# Patient Record
Sex: Female | Born: 1935
Health system: Southern US, Community
[De-identification: ages and names within clinical notes are randomized; demographics above are authoritative.]

## PROBLEM LIST (undated history)

## (undated) DIAGNOSIS — G629 Polyneuropathy, unspecified: Secondary | ICD-10-CM

## (undated) DIAGNOSIS — N649 Disorder of breast, unspecified: Secondary | ICD-10-CM

## (undated) DIAGNOSIS — K219 Gastro-esophageal reflux disease without esophagitis: Secondary | ICD-10-CM

## (undated) DIAGNOSIS — E785 Hyperlipidemia, unspecified: Secondary | ICD-10-CM

## (undated) DIAGNOSIS — M1990 Unspecified osteoarthritis, unspecified site: Secondary | ICD-10-CM

## (undated) DIAGNOSIS — C9110 Chronic lymphocytic leukemia of B-cell type not having achieved remission: Secondary | ICD-10-CM

## (undated) DIAGNOSIS — E7800 Pure hypercholesterolemia, unspecified: Secondary | ICD-10-CM

## (undated) DIAGNOSIS — D509 Iron deficiency anemia, unspecified: Secondary | ICD-10-CM

## (undated) DIAGNOSIS — E559 Vitamin D deficiency, unspecified: Secondary | ICD-10-CM

## (undated) DIAGNOSIS — R9439 Abnormal result of other cardiovascular function study: Secondary | ICD-10-CM

## (undated) DIAGNOSIS — N281 Cyst of kidney, acquired: Secondary | ICD-10-CM

## (undated) DIAGNOSIS — K5790 Diverticulosis of intestine, part unspecified, without perforation or abscess without bleeding: Secondary | ICD-10-CM

## (undated) DIAGNOSIS — N289 Disorder of kidney and ureter, unspecified: Secondary | ICD-10-CM

## (undated) DIAGNOSIS — M6780 Other specified disorders of synovium and tendon, unspecified site: Secondary | ICD-10-CM

## (undated) DIAGNOSIS — R3129 Other microscopic hematuria: Secondary | ICD-10-CM

## (undated) DIAGNOSIS — H9190 Unspecified hearing loss, unspecified ear: Secondary | ICD-10-CM

## (undated) DIAGNOSIS — M8580 Other specified disorders of bone density and structure, unspecified site: Secondary | ICD-10-CM

## (undated) DIAGNOSIS — K635 Polyp of colon: Secondary | ICD-10-CM

## (undated) HISTORY — DX: Iron deficiency anemia, unspecified: D50.9

## (undated) HISTORY — DX: Pure hypercholesterolemia, unspecified: E78.00

## (undated) HISTORY — DX: Abnormal result of other cardiovascular function study: R94.39

## (undated) HISTORY — DX: Cyst of kidney, acquired: N28.1

## (undated) HISTORY — DX: Other specified disorders of synovium and tendon, unspecified site: M67.80

## (undated) HISTORY — DX: Other specified disorders of bone density and structure, unspecified site: M85.80

## (undated) HISTORY — DX: Hyperlipidemia, unspecified: E78.5

## (undated) HISTORY — DX: Other microscopic hematuria: R31.29

## (undated) HISTORY — DX: Polyp of colon: K63.5

## (undated) HISTORY — DX: Polyneuropathy, unspecified: G62.9

## (undated) HISTORY — DX: Disorder of kidney and ureter, unspecified: N28.9

## (undated) HISTORY — DX: Vitamin D deficiency, unspecified: E55.9

## (undated) HISTORY — DX: Diverticulosis of intestine, part unspecified, without perforation or abscess without bleeding: K57.90

## (undated) HISTORY — DX: Unspecified osteoarthritis, unspecified site: M19.90

## (undated) HISTORY — DX: Gastro-esophageal reflux disease without esophagitis: K21.9

## (undated) HISTORY — DX: Unspecified hearing loss, unspecified ear: H91.90

## (undated) HISTORY — DX: Disorder of breast, unspecified: N64.9

## (undated) HISTORY — DX: Chronic lymphocytic leukemia of B-cell type not having achieved remission: C91.10

---

## 1999-03-31 ENCOUNTER — Ambulatory Visit (HOSPITAL_COMMUNITY): Admission: RE | Admit: 1999-03-31 | Discharge: 1999-03-31 | Payer: Self-pay | Admitting: Gastroenterology

## 2009-01-26 DIAGNOSIS — R9439 Abnormal result of other cardiovascular function study: Secondary | ICD-10-CM

## 2009-01-26 HISTORY — DX: Abnormal result of other cardiovascular function study: R94.39

## 2009-10-12 DIAGNOSIS — N281 Cyst of kidney, acquired: Secondary | ICD-10-CM

## 2009-10-12 DIAGNOSIS — C9110 Chronic lymphocytic leukemia of B-cell type not having achieved remission: Secondary | ICD-10-CM

## 2009-10-12 HISTORY — DX: Cyst of kidney, acquired: N28.1

## 2009-10-12 HISTORY — DX: Chronic lymphocytic leukemia of B-cell type not having achieved remission: C91.10

## 2010-04-11 ENCOUNTER — Inpatient Hospital Stay (HOSPITAL_COMMUNITY): Admission: RE | Admit: 2010-04-11 | Discharge: 2010-04-14 | Payer: Self-pay | Admitting: Orthopedic Surgery

## 2010-05-14 DIAGNOSIS — I5032 Chronic diastolic (congestive) heart failure: Secondary | ICD-10-CM | POA: Diagnosis present

## 2010-05-14 DIAGNOSIS — D509 Iron deficiency anemia, unspecified: Secondary | ICD-10-CM

## 2010-05-14 HISTORY — DX: Iron deficiency anemia, unspecified: D50.9

## 2010-10-27 LAB — CBC
MCHC: 34.4 g/dL (ref 30.0–36.0)
MCV: 94.1 fL (ref 78.0–100.0)
Platelets: 239 10*3/uL (ref 150–400)
RDW: 13.9 % (ref 11.5–15.5)
WBC: 13.9 10*3/uL — ABNORMAL HIGH (ref 4.0–10.5)

## 2010-10-28 LAB — COMPREHENSIVE METABOLIC PANEL
BUN: 14 mg/dL (ref 6–23)
CO2: 27 mEq/L (ref 19–32)
Chloride: 105 mEq/L (ref 96–112)
Creatinine, Ser: 1.19 mg/dL (ref 0.4–1.2)
GFR calc non Af Amer: 43 mL/min — ABNORMAL LOW (ref >60)
Glucose, Bld: 89 mg/dL (ref 70–99)
Total Bilirubin: 0.8 mg/dL (ref 0.3–1.2)

## 2010-10-28 LAB — BASIC METABOLIC PANEL
CO2: 28 mEq/L (ref 19–32)
Calcium: 8.8 mg/dL (ref 8.4–10.5)
Calcium: 8.9 mg/dL (ref 8.4–10.5)
Chloride: 107 mEq/L (ref 96–112)
GFR calc Af Amer: >60 mL/min (ref >60)
GFR calc Af Amer: >60 mL/min (ref >60)
GFR calc non Af Amer: 56 mL/min — ABNORMAL LOW (ref >60)
Glucose, Bld: 125 mg/dL — ABNORMAL HIGH (ref 70–99)
Potassium: 3.9 mEq/L (ref 3.5–5.1)
Sodium: 139 mEq/L (ref 135–145)
Sodium: 140 mEq/L (ref 135–145)

## 2010-10-28 LAB — CBC
HCT: 27.2 % — ABNORMAL LOW (ref 36.0–46.0)
Hemoglobin: 10.1 g/dL — ABNORMAL LOW (ref 12.0–15.0)
Hemoglobin: 9.2 g/dL — ABNORMAL LOW (ref 12.0–15.0)
MCH: 32.2 pg (ref 26.0–34.0)
MCHC: 34 g/dL (ref 30.0–36.0)
MCV: 94.3 fL (ref 78.0–100.0)
RBC: 2.88 MIL/uL — ABNORMAL LOW (ref 3.87–5.11)
RBC: 3.13 MIL/uL — ABNORMAL LOW (ref 3.87–5.11)
WBC: 12.7 10*3/uL — ABNORMAL HIGH (ref 4.0–10.5)

## 2010-10-28 LAB — URINALYSIS, ROUTINE W REFLEX MICROSCOPIC
Bilirubin Urine: NEGATIVE
Glucose, UA: NEGATIVE mg/dL
Hgb urine dipstick: NEGATIVE
Ketones, ur: NEGATIVE mg/dL
Protein, ur: NEGATIVE mg/dL

## 2010-10-28 LAB — PROTIME-INR
INR: 1 (ref 0.00–1.49)
INR: 1.53 — ABNORMAL HIGH (ref 0.00–1.49)
Prothrombin Time: 13.8 seconds (ref 11.6–15.2)
Prothrombin Time: 18.6 seconds — ABNORMAL HIGH (ref 11.6–15.2)

## 2010-10-28 LAB — ABO/RH: ABO/RH(D): B POS

## 2010-10-28 LAB — APTT: aPTT: 27 seconds (ref 24–37)

## 2011-04-27 ENCOUNTER — Other Ambulatory Visit: Payer: Self-pay | Admitting: Geriatric Medicine

## 2011-04-27 DIAGNOSIS — Z1231 Encounter for screening mammogram for malignant neoplasm of breast: Secondary | ICD-10-CM

## 2011-05-30 ENCOUNTER — Other Ambulatory Visit (HOSPITAL_COMMUNITY)
Admission: RE | Admit: 2011-05-30 | Discharge: 2011-05-30 | Disposition: A | Discharge status: Home / Self Care | Payer: Medicare Other | Source: Ambulatory Visit | Attending: Obstetrics and Gynecology | Admitting: Obstetrics and Gynecology

## 2011-05-30 DIAGNOSIS — Z124 Encounter for screening for malignant neoplasm of cervix: Secondary | ICD-10-CM | POA: Insufficient documentation

## 2011-06-01 ENCOUNTER — Ambulatory Visit
Admission: RE | Admit: 2011-06-01 | Discharge: 2011-06-01 | Disposition: A | Discharge status: Home / Self Care | Payer: Medicare Other | Source: Ambulatory Visit | Attending: Geriatric Medicine | Admitting: Geriatric Medicine

## 2011-06-01 DIAGNOSIS — Z1231 Encounter for screening mammogram for malignant neoplasm of breast: Secondary | ICD-10-CM

## 2011-06-06 ENCOUNTER — Other Ambulatory Visit: Payer: Self-pay | Admitting: Geriatric Medicine

## 2011-06-06 DIAGNOSIS — R928 Other abnormal and inconclusive findings on diagnostic imaging of breast: Secondary | ICD-10-CM

## 2011-06-30 ENCOUNTER — Ambulatory Visit
Admission: RE | Admit: 2011-06-30 | Discharge: 2011-06-30 | Disposition: A | Discharge status: Home / Self Care | Payer: Medicare Other | Source: Ambulatory Visit | Attending: Geriatric Medicine | Admitting: Geriatric Medicine

## 2011-06-30 DIAGNOSIS — R928 Other abnormal and inconclusive findings on diagnostic imaging of breast: Secondary | ICD-10-CM

## 2011-08-20 DIAGNOSIS — B9789 Other viral agents as the cause of diseases classified elsewhere: Secondary | ICD-10-CM | POA: Diagnosis not present

## 2011-09-01 DIAGNOSIS — M1710 Unilateral primary osteoarthritis, unspecified knee: Secondary | ICD-10-CM | POA: Diagnosis not present

## 2011-10-13 DIAGNOSIS — E559 Vitamin D deficiency, unspecified: Secondary | ICD-10-CM

## 2011-10-13 HISTORY — DX: Vitamin D deficiency, unspecified: E55.9

## 2011-10-24 DIAGNOSIS — C9110 Chronic lymphocytic leukemia of B-cell type not having achieved remission: Secondary | ICD-10-CM | POA: Diagnosis not present

## 2011-10-24 DIAGNOSIS — R109 Unspecified abdominal pain: Secondary | ICD-10-CM | POA: Diagnosis not present

## 2011-10-24 DIAGNOSIS — K219 Gastro-esophageal reflux disease without esophagitis: Secondary | ICD-10-CM | POA: Diagnosis not present

## 2011-10-24 DIAGNOSIS — K6289 Other specified diseases of anus and rectum: Secondary | ICD-10-CM | POA: Diagnosis not present

## 2011-10-24 DIAGNOSIS — Z79899 Other long term (current) drug therapy: Secondary | ICD-10-CM | POA: Diagnosis not present

## 2011-10-31 DIAGNOSIS — R5381 Other malaise: Secondary | ICD-10-CM | POA: Diagnosis not present

## 2011-10-31 DIAGNOSIS — IMO0001 Reserved for inherently not codable concepts without codable children: Secondary | ICD-10-CM | POA: Diagnosis not present

## 2011-11-08 DIAGNOSIS — M1710 Unilateral primary osteoarthritis, unspecified knee: Secondary | ICD-10-CM | POA: Diagnosis not present

## 2011-11-27 DIAGNOSIS — Z23 Encounter for immunization: Secondary | ICD-10-CM | POA: Diagnosis not present

## 2011-11-27 DIAGNOSIS — R05 Cough: Secondary | ICD-10-CM | POA: Diagnosis not present

## 2011-11-27 DIAGNOSIS — R079 Chest pain, unspecified: Secondary | ICD-10-CM | POA: Diagnosis not present

## 2011-11-27 DIAGNOSIS — K117 Disturbances of salivary secretion: Secondary | ICD-10-CM | POA: Diagnosis not present

## 2011-11-27 DIAGNOSIS — E7800 Pure hypercholesterolemia, unspecified: Secondary | ICD-10-CM | POA: Diagnosis not present

## 2011-11-27 DIAGNOSIS — R5383 Other fatigue: Secondary | ICD-10-CM | POA: Diagnosis not present

## 2011-11-27 DIAGNOSIS — R002 Palpitations: Secondary | ICD-10-CM | POA: Diagnosis not present

## 2011-12-13 DIAGNOSIS — I4949 Other premature depolarization: Secondary | ICD-10-CM | POA: Diagnosis not present

## 2011-12-13 DIAGNOSIS — E7800 Pure hypercholesterolemia, unspecified: Secondary | ICD-10-CM | POA: Diagnosis not present

## 2011-12-13 DIAGNOSIS — K219 Gastro-esophageal reflux disease without esophagitis: Secondary | ICD-10-CM | POA: Diagnosis not present

## 2011-12-13 DIAGNOSIS — R079 Chest pain, unspecified: Secondary | ICD-10-CM | POA: Diagnosis not present

## 2011-12-19 DIAGNOSIS — I4949 Other premature depolarization: Secondary | ICD-10-CM | POA: Diagnosis not present

## 2011-12-19 DIAGNOSIS — R079 Chest pain, unspecified: Secondary | ICD-10-CM | POA: Diagnosis not present

## 2011-12-21 ENCOUNTER — Other Ambulatory Visit: Payer: Self-pay | Admitting: Interventional Cardiology

## 2011-12-21 ENCOUNTER — Encounter (HOSPITAL_COMMUNITY): Payer: Self-pay | Admitting: Pharmacy Technician

## 2011-12-21 DIAGNOSIS — R079 Chest pain, unspecified: Secondary | ICD-10-CM | POA: Diagnosis not present

## 2011-12-21 DIAGNOSIS — I4949 Other premature depolarization: Secondary | ICD-10-CM | POA: Diagnosis not present

## 2011-12-21 DIAGNOSIS — R9430 Abnormal result of cardiovascular function study, unspecified: Secondary | ICD-10-CM | POA: Diagnosis not present

## 2011-12-25 ENCOUNTER — Encounter (HOSPITAL_COMMUNITY)
Admission: RE | Disposition: A | Discharge status: Home / Self Care | Payer: Self-pay | Source: Ambulatory Visit | Attending: Interventional Cardiology

## 2011-12-25 ENCOUNTER — Ambulatory Visit (HOSPITAL_COMMUNITY)
Admission: RE | Admit: 2011-12-25 | Discharge: 2011-12-25 | Disposition: A | Discharge status: Home / Self Care | Payer: Medicare Other | Source: Ambulatory Visit | Attending: Interventional Cardiology | Admitting: Interventional Cardiology

## 2011-12-25 DIAGNOSIS — R0609 Other forms of dyspnea: Secondary | ICD-10-CM | POA: Insufficient documentation

## 2011-12-25 DIAGNOSIS — K219 Gastro-esophageal reflux disease without esophagitis: Secondary | ICD-10-CM | POA: Diagnosis not present

## 2011-12-25 DIAGNOSIS — R0989 Other specified symptoms and signs involving the circulatory and respiratory systems: Secondary | ICD-10-CM | POA: Insufficient documentation

## 2011-12-25 DIAGNOSIS — E7800 Pure hypercholesterolemia, unspecified: Secondary | ICD-10-CM | POA: Insufficient documentation

## 2011-12-25 DIAGNOSIS — R0789 Other chest pain: Secondary | ICD-10-CM | POA: Diagnosis not present

## 2011-12-25 DIAGNOSIS — I4949 Other premature depolarization: Secondary | ICD-10-CM | POA: Insufficient documentation

## 2011-12-25 DIAGNOSIS — I200 Unstable angina: Secondary | ICD-10-CM | POA: Diagnosis not present

## 2011-12-25 DIAGNOSIS — I209 Angina pectoris, unspecified: Secondary | ICD-10-CM | POA: Diagnosis present

## 2011-12-25 DIAGNOSIS — I2510 Atherosclerotic heart disease of native coronary artery without angina pectoris: Secondary | ICD-10-CM | POA: Insufficient documentation

## 2011-12-25 DIAGNOSIS — R9430 Abnormal result of cardiovascular function study, unspecified: Secondary | ICD-10-CM | POA: Diagnosis not present

## 2011-12-25 HISTORY — PX: LEFT HEART CATHETERIZATION WITH CORONARY ANGIOGRAM: SHX5451

## 2011-12-25 SURGERY — LEFT HEART CATHETERIZATION WITH CORONARY ANGIOGRAM
Anesthesia: LOCAL

## 2011-12-25 MED ORDER — ONDANSETRON HCL 4 MG/2ML IJ SOLN: 4.0000 mg | Freq: Four times a day (QID) | INTRAMUSCULAR | Status: DC | PRN

## 2011-12-25 MED ORDER — SODIUM CHLORIDE 0.9 % IV SOLN
INTRAVENOUS | Status: DC
  Administered 2011-12-25 (×2): 1000 mL via INTRAVENOUS

## 2011-12-25 MED ORDER — HEPARIN (PORCINE) IN NACL 2-0.9 UNIT/ML-% IJ SOLN
INTRAMUSCULAR | Status: AC
  Filled 2011-12-25: qty 2000

## 2011-12-25 MED ORDER — FENTANYL CITRATE 0.05 MG/ML IJ SOLN
INTRAMUSCULAR | Status: AC
  Filled 2011-12-25: qty 2

## 2011-12-25 MED ORDER — SODIUM CHLORIDE 0.9 % IV SOLN: INTRAVENOUS | Status: AC

## 2011-12-25 MED ORDER — NITROGLYCERIN 0.4 MG SL SUBL
0.4000 mg | SUBLINGUAL_TABLET | SUBLINGUAL | Status: DC | PRN
  Filled 2011-12-25: qty 25

## 2011-12-25 MED ORDER — SODIUM CHLORIDE 0.9 % IJ SOLN: 3.0000 mL | INTRAMUSCULAR | Status: DC | PRN

## 2011-12-25 MED ORDER — ASPIRIN 81 MG PO CHEW
324.0000 mg | CHEWABLE_TABLET | ORAL | Status: AC
  Administered 2011-12-25: 324 mg via ORAL
  Filled 2011-12-25: qty 4

## 2011-12-25 MED ORDER — MIDAZOLAM HCL 2 MG/2ML IJ SOLN
INTRAMUSCULAR | Status: AC
  Filled 2011-12-25: qty 2

## 2011-12-25 MED ORDER — PANTOPRAZOLE SODIUM 40 MG PO TBEC: 40.0000 mg | DELAYED_RELEASE_TABLET | Freq: Every day | ORAL | Status: DC

## 2011-12-25 MED ORDER — ATORVASTATIN CALCIUM 10 MG PO TABS: 10.0000 mg | ORAL_TABLET | Freq: Every day | ORAL | Status: DC

## 2011-12-25 MED ORDER — ISOSORBIDE MONONITRATE ER 60 MG PO TB24: 60.0000 mg | ORAL_TABLET | Freq: Every day | ORAL | Status: DC

## 2011-12-25 MED ORDER — LIDOCAINE HCL (PF) 1 % IJ SOLN
INTRAMUSCULAR | Status: AC
  Filled 2011-12-25: qty 30

## 2011-12-25 MED ORDER — ACETAMINOPHEN 325 MG PO TABS: 650.0000 mg | ORAL_TABLET | ORAL | Status: DC | PRN

## 2011-12-25 MED ORDER — DIAZEPAM 5 MG PO TABS
5.0000 mg | ORAL_TABLET | ORAL | Status: AC
  Administered 2011-12-25: 5 mg via ORAL
  Filled 2011-12-25: qty 1

## 2011-12-25 MED ORDER — SODIUM CHLORIDE 0.9 % IJ SOLN: 3.0000 mL | Freq: Two times a day (BID) | INTRAMUSCULAR | Status: DC

## 2011-12-25 MED ORDER — VITAMIN C 500 MG PO TABS
500.0000 mg | ORAL_TABLET | Freq: Every day | ORAL | Status: DC
  Filled 2011-12-25: qty 1

## 2011-12-25 MED ORDER — NITROGLYCERIN 0.2 MG/ML ON CALL CATH LAB
INTRAVENOUS | Status: AC
  Filled 2011-12-25: qty 1

## 2011-12-25 MED ORDER — ASPIRIN EC 325 MG PO TBEC: 325.0000 mg | DELAYED_RELEASE_TABLET | Freq: Every day | ORAL | Status: DC

## 2011-12-25 MED ORDER — ERGOCALCIFEROL 8000 UNIT/ML PO SOLN
2000.0000 [IU] | Freq: Every day | ORAL | Status: DC
  Filled 2011-12-25: qty 0.25

## 2011-12-25 MED ORDER — SODIUM CHLORIDE 0.9 % IV SOLN: 250.0000 mL | INTRAVENOUS | Status: DC | PRN

## 2011-12-25 MED ORDER — HEPARIN SODIUM (PORCINE) 1000 UNIT/ML IJ SOLN
INTRAMUSCULAR | Status: AC
  Filled 2011-12-25: qty 1

## 2011-12-25 NOTE — H&P (Addendum)
He is referred to the admitting history and physical that a scan done from office notes. The patient had a catheterization performed greater than 20 years ago that did not reveal evidence of significant coronary disease at that time. Over the past several months she is had pressure in the chest and dyspnea upon awakening most mornings. Activity aggravates the discomfort. The nuclear study performed demonstrated apical ischemia. Isosorbide mononitrate was started after the nuclear study and has significantly improved her symptoms. She is also use supplemental nitroglycerin with relief. The catheterization is being done to identify the presence of coronary disease and help to guide therapy. The procedure including its risks of stroke, death, heart attack, bleeding, limb ischemia, allergy, kidney impairment, emergency bypass surgery, were discussed in detail with the patient and her husband. She accepts procedure and is willing to proceed to

## 2011-12-25 NOTE — CV Procedure (Signed)
     Diagnostic Cardiac Catheterization Report  Rebecca King  78 y.o.  female [birth year]  Procedure Date:12/25/2011 Referring Physician: Merlene Laughter, MD  Primary Cardiologist:: H.W. Flo Shanks, III, MD   PROCEDURE:  Left heart catheterization with selective coronary angiography, left ventriculogram.  INDICATIONS: There are recurring episodes of chest tightness predominantly in the morning. Isosorbide mononitrate and nitroglycerin partially relieves the discomfort. A recent nuclear study demonstrated antero-apical ischemia .  The risks, benefits, and details of the procedure were explained to the patient.  The patient verbalized understanding and wanted to proceed.  Informed written consent was obtained.  PROCEDURE TECHNIQUE:  After Xylocaine anesthesia a 5 sheath was placed in the right radial artery with a single anterior needle wall stick.   Coronary angiography was done using a 5 [place] A2 multipurpose, JR 4, JL 3.5, and 5 French 3.0 EBU catheters.  Left ventriculography was done using a 5 [place] A2 MP catheter.    CONTRAST:  Total of 140 cc.  COMPLICATIONS:  None.    HEMODYNAMICS:  Aortic pressure was 105/54 mmHg; LV pressure was 107/6 mmHg; LVEDP 11 mm mercury.  There was no gradient between the left ventricle and aorta.    ANGIOGRAPHIC DATA:   The left main coronary artery is widely patent.  The left anterior descending artery is widely patent with luminal irregularities up to 40% in the mid vessel beyond the origin of the first diagonal. The first diagonal is large and contains ostial 60-70% narrowing. The diagonal is large and trifurcates on the anterolateral wall.  The left circumflex artery is moderate in size given origin to 2 marginals. Proximal 50% eccentric narrowing is noted..  The right coronary artery is dominant. Gives origin to twin PDA system. No obstruction is noted in the right coronary.Marland Kitchen  LEFT VENTRICULOGRAM:  Left ventricular angiogram was done in the 30  RAO projection and revealed normal left ventricular wall motion and systolic function with an estimated ejection fraction of 60 %.    IMPRESSIONS:  1. 50-70% ostial diagonal. The diagonal is large in distribution and diameter.  2. Widely patent left main, LAD, circumflex, and RCA.  3. Normal LV function   RECOMMENDATION:  1. Continue anti-ischemic therapy with isosorbide.  2. Aspirin  3. Statin therapy  4. PCI could be performed on the ostium of the diagonal with cutting balloon if symptoms warrant. At this time however it appears clear-cut that medical therapy should be used. Also, I have some question about whether or not all of her symptoms or any of the symptoms could be related to the diagonal lesion. There is no evidence of anterolateral ischemia. The ischemia on the nuclear study was in the anteroapical segment. Should intervention becomes necessary however recommend consideration of the femoral approach due to the tortuosity and the patient's subclavian innominate artery distribution.Marland Kitchen

## 2011-12-25 NOTE — Discharge Instructions (Signed)
Radial Site Care Refer to this sheet in the next few weeks. These instructions provide you with information on caring for yourself after your procedure. Your caregiver may also give you more specific instructions. Your treatment has been planned according to current medical practices, but problems sometimes occur. Call your caregiver if you have any problems or questions after your procedure. HOME CARE INSTRUCTIONS  You may shower the day after the procedure.Remove the bandage (dressing) and gently wash the site with plain soap and water.Gently pat the site dry.   Do not apply powder or lotion to the site.   Do not submerge the affected site in water for 3 to 5 days.   Inspect the site at least twice daily.   Do not flex or bend the affected arm for 24 hours.   No lifting over 5 pounds (2.3 kg) for 5 days after your procedure.   Do not drive home if you are discharged the same day of the procedure. Have someone else drive you.   You may drive 24 hours after the procedure unless otherwise instructed by your caregiver.   Do not operate machinery or power tools for 24 hours.   A responsible adult should be with you for the first 24 hours after you arrive home.  What to expect:  Any bruising will usually fade within 1 to 2 weeks.   Blood that collects in the tissue (hematoma) may be painful to the touch. It should usually decrease in size and tenderness within 1 to 2 weeks.  SEEK IMMEDIATE MEDICAL CARE IF:  You have unusual pain at the radial site.   You have redness, warmth, swelling, or pain at the radial site.   You have drainage (other than a small amount of blood on the dressing).   You have chills.   You have a fever or persistent symptoms for more than 72 hours.   You have a fever and your symptoms suddenly get worse.   Your arm becomes pale, cool, tingly, or numb.   You have heavy bleeding from the site. Hold pressure on the site.  Document Released: 09/02/2010  Document Revised: 07/20/2011 Document Reviewed: 09/02/2010 Exit[employer] Patient Information 2012 [employer]. 

## 2011-12-25 NOTE — Progress Notes (Signed)
3 cc air returned into TR Band d/t oozing and small hematoma formation.

## 2011-12-26 MED FILL — Nicardipine HCl IV Soln 2.5 MG/ML: INTRAVENOUS | Qty: 1 | Status: AC

## 2011-12-28 DIAGNOSIS — R1310 Dysphagia, unspecified: Secondary | ICD-10-CM | POA: Diagnosis not present

## 2011-12-28 DIAGNOSIS — I2510 Atherosclerotic heart disease of native coronary artery without angina pectoris: Secondary | ICD-10-CM | POA: Diagnosis not present

## 2011-12-28 DIAGNOSIS — Z79899 Other long term (current) drug therapy: Secondary | ICD-10-CM | POA: Diagnosis not present

## 2011-12-28 DIAGNOSIS — E7800 Pure hypercholesterolemia, unspecified: Secondary | ICD-10-CM | POA: Diagnosis not present

## 2011-12-28 DIAGNOSIS — R5381 Other malaise: Secondary | ICD-10-CM | POA: Diagnosis not present

## 2011-12-28 DIAGNOSIS — K219 Gastro-esophageal reflux disease without esophagitis: Secondary | ICD-10-CM | POA: Diagnosis not present

## 2012-01-09 ENCOUNTER — Other Ambulatory Visit: Payer: Self-pay | Admitting: Geriatric Medicine

## 2012-01-09 DIAGNOSIS — N63 Unspecified lump in unspecified breast: Secondary | ICD-10-CM

## 2012-01-11 DIAGNOSIS — H2510 Age-related nuclear cataract, unspecified eye: Secondary | ICD-10-CM | POA: Diagnosis not present

## 2012-01-17 ENCOUNTER — Other Ambulatory Visit: Payer: Self-pay | Admitting: Geriatric Medicine

## 2012-01-17 ENCOUNTER — Ambulatory Visit
Admission: RE | Admit: 2012-01-17 | Discharge: 2012-01-17 | Disposition: A | Discharge status: Home / Self Care | Payer: Medicare Other | Source: Ambulatory Visit | Attending: Geriatric Medicine | Admitting: Geriatric Medicine

## 2012-01-17 DIAGNOSIS — N63 Unspecified lump in unspecified breast: Secondary | ICD-10-CM

## 2012-01-17 DIAGNOSIS — R928 Other abnormal and inconclusive findings on diagnostic imaging of breast: Secondary | ICD-10-CM | POA: Diagnosis not present

## 2012-01-17 DIAGNOSIS — N6489 Other specified disorders of breast: Secondary | ICD-10-CM | POA: Diagnosis not present

## 2012-01-19 DIAGNOSIS — M1710 Unilateral primary osteoarthritis, unspecified knee: Secondary | ICD-10-CM | POA: Diagnosis not present

## 2012-02-02 DIAGNOSIS — R5381 Other malaise: Secondary | ICD-10-CM | POA: Diagnosis not present

## 2012-02-02 DIAGNOSIS — E7800 Pure hypercholesterolemia, unspecified: Secondary | ICD-10-CM | POA: Diagnosis not present

## 2012-02-02 DIAGNOSIS — I2510 Atherosclerotic heart disease of native coronary artery without angina pectoris: Secondary | ICD-10-CM | POA: Diagnosis not present

## 2012-02-02 DIAGNOSIS — R1310 Dysphagia, unspecified: Secondary | ICD-10-CM | POA: Diagnosis not present

## 2012-02-02 DIAGNOSIS — K219 Gastro-esophageal reflux disease without esophagitis: Secondary | ICD-10-CM | POA: Diagnosis not present

## 2012-02-02 DIAGNOSIS — Z79899 Other long term (current) drug therapy: Secondary | ICD-10-CM | POA: Diagnosis not present

## 2012-02-02 DIAGNOSIS — R5383 Other fatigue: Secondary | ICD-10-CM | POA: Diagnosis not present

## 2012-02-13 DIAGNOSIS — R12 Heartburn: Secondary | ICD-10-CM | POA: Diagnosis not present

## 2012-02-13 DIAGNOSIS — K449 Diaphragmatic hernia without obstruction or gangrene: Secondary | ICD-10-CM | POA: Diagnosis not present

## 2012-02-13 DIAGNOSIS — R1310 Dysphagia, unspecified: Secondary | ICD-10-CM | POA: Diagnosis not present

## 2012-04-30 DIAGNOSIS — E7800 Pure hypercholesterolemia, unspecified: Secondary | ICD-10-CM | POA: Diagnosis not present

## 2012-04-30 DIAGNOSIS — Z1331 Encounter for screening for depression: Secondary | ICD-10-CM | POA: Diagnosis not present

## 2012-04-30 DIAGNOSIS — D649 Anemia, unspecified: Secondary | ICD-10-CM | POA: Diagnosis not present

## 2012-04-30 DIAGNOSIS — Z0000 Encounter for general adult medical examination without abnormal findings: Secondary | ICD-10-CM | POA: Diagnosis not present

## 2012-04-30 DIAGNOSIS — C9110 Chronic lymphocytic leukemia of B-cell type not having achieved remission: Secondary | ICD-10-CM | POA: Diagnosis not present

## 2012-04-30 DIAGNOSIS — Z79899 Other long term (current) drug therapy: Secondary | ICD-10-CM | POA: Diagnosis not present

## 2012-05-31 DIAGNOSIS — Z23 Encounter for immunization: Secondary | ICD-10-CM | POA: Diagnosis not present

## 2012-06-10 DIAGNOSIS — L821 Other seborrheic keratosis: Secondary | ICD-10-CM | POA: Diagnosis not present

## 2012-07-02 DIAGNOSIS — M1710 Unilateral primary osteoarthritis, unspecified knee: Secondary | ICD-10-CM | POA: Diagnosis not present

## 2012-08-20 DIAGNOSIS — E7800 Pure hypercholesterolemia, unspecified: Secondary | ICD-10-CM | POA: Diagnosis not present

## 2012-08-20 DIAGNOSIS — R109 Unspecified abdominal pain: Secondary | ICD-10-CM | POA: Diagnosis not present

## 2012-10-15 DIAGNOSIS — IMO0002 Reserved for concepts with insufficient information to code with codable children: Secondary | ICD-10-CM | POA: Diagnosis not present

## 2012-10-15 DIAGNOSIS — M5137 Other intervertebral disc degeneration, lumbosacral region: Secondary | ICD-10-CM | POA: Diagnosis not present

## 2012-10-15 DIAGNOSIS — M1710 Unilateral primary osteoarthritis, unspecified knee: Secondary | ICD-10-CM | POA: Diagnosis not present

## 2012-10-24 DIAGNOSIS — R109 Unspecified abdominal pain: Secondary | ICD-10-CM | POA: Diagnosis not present

## 2012-10-24 DIAGNOSIS — M549 Dorsalgia, unspecified: Secondary | ICD-10-CM | POA: Diagnosis not present

## 2012-11-01 ENCOUNTER — Emergency Department (HOSPITAL_COMMUNITY): Payer: Medicare Other

## 2012-11-01 ENCOUNTER — Emergency Department (HOSPITAL_COMMUNITY)
Admission: EM | Admit: 2012-11-01 | Discharge: 2012-11-01 | Disposition: A | Discharge status: Home / Self Care | Payer: Medicare Other | Attending: Emergency Medicine | Admitting: Emergency Medicine

## 2012-11-01 ENCOUNTER — Encounter (HOSPITAL_COMMUNITY): Payer: Self-pay | Admitting: Emergency Medicine

## 2012-11-01 DIAGNOSIS — D7289 Other specified disorders of white blood cells: Secondary | ICD-10-CM | POA: Diagnosis not present

## 2012-11-01 DIAGNOSIS — M1710 Unilateral primary osteoarthritis, unspecified knee: Secondary | ICD-10-CM | POA: Diagnosis not present

## 2012-11-01 DIAGNOSIS — R109 Unspecified abdominal pain: Secondary | ICD-10-CM | POA: Insufficient documentation

## 2012-11-01 DIAGNOSIS — K5900 Constipation, unspecified: Secondary | ICD-10-CM | POA: Insufficient documentation

## 2012-11-01 DIAGNOSIS — D72829 Elevated white blood cell count, unspecified: Secondary | ICD-10-CM | POA: Diagnosis not present

## 2012-11-01 DIAGNOSIS — Z79899 Other long term (current) drug therapy: Secondary | ICD-10-CM | POA: Insufficient documentation

## 2012-11-01 DIAGNOSIS — E871 Hypo-osmolality and hyponatremia: Secondary | ICD-10-CM | POA: Insufficient documentation

## 2012-11-01 DIAGNOSIS — N281 Cyst of kidney, acquired: Secondary | ICD-10-CM | POA: Diagnosis not present

## 2012-11-01 LAB — CBC WITH DIFFERENTIAL/PLATELET
Basophils Relative: 0 % (ref 0–1)
Eosinophils Relative: 1 % (ref 0–5)
Hemoglobin: 13.3 g/dL (ref 12.0–15.0)
Lymphocytes Relative: 55 % — ABNORMAL HIGH (ref 12–46)
Neutrophils Relative %: 37 % — ABNORMAL LOW (ref 43–77)
RBC: 4.2 MIL/uL (ref 3.87–5.11)
WBC: 17.6 10*3/uL — ABNORMAL HIGH (ref 4.0–10.5)

## 2012-11-01 LAB — COMPREHENSIVE METABOLIC PANEL
ALT: 96 U/L — ABNORMAL HIGH (ref 0–35)
AST: 48 U/L — ABNORMAL HIGH (ref 0–37)
Alkaline Phosphatase: 124 U/L — ABNORMAL HIGH (ref 39–117)
CO2: 25 mEq/L (ref 19–32)
GFR calc Af Amer: 52 mL/min — ABNORMAL LOW (ref >90)
GFR calc non Af Amer: 45 mL/min — ABNORMAL LOW (ref >90)
Glucose, Bld: 118 mg/dL — ABNORMAL HIGH (ref 70–99)
Potassium: 4.1 mEq/L (ref 3.5–5.1)
Sodium: 129 mEq/L — ABNORMAL LOW (ref 135–145)
Total Protein: 6.7 g/dL (ref 6.0–8.3)

## 2012-11-01 LAB — URINE MICROSCOPIC-ADD ON

## 2012-11-01 LAB — URINALYSIS, ROUTINE W REFLEX MICROSCOPIC
Bilirubin Urine: NEGATIVE
Ketones, ur: NEGATIVE mg/dL
Nitrite: NEGATIVE
pH: 5 (ref 5.0–8.0)

## 2012-11-01 LAB — PATHOLOGIST SMEAR REVIEW

## 2012-11-01 MED ORDER — AMOXICILLIN-POT CLAVULANATE 875-125 MG PO TABS
1.0000 | ORAL_TABLET | Freq: Once | ORAL | Status: AC
  Administered 2012-11-01: 1 via ORAL
  Filled 2012-11-01: qty 1

## 2012-11-01 MED ORDER — IOHEXOL 300 MG/ML  SOLN
25.0000 mL | INTRAMUSCULAR | Status: AC
  Administered 2012-11-01: 25 mL via ORAL

## 2012-11-01 MED ORDER — IOHEXOL 300 MG/ML  SOLN
100.0000 mL | Freq: Once | INTRAMUSCULAR | Status: AC | PRN
  Administered 2012-11-01: 100 mL via INTRAVENOUS

## 2012-11-01 MED ORDER — SODIUM CHLORIDE 0.9 % IV BOLUS (SEPSIS)
500.0000 mL | Freq: Once | INTRAVENOUS | Status: AC
  Administered 2012-11-01: 500 mL via INTRAVENOUS

## 2012-11-01 MED ORDER — AMOXICILLIN-POT CLAVULANATE 875-125 MG PO TABS: 1.0000 | ORAL_TABLET | Freq: Two times a day (BID) | ORAL | Status: DC

## 2012-11-01 NOTE — ED Notes (Signed)
Radiology notified that patient has finished drinking contrast

## 2012-11-01 NOTE — ED Notes (Signed)
Patient resting on stretcher at this time. No acute distress noted. resp are even and unlabored. Call light within reach. Plan of care discussed with patient (ct scan). Will continue to monitor.

## 2012-11-01 NOTE — ED Notes (Signed)
Patient is resting at this time. No acute distress noted. Patient complaining of abd pain at this time, 7/10. Discussed plan of care waiting for ct results. Call light at bedside. Will continue to monitor.

## 2012-11-01 NOTE — ED Notes (Signed)
PT. REPORTS CONSTIPATION FOR SEVERAL DAYS WITH ABDOMINAL BLOATING/LATERAL ABDOMINAL PAIN , TRIED ENEMA X2 THIS EVENING WITH NO RELIEF.

## 2012-11-01 NOTE — ED Notes (Signed)
NAD noted at time of d/c home 

## 2012-11-01 NOTE — ED Notes (Signed)
Patient sitting on stretcher at this time. Patient is complaining of abd pain, bloating and constipation that began around 1830 last night. resp are even and unlabored. No acute distress noted. Call light within reach. Will continue to monitor.

## 2012-11-01 NOTE — ED Provider Notes (Addendum)
History     CSN: [id number]  Arrival date & time 11/01/12  0865   First MD Initiated Contact with Patient 11/01/12 0359      Chief Complaint  Patient presents with  . Constipation  . Abdominal Pain    (Consider location/radiation/quality/duration/timing/severity/associated sxs/prior treatment) Patient is a 79 y.o. female presenting with constipation and abdominal pain. The history is provided by the patient.  Constipation  The current episode started 3 to 5 days ago. The onset was gradual. The problem occurs rarely. The problem has been gradually worsening. The pain is mild. The stool is described as soft. Prior successful therapies include laxatives. There was no prior unsuccessful therapy. Associated symptoms include abdominal pain.  Abdominal Pain Associated symptoms: constipation     History reviewed. No pertinent past medical history.  History reviewed. No pertinent past surgical history.  No family history on file.  History  Substance Use Topics  . Smoking status: Never Smoker   . Smokeless tobacco: Not on file  . Alcohol Use: No    OB History   Grav Para Term Preterm Abortions TAB SAB Ect Mult Living                  Review of Systems  Gastrointestinal: Positive for abdominal pain and constipation.  All other systems reviewed and are negative.    Allergies  Crestor; Ciprofloxacin; and Sulfa antibiotics  Home Medications   Current Outpatient Rx  Name  Route  Sig  Dispense  Refill  . Ascorbic Acid (VITAMIN C PO)   Oral   Take 1 tablet by mouth daily.         Marland Kitchen BIOTIN PO   Oral   Take 1 tablet by mouth daily.         . Coenzyme Q10 (CO Q 10 PO)   Oral   Take 1 tablet by mouth daily.         . Ergocalciferol (CALCIFEROL PO)   Oral   Take 1 tablet by mouth daily.         . Glucosamine-Chondroitin (GLUCOSAMINE CHONDR COMPLEX PO)   Oral   Take 1 tablet by mouth daily.         Marland Kitchen KRILL OIL PO   Oral   Take 1 capsule by mouth  daily.         . Multiple Vitamin (MULITIVITAMIN WITH MINERALS) TABS   Oral   Take 1 tablet by mouth daily.         Marland Kitchen omeprazole (PRILOSEC) 20 MG capsule   Oral   Take 20 mg by mouth daily.         . rosuvastatin (CRESTOR) 5 MG tablet   Oral   Take 5 mg by mouth daily.         . traMADol (ULTRAM) 50 MG tablet   Oral   Take 100 mg by mouth every 6 (six) hours as needed for pain.           BP 123/71  Pulse 65  Temp(Src) 97.9 F (36.6 C) (Oral)  Resp 18  SpO2 98%  Physical Exam  Constitutional: She is oriented to person, place, and time. She appears well-developed and well-nourished.  HENT:  Head: Normocephalic and atraumatic.  Eyes: Conjunctivae and EOM are normal. Pupils are equal, round, and reactive to light.  Neck: Normal range of motion.  Cardiovascular: Normal rate, regular rhythm and normal heart sounds.   Pulmonary/Chest: Effort normal and breath sounds normal.  Abdominal: Soft.  Bowel sounds are normal. She exhibits distension.  Musculoskeletal: Normal range of motion.  No sxs of epidural compression or venous insufficiency  Neurological: She is alert and oriented to person, place, and time.  Skin: Skin is warm and dry.  Psychiatric: She has a normal mood and affect. Her behavior is normal.    ED Course  Procedures (including critical care time)  Labs Reviewed  CBC WITH DIFFERENTIAL - Abnormal; Notable for the following:    WBC 17.6 (*)    Neutrophils Relative 37 (*)    Lymphocytes Relative 55 (*)    Lymphs Abs 9.7 (*)    Monocytes Absolute 1.2 (*)    All other components within normal limits  COMPREHENSIVE METABOLIC PANEL - Abnormal; Notable for the following:    Sodium 129 (*)    Chloride 92 (*)    Glucose, Bld 118 (*)    AST 48 (*)    ALT 96 (*)    Alkaline Phosphatase 124 (*)    GFR calc non Af Amer 45 (*)    GFR calc Af Amer 52 (*)    All other components within normal limits  URINALYSIS, ROUTINE W REFLEX MICROSCOPIC   No results  found.   No diagnosis found.    MDM  ~18k white count, constipation, abd distension.  Will ct,  reassess  Ct neg.  Improved.  Will ivf, abx, dc to fu,  Ret new/worsneing sxs      Emil Klassen Lytle Michaels, MD 11/01/12 0455  Shashwat Cleary Lytle Michaels, MD 11/01/12 [phone]

## 2012-11-01 NOTE — ED Notes (Signed)
Patient ambulating to restroom without difficulty.

## 2012-11-07 DIAGNOSIS — M5137 Other intervertebral disc degeneration, lumbosacral region: Secondary | ICD-10-CM | POA: Diagnosis not present

## 2012-11-22 DIAGNOSIS — M5126 Other intervertebral disc displacement, lumbar region: Secondary | ICD-10-CM | POA: Diagnosis not present

## 2012-12-09 DIAGNOSIS — M5126 Other intervertebral disc displacement, lumbar region: Secondary | ICD-10-CM | POA: Diagnosis not present

## 2012-12-12 ENCOUNTER — Other Ambulatory Visit: Payer: Self-pay | Admitting: Orthopedic Surgery

## 2012-12-12 DIAGNOSIS — M5137 Other intervertebral disc degeneration, lumbosacral region: Secondary | ICD-10-CM

## 2012-12-12 DIAGNOSIS — M4800 Spinal stenosis, site unspecified: Secondary | ICD-10-CM

## 2012-12-12 DIAGNOSIS — M5126 Other intervertebral disc displacement, lumbar region: Secondary | ICD-10-CM

## 2012-12-19 ENCOUNTER — Other Ambulatory Visit: Payer: TRICARE For Life (TFL)

## 2012-12-19 ENCOUNTER — Inpatient Hospital Stay
Admission: RE | Admit: 2012-12-19 | Discharge: 2012-12-19 | Disposition: A | Discharge status: Home / Self Care | Payer: TRICARE For Life (TFL) | Source: Ambulatory Visit | Attending: Orthopedic Surgery | Admitting: Orthopedic Surgery

## 2012-12-26 ENCOUNTER — Ambulatory Visit
Admission: RE | Admit: 2012-12-26 | Discharge: 2012-12-26 | Disposition: A | Discharge status: Home / Self Care | Payer: Medicare Other | Source: Ambulatory Visit | Attending: Orthopedic Surgery | Admitting: Orthopedic Surgery

## 2012-12-26 ENCOUNTER — Ambulatory Visit
Admission: RE | Admit: 2012-12-26 | Discharge: 2012-12-26 | Disposition: A | Discharge status: Home / Self Care | Payer: TRICARE For Life (TFL) | Source: Ambulatory Visit | Attending: Orthopedic Surgery | Admitting: Orthopedic Surgery

## 2012-12-26 ENCOUNTER — Other Ambulatory Visit: Payer: Self-pay | Admitting: Orthopedic Surgery

## 2012-12-26 ENCOUNTER — Ambulatory Visit
Admission: RE | Admit: 2012-12-26 | Discharge: 2012-12-26 | Disposition: A | Discharge status: Home / Self Care | Payer: Self-pay | Source: Ambulatory Visit | Attending: Orthopedic Surgery | Admitting: Orthopedic Surgery

## 2012-12-26 VITALS — BP 105/53 | HR 68

## 2012-12-26 DIAGNOSIS — M5126 Other intervertebral disc displacement, lumbar region: Secondary | ICD-10-CM

## 2012-12-26 DIAGNOSIS — M5137 Other intervertebral disc degeneration, lumbosacral region: Secondary | ICD-10-CM

## 2012-12-26 DIAGNOSIS — M549 Dorsalgia, unspecified: Secondary | ICD-10-CM

## 2012-12-26 DIAGNOSIS — M4800 Spinal stenosis, site unspecified: Secondary | ICD-10-CM

## 2012-12-26 DIAGNOSIS — M47817 Spondylosis without myelopathy or radiculopathy, lumbosacral region: Secondary | ICD-10-CM | POA: Diagnosis not present

## 2012-12-26 MED ORDER — IOHEXOL 180 MG/ML  SOLN
18.0000 mL | Freq: Once | INTRAMUSCULAR | Status: AC | PRN
  Administered 2012-12-26: 18 mL via INTRATHECAL

## 2012-12-26 MED ORDER — MEPERIDINE HCL 100 MG/ML IJ SOLN
75.0000 mg | Freq: Once | INTRAMUSCULAR | Status: AC
  Administered 2012-12-26: 75 mg via INTRAMUSCULAR

## 2012-12-26 MED ORDER — ONDANSETRON HCL 4 MG/2ML IJ SOLN
4.0000 mg | Freq: Once | INTRAMUSCULAR | Status: AC
  Administered 2012-12-26: 4 mg via INTRAMUSCULAR

## 2012-12-26 MED ORDER — DIAZEPAM 5 MG PO TABS
5.0000 mg | ORAL_TABLET | Freq: Once | ORAL | Status: AC
  Administered 2012-12-26: 5 mg via ORAL

## 2012-12-26 NOTE — Progress Notes (Signed)
Pt states she has been off tramadol for a week. Discharge instructions explained.

## 2012-12-26 NOTE — Discharge Instructions (Signed)
Myelogram Discharge Instructions  1. Go home and rest quietly for the next 24 hours.  It is important to lie flat for the next 24 hours.  Get up only to go to the restroom.  You may lie in the bed or on a couch on your back, your stomach, your left side or your right side.  You may have one pillow under your head.  You may have pillows between your knees while you are on your side or under your knees while you are on your back.  2. DO NOT drive today.  Recline the seat as far back as it will go, while still wearing your seat belt, on the way home.  3. You may get up to go to the bathroom as needed.  You may sit up for 10 minutes to eat.  You may resume your normal diet and medications unless otherwise indicated.  Drink lots of extra fluids today and tomorrow.  4. The incidence of headache, nausea, or vomiting is about 5% (one in 20 patients).  If you develop a headache, lie flat and drink plenty of fluids until the headache goes away.  Caffeinated beverages may be helpful.  If you develop severe nausea and vomiting or a headache that does not go away with flat bed rest, call [phone].  5. You may resume normal activities after your 24 hours of bed rest is over; however, do not exert yourself strongly or do any heavy lifting tomorrow. If when you get up you have a headache when standing, go back to bed and force fluids for another 24 hours.  6. Call your physician for a follow-up appointment.  The results of your myelogram will be sent directly to your physician by the following day.  7. If you have any questions or if complications develop after you arrive home, please call [phone].  Discharge instructions have been explained to the patient.  The patient, or the person responsible for the patient, fully understands these instructions.      MAY RESUME TRAMADOL ON Dec 27, 2012, AFTER 1:00 PM.

## 2012-12-30 DIAGNOSIS — M5126 Other intervertebral disc displacement, lumbar region: Secondary | ICD-10-CM | POA: Diagnosis not present

## 2013-01-03 DIAGNOSIS — M5137 Other intervertebral disc degeneration, lumbosacral region: Secondary | ICD-10-CM | POA: Diagnosis not present

## 2013-01-03 DIAGNOSIS — M47817 Spondylosis without myelopathy or radiculopathy, lumbosacral region: Secondary | ICD-10-CM | POA: Diagnosis not present

## 2013-01-14 DIAGNOSIS — Z961 Presence of intraocular lens: Secondary | ICD-10-CM | POA: Diagnosis not present

## 2013-01-14 DIAGNOSIS — H2510 Age-related nuclear cataract, unspecified eye: Secondary | ICD-10-CM | POA: Diagnosis not present

## 2013-01-24 DIAGNOSIS — M5137 Other intervertebral disc degeneration, lumbosacral region: Secondary | ICD-10-CM | POA: Diagnosis not present

## 2013-01-24 DIAGNOSIS — M1710 Unilateral primary osteoarthritis, unspecified knee: Secondary | ICD-10-CM | POA: Diagnosis not present

## 2013-01-27 DIAGNOSIS — H1045 Other chronic allergic conjunctivitis: Secondary | ICD-10-CM | POA: Diagnosis not present

## 2013-01-31 DIAGNOSIS — Z96659 Presence of unspecified artificial knee joint: Secondary | ICD-10-CM | POA: Diagnosis not present

## 2013-05-01 DIAGNOSIS — E7800 Pure hypercholesterolemia, unspecified: Secondary | ICD-10-CM | POA: Diagnosis not present

## 2013-05-01 DIAGNOSIS — Z1331 Encounter for screening for depression: Secondary | ICD-10-CM | POA: Diagnosis not present

## 2013-05-01 DIAGNOSIS — C9110 Chronic lymphocytic leukemia of B-cell type not having achieved remission: Secondary | ICD-10-CM | POA: Diagnosis not present

## 2013-05-01 DIAGNOSIS — Z79899 Other long term (current) drug therapy: Secondary | ICD-10-CM | POA: Diagnosis not present

## 2013-05-01 DIAGNOSIS — Z0000 Encounter for general adult medical examination without abnormal findings: Secondary | ICD-10-CM | POA: Diagnosis not present

## 2013-05-20 DIAGNOSIS — Z01419 Encounter for gynecological examination (general) (routine) without abnormal findings: Secondary | ICD-10-CM | POA: Diagnosis not present

## 2013-05-23 ENCOUNTER — Other Ambulatory Visit: Payer: Self-pay

## 2013-05-23 DIAGNOSIS — Z1231 Encounter for screening mammogram for malignant neoplasm of breast: Secondary | ICD-10-CM

## 2013-05-26 DIAGNOSIS — M1710 Unilateral primary osteoarthritis, unspecified knee: Secondary | ICD-10-CM | POA: Diagnosis not present

## 2013-05-26 DIAGNOSIS — IMO0002 Reserved for concepts with insufficient information to code with codable children: Secondary | ICD-10-CM | POA: Diagnosis not present

## 2013-05-26 DIAGNOSIS — Z96659 Presence of unspecified artificial knee joint: Secondary | ICD-10-CM | POA: Diagnosis not present

## 2013-06-18 ENCOUNTER — Ambulatory Visit
Admission: RE | Admit: 2013-06-18 | Discharge: 2013-06-18 | Disposition: A | Discharge status: Home / Self Care | Payer: Medicare Other | Source: Ambulatory Visit

## 2013-06-18 DIAGNOSIS — Z1231 Encounter for screening mammogram for malignant neoplasm of breast: Secondary | ICD-10-CM

## 2013-07-23 DIAGNOSIS — M545 Low back pain: Secondary | ICD-10-CM | POA: Diagnosis not present

## 2013-08-28 DIAGNOSIS — IMO0002 Reserved for concepts with insufficient information to code with codable children: Secondary | ICD-10-CM | POA: Diagnosis not present

## 2013-08-28 DIAGNOSIS — M1710 Unilateral primary osteoarthritis, unspecified knee: Secondary | ICD-10-CM | POA: Diagnosis not present

## 2013-08-29 ENCOUNTER — Telehealth: Payer: Self-pay

## 2013-08-31 ENCOUNTER — Other Ambulatory Visit: Payer: Self-pay | Admitting: Interventional Cardiology

## 2013-09-02 DIAGNOSIS — K219 Gastro-esophageal reflux disease without esophagitis: Secondary | ICD-10-CM | POA: Diagnosis not present

## 2013-09-02 NOTE — Telephone Encounter (Signed)
REFILL 

## 2013-09-05 DIAGNOSIS — J111 Influenza due to unidentified influenza virus with other respiratory manifestations: Secondary | ICD-10-CM | POA: Diagnosis not present

## 2013-09-10 ENCOUNTER — Other Ambulatory Visit: Payer: Self-pay | Admitting: Gastroenterology

## 2013-09-10 DIAGNOSIS — R1310 Dysphagia, unspecified: Secondary | ICD-10-CM | POA: Diagnosis not present

## 2013-09-11 ENCOUNTER — Ambulatory Visit
Admission: RE | Admit: 2013-09-11 | Discharge: 2013-09-11 | Disposition: A | Discharge status: Home / Self Care | Payer: Medicare Other | Source: Ambulatory Visit | Attending: Gastroenterology | Admitting: Gastroenterology

## 2013-09-11 DIAGNOSIS — R1310 Dysphagia, unspecified: Secondary | ICD-10-CM

## 2013-09-11 DIAGNOSIS — K449 Diaphragmatic hernia without obstruction or gangrene: Secondary | ICD-10-CM | POA: Diagnosis not present

## 2013-09-11 DIAGNOSIS — K219 Gastro-esophageal reflux disease without esophagitis: Secondary | ICD-10-CM | POA: Diagnosis not present

## 2013-10-28 DIAGNOSIS — C9110 Chronic lymphocytic leukemia of B-cell type not having achieved remission: Secondary | ICD-10-CM | POA: Diagnosis not present

## 2013-10-28 DIAGNOSIS — M25569 Pain in unspecified knee: Secondary | ICD-10-CM | POA: Diagnosis not present

## 2013-10-28 DIAGNOSIS — Z23 Encounter for immunization: Secondary | ICD-10-CM | POA: Diagnosis not present

## 2013-11-13 DIAGNOSIS — IMO0002 Reserved for concepts with insufficient information to code with codable children: Secondary | ICD-10-CM | POA: Diagnosis not present

## 2013-11-13 DIAGNOSIS — M1710 Unilateral primary osteoarthritis, unspecified knee: Secondary | ICD-10-CM | POA: Diagnosis not present

## 2013-12-22 DIAGNOSIS — M65849 Other synovitis and tenosynovitis, unspecified hand: Secondary | ICD-10-CM | POA: Diagnosis not present

## 2013-12-22 DIAGNOSIS — R05 Cough: Secondary | ICD-10-CM | POA: Diagnosis not present

## 2013-12-22 DIAGNOSIS — R059 Cough, unspecified: Secondary | ICD-10-CM | POA: Diagnosis not present

## 2013-12-22 DIAGNOSIS — M65839 Other synovitis and tenosynovitis, unspecified forearm: Secondary | ICD-10-CM | POA: Diagnosis not present

## 2014-03-05 DIAGNOSIS — M25569 Pain in unspecified knee: Secondary | ICD-10-CM | POA: Diagnosis not present

## 2014-04-12 ENCOUNTER — Encounter: Payer: Self-pay | Admitting: *Deleted

## 2014-04-17 DIAGNOSIS — L0291 Cutaneous abscess, unspecified: Secondary | ICD-10-CM | POA: Diagnosis not present

## 2014-04-17 DIAGNOSIS — L0390 Cellulitis, unspecified: Secondary | ICD-10-CM | POA: Diagnosis not present

## 2014-04-17 DIAGNOSIS — L723 Sebaceous cyst: Secondary | ICD-10-CM | POA: Diagnosis not present

## 2014-04-17 DIAGNOSIS — L821 Other seborrheic keratosis: Secondary | ICD-10-CM | POA: Diagnosis not present

## 2014-05-04 DIAGNOSIS — Z79899 Other long term (current) drug therapy: Secondary | ICD-10-CM | POA: Diagnosis not present

## 2014-05-04 DIAGNOSIS — Z0000 Encounter for general adult medical examination without abnormal findings: Secondary | ICD-10-CM | POA: Diagnosis not present

## 2014-05-04 DIAGNOSIS — Z1331 Encounter for screening for depression: Secondary | ICD-10-CM | POA: Diagnosis not present

## 2014-05-04 DIAGNOSIS — Z23 Encounter for immunization: Secondary | ICD-10-CM | POA: Diagnosis not present

## 2014-05-04 DIAGNOSIS — R5381 Other malaise: Secondary | ICD-10-CM | POA: Diagnosis not present

## 2014-05-04 DIAGNOSIS — C9110 Chronic lymphocytic leukemia of B-cell type not having achieved remission: Secondary | ICD-10-CM | POA: Diagnosis not present

## 2014-05-04 DIAGNOSIS — E7800 Pure hypercholesterolemia, unspecified: Secondary | ICD-10-CM | POA: Diagnosis not present

## 2014-05-04 DIAGNOSIS — N183 Chronic kidney disease, stage 3 unspecified: Secondary | ICD-10-CM | POA: Diagnosis not present

## 2014-05-04 DIAGNOSIS — R5383 Other fatigue: Secondary | ICD-10-CM | POA: Diagnosis not present

## 2014-05-04 DIAGNOSIS — K219 Gastro-esophageal reflux disease without esophagitis: Secondary | ICD-10-CM | POA: Diagnosis not present

## 2014-05-06 DIAGNOSIS — C9110 Chronic lymphocytic leukemia of B-cell type not having achieved remission: Secondary | ICD-10-CM | POA: Diagnosis not present

## 2014-05-06 DIAGNOSIS — E7800 Pure hypercholesterolemia, unspecified: Secondary | ICD-10-CM | POA: Diagnosis not present

## 2014-05-06 DIAGNOSIS — R5381 Other malaise: Secondary | ICD-10-CM | POA: Diagnosis not present

## 2014-05-06 DIAGNOSIS — Z79899 Other long term (current) drug therapy: Secondary | ICD-10-CM | POA: Diagnosis not present

## 2014-05-06 DIAGNOSIS — R5383 Other fatigue: Secondary | ICD-10-CM | POA: Diagnosis not present

## 2014-05-15 ENCOUNTER — Other Ambulatory Visit: Payer: Self-pay

## 2014-05-15 DIAGNOSIS — Z1239 Encounter for other screening for malignant neoplasm of breast: Secondary | ICD-10-CM

## 2014-05-15 DIAGNOSIS — Z1231 Encounter for screening mammogram for malignant neoplasm of breast: Secondary | ICD-10-CM

## 2014-07-16 DIAGNOSIS — H2511 Age-related nuclear cataract, right eye: Secondary | ICD-10-CM | POA: Diagnosis not present

## 2014-07-16 DIAGNOSIS — Z961 Presence of intraocular lens: Secondary | ICD-10-CM | POA: Diagnosis not present

## 2014-07-21 DIAGNOSIS — M1712 Unilateral primary osteoarthritis, left knee: Secondary | ICD-10-CM | POA: Diagnosis not present

## 2014-07-23 ENCOUNTER — Encounter (HOSPITAL_COMMUNITY): Payer: Self-pay | Admitting: Interventional Cardiology

## 2014-11-03 DIAGNOSIS — R5383 Other fatigue: Secondary | ICD-10-CM | POA: Diagnosis not present

## 2014-11-03 DIAGNOSIS — C9110 Chronic lymphocytic leukemia of B-cell type not having achieved remission: Secondary | ICD-10-CM | POA: Diagnosis not present

## 2014-11-27 DIAGNOSIS — R5383 Other fatigue: Secondary | ICD-10-CM | POA: Diagnosis not present

## 2014-12-22 DIAGNOSIS — M1712 Unilateral primary osteoarthritis, left knee: Secondary | ICD-10-CM | POA: Diagnosis not present

## 2015-02-18 DIAGNOSIS — H01001 Unspecified blepharitis right upper eyelid: Secondary | ICD-10-CM | POA: Diagnosis not present

## 2015-05-06 DIAGNOSIS — M1712 Unilateral primary osteoarthritis, left knee: Secondary | ICD-10-CM | POA: Diagnosis not present

## 2015-05-07 DIAGNOSIS — Z23 Encounter for immunization: Secondary | ICD-10-CM | POA: Diagnosis not present

## 2015-05-07 DIAGNOSIS — Z79899 Other long term (current) drug therapy: Secondary | ICD-10-CM | POA: Diagnosis not present

## 2015-05-07 DIAGNOSIS — E780 Pure hypercholesterolemia: Secondary | ICD-10-CM | POA: Diagnosis not present

## 2015-05-07 DIAGNOSIS — M8589 Other specified disorders of bone density and structure, multiple sites: Secondary | ICD-10-CM | POA: Diagnosis not present

## 2015-05-07 DIAGNOSIS — G629 Polyneuropathy, unspecified: Secondary | ICD-10-CM | POA: Diagnosis not present

## 2015-05-07 DIAGNOSIS — C9110 Chronic lymphocytic leukemia of B-cell type not having achieved remission: Secondary | ICD-10-CM | POA: Diagnosis not present

## 2015-05-07 DIAGNOSIS — Z1389 Encounter for screening for other disorder: Secondary | ICD-10-CM | POA: Diagnosis not present

## 2015-05-07 DIAGNOSIS — Z0000 Encounter for general adult medical examination without abnormal findings: Secondary | ICD-10-CM | POA: Diagnosis not present

## 2015-05-07 DIAGNOSIS — N183 Chronic kidney disease, stage 3 (moderate): Secondary | ICD-10-CM | POA: Diagnosis not present

## 2015-05-07 DIAGNOSIS — R5383 Other fatigue: Secondary | ICD-10-CM | POA: Diagnosis not present

## 2015-05-11 DIAGNOSIS — E780 Pure hypercholesterolemia: Secondary | ICD-10-CM | POA: Diagnosis not present

## 2015-05-11 DIAGNOSIS — Z79899 Other long term (current) drug therapy: Secondary | ICD-10-CM | POA: Diagnosis not present

## 2015-05-11 DIAGNOSIS — R5383 Other fatigue: Secondary | ICD-10-CM | POA: Diagnosis not present

## 2015-05-11 DIAGNOSIS — C9110 Chronic lymphocytic leukemia of B-cell type not having achieved remission: Secondary | ICD-10-CM | POA: Diagnosis not present

## 2015-05-26 DIAGNOSIS — M8589 Other specified disorders of bone density and structure, multiple sites: Secondary | ICD-10-CM | POA: Diagnosis not present

## 2015-05-26 DIAGNOSIS — M859 Disorder of bone density and structure, unspecified: Secondary | ICD-10-CM | POA: Diagnosis not present

## 2015-05-27 DIAGNOSIS — H903 Sensorineural hearing loss, bilateral: Secondary | ICD-10-CM | POA: Diagnosis not present

## 2015-05-28 DIAGNOSIS — Z01419 Encounter for gynecological examination (general) (routine) without abnormal findings: Secondary | ICD-10-CM | POA: Diagnosis not present

## 2015-06-02 DIAGNOSIS — M5137 Other intervertebral disc degeneration, lumbosacral region: Secondary | ICD-10-CM | POA: Diagnosis not present

## 2015-06-09 DIAGNOSIS — Z23 Encounter for immunization: Secondary | ICD-10-CM | POA: Diagnosis not present

## 2015-06-16 ENCOUNTER — Ambulatory Visit: Payer: Medicare Other

## 2015-06-17 DIAGNOSIS — H6123 Impacted cerumen, bilateral: Secondary | ICD-10-CM | POA: Diagnosis not present

## 2015-06-17 DIAGNOSIS — H9042 Sensorineural hearing loss, unilateral, left ear, with unrestricted hearing on the contralateral side: Secondary | ICD-10-CM | POA: Diagnosis not present

## 2015-06-17 DIAGNOSIS — H903 Sensorineural hearing loss, bilateral: Secondary | ICD-10-CM | POA: Diagnosis not present

## 2015-06-21 DIAGNOSIS — M8580 Other specified disorders of bone density and structure, unspecified site: Secondary | ICD-10-CM | POA: Diagnosis not present

## 2015-07-09 DIAGNOSIS — R509 Fever, unspecified: Secondary | ICD-10-CM | POA: Diagnosis not present

## 2015-07-16 DIAGNOSIS — H903 Sensorineural hearing loss, bilateral: Secondary | ICD-10-CM | POA: Diagnosis not present

## 2015-07-16 DIAGNOSIS — H9313 Tinnitus, bilateral: Secondary | ICD-10-CM | POA: Diagnosis not present

## 2015-08-19 DIAGNOSIS — R509 Fever, unspecified: Secondary | ICD-10-CM | POA: Diagnosis not present

## 2015-08-19 DIAGNOSIS — R51 Headache: Secondary | ICD-10-CM | POA: Diagnosis not present

## 2015-08-19 DIAGNOSIS — R109 Unspecified abdominal pain: Secondary | ICD-10-CM | POA: Diagnosis not present

## 2015-08-19 DIAGNOSIS — R300 Dysuria: Secondary | ICD-10-CM | POA: Diagnosis not present

## 2015-08-24 DIAGNOSIS — H903 Sensorineural hearing loss, bilateral: Secondary | ICD-10-CM | POA: Diagnosis not present

## 2015-08-30 DIAGNOSIS — M1712 Unilateral primary osteoarthritis, left knee: Secondary | ICD-10-CM | POA: Diagnosis not present

## 2015-11-08 DIAGNOSIS — C9110 Chronic lymphocytic leukemia of B-cell type not having achieved remission: Secondary | ICD-10-CM | POA: Diagnosis not present

## 2015-11-08 DIAGNOSIS — N183 Chronic kidney disease, stage 3 (moderate): Secondary | ICD-10-CM | POA: Diagnosis not present

## 2015-11-08 DIAGNOSIS — Z79899 Other long term (current) drug therapy: Secondary | ICD-10-CM | POA: Diagnosis not present

## 2015-11-30 DIAGNOSIS — M5137 Other intervertebral disc degeneration, lumbosacral region: Secondary | ICD-10-CM | POA: Diagnosis not present

## 2015-12-10 DIAGNOSIS — M1712 Unilateral primary osteoarthritis, left knee: Secondary | ICD-10-CM | POA: Diagnosis not present

## 2015-12-15 ENCOUNTER — Other Ambulatory Visit: Payer: Self-pay | Admitting: Geriatric Medicine

## 2015-12-15 DIAGNOSIS — N183 Chronic kidney disease, stage 3 (moderate): Secondary | ICD-10-CM | POA: Diagnosis not present

## 2015-12-15 DIAGNOSIS — R1032 Left lower quadrant pain: Secondary | ICD-10-CM

## 2015-12-15 DIAGNOSIS — C9110 Chronic lymphocytic leukemia of B-cell type not having achieved remission: Secondary | ICD-10-CM | POA: Diagnosis not present

## 2015-12-15 DIAGNOSIS — R61 Generalized hyperhidrosis: Secondary | ICD-10-CM | POA: Diagnosis not present

## 2015-12-16 ENCOUNTER — Ambulatory Visit
Admission: RE | Admit: 2015-12-16 | Discharge: 2015-12-16 | Disposition: A | Discharge status: Home / Self Care | Payer: Medicare Other | Source: Ambulatory Visit | Attending: Geriatric Medicine | Admitting: Geriatric Medicine

## 2015-12-16 DIAGNOSIS — R1032 Left lower quadrant pain: Secondary | ICD-10-CM

## 2015-12-16 DIAGNOSIS — K5730 Diverticulosis of large intestine without perforation or abscess without bleeding: Secondary | ICD-10-CM | POA: Diagnosis not present

## 2015-12-16 MED ORDER — IOPAMIDOL (ISOVUE-300) INJECTION 61%
100.0000 mL | Freq: Once | INTRAVENOUS | Status: AC | PRN
  Administered 2015-12-16: 100 mL via INTRAVENOUS

## 2015-12-29 DIAGNOSIS — H5201 Hypermetropia, right eye: Secondary | ICD-10-CM | POA: Diagnosis not present

## 2015-12-29 DIAGNOSIS — Z961 Presence of intraocular lens: Secondary | ICD-10-CM | POA: Diagnosis not present

## 2015-12-29 DIAGNOSIS — H2511 Age-related nuclear cataract, right eye: Secondary | ICD-10-CM | POA: Diagnosis not present

## 2016-05-08 DIAGNOSIS — K219 Gastro-esophageal reflux disease without esophagitis: Secondary | ICD-10-CM | POA: Diagnosis not present

## 2016-05-08 DIAGNOSIS — Z1389 Encounter for screening for other disorder: Secondary | ICD-10-CM | POA: Diagnosis not present

## 2016-05-08 DIAGNOSIS — C9110 Chronic lymphocytic leukemia of B-cell type not having achieved remission: Secondary | ICD-10-CM | POA: Diagnosis not present

## 2016-05-08 DIAGNOSIS — E7800 Pure hypercholesterolemia, unspecified: Secondary | ICD-10-CM | POA: Diagnosis not present

## 2016-05-08 DIAGNOSIS — N183 Chronic kidney disease, stage 3 (moderate): Secondary | ICD-10-CM | POA: Diagnosis not present

## 2016-05-08 DIAGNOSIS — Z0000 Encounter for general adult medical examination without abnormal findings: Secondary | ICD-10-CM | POA: Diagnosis not present

## 2016-05-08 DIAGNOSIS — Z79899 Other long term (current) drug therapy: Secondary | ICD-10-CM | POA: Diagnosis not present

## 2016-05-24 DIAGNOSIS — M1712 Unilateral primary osteoarthritis, left knee: Secondary | ICD-10-CM | POA: Diagnosis not present

## 2016-05-30 DIAGNOSIS — M5137 Other intervertebral disc degeneration, lumbosacral region: Secondary | ICD-10-CM | POA: Diagnosis not present

## 2016-06-13 DIAGNOSIS — M545 Low back pain: Secondary | ICD-10-CM | POA: Diagnosis not present

## 2016-06-13 DIAGNOSIS — M62562 Muscle wasting and atrophy, not elsewhere classified, left lower leg: Secondary | ICD-10-CM | POA: Diagnosis not present

## 2016-06-13 DIAGNOSIS — M5137 Other intervertebral disc degeneration, lumbosacral region: Secondary | ICD-10-CM | POA: Diagnosis not present

## 2016-06-13 DIAGNOSIS — M179 Osteoarthritis of knee, unspecified: Secondary | ICD-10-CM | POA: Diagnosis not present

## 2016-06-16 DIAGNOSIS — M5137 Other intervertebral disc degeneration, lumbosacral region: Secondary | ICD-10-CM | POA: Diagnosis not present

## 2016-06-16 DIAGNOSIS — M62562 Muscle wasting and atrophy, not elsewhere classified, left lower leg: Secondary | ICD-10-CM | POA: Diagnosis not present

## 2016-06-16 DIAGNOSIS — M545 Low back pain: Secondary | ICD-10-CM | POA: Diagnosis not present

## 2016-06-16 DIAGNOSIS — M179 Osteoarthritis of knee, unspecified: Secondary | ICD-10-CM | POA: Diagnosis not present

## 2016-06-16 DIAGNOSIS — Z23 Encounter for immunization: Secondary | ICD-10-CM | POA: Diagnosis not present

## 2016-06-29 DIAGNOSIS — M545 Low back pain: Secondary | ICD-10-CM | POA: Diagnosis not present

## 2016-06-29 DIAGNOSIS — M179 Osteoarthritis of knee, unspecified: Secondary | ICD-10-CM | POA: Diagnosis not present

## 2016-06-29 DIAGNOSIS — M62562 Muscle wasting and atrophy, not elsewhere classified, left lower leg: Secondary | ICD-10-CM | POA: Diagnosis not present

## 2016-06-29 DIAGNOSIS — M5137 Other intervertebral disc degeneration, lumbosacral region: Secondary | ICD-10-CM | POA: Diagnosis not present

## 2016-07-12 DIAGNOSIS — M62562 Muscle wasting and atrophy, not elsewhere classified, left lower leg: Secondary | ICD-10-CM | POA: Diagnosis not present

## 2016-07-12 DIAGNOSIS — M545 Low back pain: Secondary | ICD-10-CM | POA: Diagnosis not present

## 2016-07-12 DIAGNOSIS — M179 Osteoarthritis of knee, unspecified: Secondary | ICD-10-CM | POA: Diagnosis not present

## 2016-07-12 DIAGNOSIS — M5137 Other intervertebral disc degeneration, lumbosacral region: Secondary | ICD-10-CM | POA: Diagnosis not present

## 2016-07-14 DIAGNOSIS — M545 Low back pain: Secondary | ICD-10-CM | POA: Diagnosis not present

## 2016-07-14 DIAGNOSIS — M179 Osteoarthritis of knee, unspecified: Secondary | ICD-10-CM | POA: Diagnosis not present

## 2016-07-14 DIAGNOSIS — M62562 Muscle wasting and atrophy, not elsewhere classified, left lower leg: Secondary | ICD-10-CM | POA: Diagnosis not present

## 2016-07-14 DIAGNOSIS — M5137 Other intervertebral disc degeneration, lumbosacral region: Secondary | ICD-10-CM | POA: Diagnosis not present

## 2016-07-26 ENCOUNTER — Other Ambulatory Visit: Payer: Self-pay | Admitting: Internal Medicine

## 2016-07-26 ENCOUNTER — Ambulatory Visit
Admission: RE | Admit: 2016-07-26 | Discharge: 2016-07-26 | Disposition: A | Discharge status: Home / Self Care | Payer: Medicare Other | Source: Ambulatory Visit | Attending: Internal Medicine | Admitting: Internal Medicine

## 2016-07-26 DIAGNOSIS — J209 Acute bronchitis, unspecified: Secondary | ICD-10-CM | POA: Diagnosis not present

## 2016-07-26 DIAGNOSIS — R634 Abnormal weight loss: Secondary | ICD-10-CM | POA: Diagnosis not present

## 2016-07-26 DIAGNOSIS — R0602 Shortness of breath: Secondary | ICD-10-CM | POA: Diagnosis not present

## 2016-07-26 DIAGNOSIS — J40 Bronchitis, not specified as acute or chronic: Secondary | ICD-10-CM

## 2016-07-26 DIAGNOSIS — R1030 Lower abdominal pain, unspecified: Secondary | ICD-10-CM | POA: Diagnosis not present

## 2016-07-26 DIAGNOSIS — C9110 Chronic lymphocytic leukemia of B-cell type not having achieved remission: Secondary | ICD-10-CM | POA: Diagnosis not present

## 2016-07-26 DIAGNOSIS — R05 Cough: Secondary | ICD-10-CM | POA: Diagnosis not present

## 2016-07-27 ENCOUNTER — Other Ambulatory Visit: Payer: Self-pay | Admitting: Geriatric Medicine

## 2016-07-27 ENCOUNTER — Other Ambulatory Visit: Payer: Self-pay | Admitting: Internal Medicine

## 2016-07-27 DIAGNOSIS — R1030 Lower abdominal pain, unspecified: Secondary | ICD-10-CM

## 2016-07-27 DIAGNOSIS — R634 Abnormal weight loss: Secondary | ICD-10-CM

## 2016-08-01 DIAGNOSIS — J209 Acute bronchitis, unspecified: Secondary | ICD-10-CM | POA: Diagnosis not present

## 2016-08-02 ENCOUNTER — Ambulatory Visit
Admission: RE | Admit: 2016-08-02 | Discharge: 2016-08-02 | Disposition: A | Discharge status: Home / Self Care | Payer: Medicare Other | Source: Ambulatory Visit | Attending: Internal Medicine | Admitting: Internal Medicine

## 2016-08-02 DIAGNOSIS — R1030 Lower abdominal pain, unspecified: Secondary | ICD-10-CM

## 2016-08-02 DIAGNOSIS — R634 Abnormal weight loss: Secondary | ICD-10-CM

## 2016-08-02 DIAGNOSIS — R109 Unspecified abdominal pain: Secondary | ICD-10-CM | POA: Diagnosis not present

## 2016-08-17 ENCOUNTER — Telehealth: Payer: Self-pay | Admitting: Hematology

## 2016-08-17 ENCOUNTER — Ambulatory Visit (HOSPITAL_BASED_OUTPATIENT_CLINIC_OR_DEPARTMENT_OTHER): Payer: Medicare Other | Admitting: Hematology

## 2016-08-17 ENCOUNTER — Encounter: Payer: Self-pay | Admitting: Hematology

## 2016-08-17 VITALS — BP 124/72 | HR 85 | Temp 98.4°F | Resp 18 | Ht 65.0 in | Wt 160.9 lb

## 2016-08-17 DIAGNOSIS — C9110 Chronic lymphocytic leukemia of B-cell type not having achieved remission: Secondary | ICD-10-CM

## 2016-08-17 NOTE — Telephone Encounter (Signed)
Lab scheduled for tomorrow 08/18/16.  Appointments scheduled per 08/17/16 los. Patient was given a copy of the appointment schedule and AVS report per 08/17/16 los.

## 2016-08-18 ENCOUNTER — Ambulatory Visit (HOSPITAL_COMMUNITY)
Admission: RE | Admit: 2016-08-18 | Discharge: 2016-08-18 | Disposition: A | Discharge status: Home / Self Care | Payer: Medicare Other | Source: Ambulatory Visit | Attending: Hematology | Admitting: Hematology

## 2016-08-18 ENCOUNTER — Other Ambulatory Visit (HOSPITAL_BASED_OUTPATIENT_CLINIC_OR_DEPARTMENT_OTHER): Payer: Medicare Other

## 2016-08-18 ENCOUNTER — Other Ambulatory Visit (HOSPITAL_COMMUNITY)
Admission: RE | Admit: 2016-08-18 | Discharge: 2016-08-18 | Disposition: A | Discharge status: Home / Self Care | Payer: Medicare Other | Source: Ambulatory Visit | Attending: Hematology | Admitting: Hematology

## 2016-08-18 ENCOUNTER — Other Ambulatory Visit: Payer: Self-pay | Admitting: *Deleted

## 2016-08-18 DIAGNOSIS — D7289 Other specified disorders of white blood cells: Secondary | ICD-10-CM | POA: Diagnosis not present

## 2016-08-18 DIAGNOSIS — C9110 Chronic lymphocytic leukemia of B-cell type not having achieved remission: Secondary | ICD-10-CM

## 2016-08-18 DIAGNOSIS — R05 Cough: Secondary | ICD-10-CM | POA: Diagnosis not present

## 2016-08-18 LAB — COMPREHENSIVE METABOLIC PANEL
ALBUMIN: 3.8 g/dL (ref 3.5–5.0)
ALT: 10 U/L (ref 0–55)
AST: 16 U/L (ref 5–34)
Alkaline Phosphatase: 78 U/L (ref 40–150)
Anion Gap: 8 mEq/L (ref 3–11)
BUN: 20.2 mg/dL (ref 7.0–26.0)
CHLORIDE: 105 meq/L (ref 98–109)
CO2: 27 meq/L (ref 22–29)
Calcium: 10.1 mg/dL (ref 8.4–10.4)
Creatinine: 1 mg/dL (ref 0.6–1.1)
EGFR: 50 mL/min/{1.73_m2} — ABNORMAL LOW (ref >90)
GLUCOSE: 94 mg/dL (ref 70–140)
POTASSIUM: 4.1 meq/L (ref 3.5–5.1)
SODIUM: 140 meq/L (ref 136–145)
Total Bilirubin: 0.44 mg/dL (ref 0.20–1.20)
Total Protein: 6.8 g/dL (ref 6.4–8.3)

## 2016-08-18 LAB — CBC & DIFF AND RETIC
BASO%: 0.2 % (ref 0.0–2.0)
Basophils Absolute: 0 10*3/uL (ref 0.0–0.1)
EOS ABS: 0.2 10*3/uL (ref 0.0–0.5)
EOS%: 1.2 % (ref 0.0–7.0)
HCT: 36.7 % (ref 34.8–46.6)
HEMOGLOBIN: 12.3 g/dL (ref 11.6–15.9)
Immature Retic Fract: 4.7 % (ref 1.60–10.00)
LYMPH%: 56.6 % — ABNORMAL HIGH (ref 14.0–49.7)
MCH: 30.9 pg (ref 25.1–34.0)
MCHC: 33.5 g/dL (ref 31.5–36.0)
MCV: 92.2 fL (ref 79.5–101.0)
MONO#: 1.3 10*3/uL — ABNORMAL HIGH (ref 0.1–0.9)
MONO%: 8.5 % (ref 0.0–14.0)
NEUT%: 33.5 % — ABNORMAL LOW (ref 38.4–76.8)
NEUTROS ABS: 5.2 10*3/uL (ref 1.5–6.5)
Platelets: 294 10*3/uL (ref 145–400)
RBC: 3.98 10*6/uL (ref 3.70–5.45)
RDW: 15 % — AB (ref 11.2–14.5)
RETIC %: 1.52 % (ref 0.70–2.10)
Retic Ct Abs: 60.5 10*3/uL (ref 33.70–90.70)
WBC: 15.5 10*3/uL — AB (ref 3.9–10.3)
lymph#: 8.8 10*3/uL — ABNORMAL HIGH (ref 0.9–3.3)

## 2016-08-18 LAB — LACTATE DEHYDROGENASE: LDH: 156 U/L (ref 125–245)

## 2016-08-18 LAB — TECHNOLOGIST REVIEW

## 2016-08-19 LAB — PROCALCITONIN: Procalcitonin: <0.02 ng/mL (ref 0.00–0.08)

## 2016-08-22 LAB — MULTIPLE MYELOMA PANEL, SERUM
ALBUMIN SERPL ELPH-MCNC: 3.7 g/dL (ref 2.9–4.4)
ALBUMIN/GLOB SERPL: 1.5 (ref 0.7–1.7)
ALPHA 1: 0.2 g/dL (ref 0.0–0.4)
ALPHA2 GLOB SERPL ELPH-MCNC: 0.8 g/dL (ref 0.4–1.0)
B-GLOBULIN SERPL ELPH-MCNC: 0.8 g/dL (ref 0.7–1.3)
GAMMA GLOB SERPL ELPH-MCNC: 0.7 g/dL (ref 0.4–1.8)
GLOBULIN, TOTAL: 2.5 g/dL (ref 2.2–3.9)
IGA/IMMUNOGLOBULIN A, SERUM: 83 mg/dL (ref 64–422)
IGG (IMMUNOGLOBIN G), SERUM: 827 mg/dL (ref 700–1600)
IgM, Qn, Serum: 48 mg/dL (ref 26–217)
Total Protein: 6.2 g/dL (ref 6.0–8.5)

## 2016-08-23 LAB — FLOW CYTOMETRY

## 2016-09-04 LAB — FISH,CLL PROGNOSTIC PANEL

## 2016-10-06 DIAGNOSIS — M1712 Unilateral primary osteoarthritis, left knee: Secondary | ICD-10-CM | POA: Diagnosis not present

## 2016-10-08 NOTE — Progress Notes (Signed)
Marland Kitchen    HEMATOLOGY/ONCOLOGY CONSULTATION NOTE  Date of Service: 10/08/2016  Patient Care Team: [name], MD as PCP - General (Internal Medicine)  CHIEF COMPLAINTS/PURPOSE OF CONSULTATION:  Lymphocytosis/CLL  HISTORY OF PRESENTING ILLNESS:   Rebecca King is a wonderful 83 y.o. female who has been referred to [place] by Dr .Mathews Argyle, MD  for evaluation and management of lymphocytosis.  Patient has a history of osteopenia, dyslipidemia, GERD and As per PCP notes a known diagnosis of CLL diagnosed in March 2011. Patient is not very aware of this diagnosis when asked today.  She notes that she recently has had a cough and a cold from early November 2017 which she has had difficulties getting over. Notes that she had some fevers and chills at the time and was treated with azithromycin for 5 days. She also notes that she go to steroid shot.  She notes no objective fevers at this time. She is currently afebrile with a temperature of 98.27F and saturating 97% on room air. She has a history of GERD and notes some upper abdominal discomfort. She had an ultrasound of the abdomen done by her primary care physician on 08/02/2016 which showed no acute intra-abdominal pathology. No splenomegaly..   She reports some increased fatigue and weight loss from 172 down to 160 pounds. She also notes some decreased appetite. She notes that her daughter had ALL and wonders what her blood counts mean.  She notes no overt palpable new lymph nodes. Currently no acute shortness of breath or chest pain.  Labs done in our clinic today showed no anemia with a normal hemoglobin of 12.3, mild leukocytosis of 15.5k with 8.8k lymphocytes. Normal platelets.  She had a chest x-ray which showed no acute infiltrates but overinflation suggestive of COPD. procalcitonin was in normal limits and did not suggest an ongoing bacterial process. Labs also did not show any hypogammaglobinemia.  MEDICAL HISTORY:  Past  Medical History:  Diagnosis Date  . Colon polyp   . DJD (degenerative joint disease)   . GERD (gastroesophageal reflux disease)   . Hyperlipidemia   . Neuropathy ([place])   . Osteopenia   . Renal cyst   DJD knees Diverticulosis Hearing loss Poor vision CLL March 2011 Microscopic hematuria.  SURGICAL HISTORY: Past Surgical History:  Procedure Laterality Date  . LEFT HEART CATHETERIZATION WITH CORONARY ANGIOGRAM N/A 12/25/2011   Procedure: LEFT HEART CATHETERIZATION WITH CORONARY ANGIOGRAM;  Surgeon: Sinclair Grooms, MD;  [facility] C[facility] LAB;  Service: Cardiovascular;  Laterality: N/A;    SOCIAL HISTORY: Social History   Social History  . Marital status: Married    Spouse name: N/A  . Number of children: N/A  . Years of education: N/A   Occupational History  . Not on file.   Social History Main Topics  . Smoking status: Former Research scientist (life sciences)  . Smokeless tobacco: Never Used  . Alcohol use No  . Drug use: No  . Sexual activity: Not on file   Other Topics Concern  . Not on file   Social History Narrative  . No narrative on file    FAMILY HISTORY: Family History  Problem Relation Age of Onset  . Family history unknown: Yes    ALLERGIES:  is allergic to ciprofloxacin and sulfa antibiotics.  MEDICATIONS:  Current Outpatient Prescriptions  Medication Sig Dispense Refill  . amoxicillin-clavulanate (AUGMENTIN) 875-125 MG per tablet Take 1 tablet by mouth every 12 (twelve) hours. 14 tablet 0  . Ascorbic Acid (VITAMIN  C PO) Take 1 tablet by mouth daily.    Marland Kitchen aspirin 81 MG tablet Take 81 mg by mouth daily.    Marland Kitchen BIOTIN PO Take 1 tablet by mouth daily.    . Coenzyme Q10 (CO Q 10 PO) Take 1 tablet by mouth daily.    . Ergocalciferol (CALCIFEROL PO) Take 1 tablet by mouth daily.    . Glucosamine-Chondroitin (GLUCOSAMINE CHONDR COMPLEX PO) Take 1 tablet by mouth daily.    Marland Kitchen KRILL OIL PO Take 1 capsule by mouth daily.    . Multiple Vitamin (MULITIVITAMIN WITH MINERALS) TABS  Take 1 tablet by mouth daily.    Marland Kitchen NITROSTAT 0.4 MG SL tablet DISSOLVE ONE TABLET UNDERTONGUE AS DIRECTED AS    NEEDED FOR CHEST PAIN 25 tablet 0  . omeprazole (PRILOSEC) 20 MG capsule Take 20 mg by mouth daily.    . rosuvastatin (CRESTOR) 5 MG tablet Take 5 mg by mouth daily.    . traMADol (ULTRAM) 50 MG tablet Take 100 mg by mouth every 6 (six) hours as needed for pain.     No current facility-administered medications for this visit.     REVIEW OF SYSTEMS:    10 Point review of Systems was done is negative except as noted above.  PHYSICAL EXAMINATION: ECOG PERFORMANCE STATUS: 2 - Symptomatic, <50% confined to bed  . Vitals:   08/17/16 1523  BP: 124/72  Pulse: 85  Resp: 18  Temp: 98.4 F (36.9 C)   Filed Weights   08/17/16 1523  Weight: 160 lb 14.4 oz (73 kg)   .Body mass index is 26.78 kg/m.  GENERAL:alert, in no acute distress and comfortable SKIN: skin color, texture, turgor are normal, no rashes or significant lesions EYES: normal, conjunctiva are pink and non-injected, sclera clear OROPHARYNX:no exudate, no erythema and lips, buccal mucosa, and tongue normal  NECK: supple, no JVD, thyroid normal size, non-tender, without nodularity LYMPH:  no palpable lymphadenopathy in the axillary or inguinal areas. Small subcentimeter cervical LN's b/l posterior triangle LUNGS: decreased air entry b/l. No rales no rhonci HEART: regular rate & rhythm,  no murmurs and no lower extremity edema ABDOMEN: abdomen soft, non-tender, normoactive bowel sounds  Musculoskeletal: no cyanosis of digits and no clubbing  PSYCH: alert & oriented x 3 with fluent speech NEURO: no focal motor/sensory deficits  LABORATORY DATA:  I have reviewed the data as listed  . CBC Latest Ref Rng & Units 08/18/2016 11/01/2012 04/14/2010  WBC 3.9 - 10.3 10e3/uL 15.5(H) 17.6(H) 13.9(H)  Hemoglobin 11.6 - 15.9 g/dL 12.3 13.3 9.2(L)  Hematocrit 34.8 - 46.6 % 36.7 38.0 26.7(L)  Platelets 145 - 400 10e3/uL 294 299  239   . CBC    Component Value Date/Time   WBC 15.5 (H) 08/18/2016 1426   WBC 17.6 (H) 11/01/2012 0244   RBC 3.98 08/18/2016 1426   RBC 4.20 11/01/2012 0244   HGB 12.3 08/18/2016 1426   HCT 36.7 08/18/2016 1426   PLT 294 08/18/2016 1426   MCV 92.2 08/18/2016 1426   MCH 30.9 08/18/2016 1426   MCH 31.7 11/01/2012 0244   MCHC 33.5 08/18/2016 1426   MCHC 35.0 11/01/2012 0244   RDW 15.0 (H) 08/18/2016 1426   LYMPHSABS 8.8 (H) 08/18/2016 1426   MONOABS 1.3 (H) 08/18/2016 1426   EOSABS 0.2 08/18/2016 1426   BASOSABS 0.0 08/18/2016 1426     . CMP Latest Ref Rng & Units 08/18/2016 08/18/2016 11/01/2012  Glucose 70 - 140 mg/dl 94 - 118(H)  BUN  7.0 - 26.0 mg/dL 20.2 - 18  Creatinine 0.6 - 1.1 mg/dL 1.0 - 1.09  Sodium 136 - 145 mEq/L 140 - 129(L)  Potassium 3.5 - 5.1 mEq/L 4.1 - 4.1  Chloride 96 - 112 mEq/L - - 92(L)  CO2 22 - 29 mEq/L 27 - 25  Calcium 8.4 - 10.4 mg/dL 10.1 - 9.6  Total Protein 6.0 - 8.5 g/dL 6.2 6.8 6.7  Total Bilirubin 0.20 - 1.20 mg/dL 0.44 - 0.6  Alkaline Phos 40 - 150 U/L 78 - 124(H)  AST 5 - 34 U/L 16 - 48(H)  ALT 0 - 55 U/L 10 - 96(H)   . Lab Results  Component Value Date   LDH 156 08/18/2016             RADIOGRAPHIC STUDIES: I have personally reviewed the radiological images as listed and agreed with the findings in the report. No results found.  ASSESSMENT & PLAN:   83 yo female with   1) Rai Stage 1 chronic lymphocytic leukemia. Apparently was previously diagnosed in March 2011 but no data available from this. CLL Fish prognostic panel showed predominantly 13q deletion in 63% of the cells. This typically suggests a good prognosis small clone with trisomy 33 which is an intermediate prognosis.    normal LDH levels. No palpable splenomegaly. No significant cytopenias at this time. Minimal cervical lymphadenopathy. Patient has some weight loss and some abdominal pain which could be related to her GERD. Ultrasound did not show any overt  abdominal pathology. Could be related to her COPD exacerbation/bronchitis due to viral process that could have affected her appetite and cause weight loss as well.  Myeloma panel negative. No evidence of hypogammaglobinemia related to CLL. Plan  -No acute indication for treatment of patient's CLL at this time . -If patient continues to have weight loss or other constitutional symptoms not related to any other pathology might need to consider treatment of her CLL . -No hypogammaglobinemia at this point to suggest need for IVIG with her somewhat persistent respiratory symptoms . -Chest x-ray shows hyperinflation suggestive of emphysema but no acute infiltrates . -Would recommend she continue follow-up with primary care physician for workup of her abdominal pain possibly with a CT of the abdomen and pelvis if needed and possibly EGD . -We discussed the diagnosis of CLL prognosis and typical course and treatment indications in great details and all her questions were answered .  We will plan to see her back in 3 months with repeat labs unless any new concerns or symptoms involving the interim .  RTC with Dr Irene Limbo in 3 months with labs  All of the patients questions were answered with apparent satisfaction. The patient knows to call the clinic with any problems, questions or concerns.  I spent 45 minutes counseling the patient face to face. The total time spent in the appointment was 60 minutes and more than 50% was on counseling and direct patient cares.    Sullivan Lone MD [place] AAHIVMS [facility] Hematology/Oncology Physician [facility]  (Office):       [phone] (Work cell):  [phone] (Fax):           [phone]  10/08/2016 12:56 PM

## 2016-10-18 DIAGNOSIS — M5137 Other intervertebral disc degeneration, lumbosacral region: Secondary | ICD-10-CM | POA: Diagnosis not present

## 2016-11-07 DIAGNOSIS — G8929 Other chronic pain: Secondary | ICD-10-CM | POA: Diagnosis not present

## 2016-11-07 DIAGNOSIS — C9110 Chronic lymphocytic leukemia of B-cell type not having achieved remission: Secondary | ICD-10-CM | POA: Diagnosis not present

## 2016-11-07 DIAGNOSIS — M5442 Lumbago with sciatica, left side: Secondary | ICD-10-CM | POA: Diagnosis not present

## 2016-11-07 DIAGNOSIS — M79605 Pain in left leg: Secondary | ICD-10-CM | POA: Diagnosis not present

## 2016-11-07 DIAGNOSIS — M79604 Pain in right leg: Secondary | ICD-10-CM | POA: Diagnosis not present

## 2016-11-16 ENCOUNTER — Telehealth: Payer: Self-pay | Admitting: Hematology

## 2016-11-16 NOTE — Telephone Encounter (Signed)
Spoke with patient confirming new appointment for 4/12

## 2016-11-17 ENCOUNTER — Other Ambulatory Visit: Payer: Self-pay

## 2016-11-17 ENCOUNTER — Ambulatory Visit: Payer: Self-pay | Admitting: Hematology

## 2016-11-23 ENCOUNTER — Telehealth: Payer: Self-pay | Admitting: Hematology

## 2016-11-23 ENCOUNTER — Other Ambulatory Visit: Payer: Self-pay | Admitting: *Deleted

## 2016-11-23 ENCOUNTER — Other Ambulatory Visit (HOSPITAL_BASED_OUTPATIENT_CLINIC_OR_DEPARTMENT_OTHER): Payer: Medicare Other

## 2016-11-23 ENCOUNTER — Encounter: Payer: Self-pay | Admitting: Hematology

## 2016-11-23 ENCOUNTER — Ambulatory Visit (HOSPITAL_BASED_OUTPATIENT_CLINIC_OR_DEPARTMENT_OTHER): Payer: Medicare Other | Admitting: Hematology

## 2016-11-23 VITALS — BP 131/69 | HR 73 | Temp 98.0°F | Resp 18 | Ht 65.0 in | Wt 159.7 lb

## 2016-11-23 DIAGNOSIS — C9110 Chronic lymphocytic leukemia of B-cell type not having achieved remission: Secondary | ICD-10-CM

## 2016-11-23 LAB — COMPREHENSIVE METABOLIC PANEL
ALBUMIN: 3.6 g/dL (ref 3.5–5.0)
ALK PHOS: 58 U/L (ref 40–150)
ALT: 13 U/L (ref 0–55)
ANION GAP: 7 meq/L (ref 3–11)
AST: 19 U/L (ref 5–34)
BILIRUBIN TOTAL: 0.43 mg/dL (ref 0.20–1.20)
BUN: 16.2 mg/dL (ref 7.0–26.0)
CALCIUM: 9.9 mg/dL (ref 8.4–10.4)
CO2: 27 mEq/L (ref 22–29)
Chloride: 107 mEq/L (ref 98–109)
Creatinine: 1 mg/dL (ref 0.6–1.1)
EGFR: 48 mL/min/{1.73_m2} — ABNORMAL LOW (ref >90)
Glucose: 92 mg/dl (ref 70–140)
Potassium: 4.4 mEq/L (ref 3.5–5.1)
Sodium: 140 mEq/L (ref 136–145)
TOTAL PROTEIN: 6.4 g/dL (ref 6.4–8.3)

## 2016-11-23 LAB — CBC & DIFF AND RETIC
BASO%: 0.2 % (ref 0.0–2.0)
Basophils Absolute: 0 10*3/uL (ref 0.0–0.1)
EOS ABS: 0.1 10*3/uL (ref 0.0–0.5)
EOS%: 0.6 % (ref 0.0–7.0)
HCT: 34.8 % (ref 34.8–46.6)
HEMOGLOBIN: 11.7 g/dL (ref 11.6–15.9)
IMMATURE RETIC FRACT: 5.1 % (ref 1.60–10.00)
LYMPH%: 59.9 % — ABNORMAL HIGH (ref 14.0–49.7)
MCH: 30.9 pg (ref 25.1–34.0)
MCHC: 33.6 g/dL (ref 31.5–36.0)
MCV: 91.8 fL (ref 79.5–101.0)
MONO#: 1 10*3/uL — AB (ref 0.1–0.9)
MONO%: 8.2 % (ref 0.0–14.0)
NEUT#: 3.9 10*3/uL (ref 1.5–6.5)
NEUT%: 31.1 % — AB (ref 38.4–76.8)
PLATELETS: 274 10*3/uL (ref 145–400)
RBC: 3.79 10*6/uL (ref 3.70–5.45)
RDW: 15.5 % — ABNORMAL HIGH (ref 11.2–14.5)
Retic %: 1.21 % (ref 0.70–2.10)
Retic Ct Abs: 45.86 10*3/uL (ref 33.70–90.70)
WBC: 12.5 10*3/uL — AB (ref 3.9–10.3)
lymph#: 7.5 10*3/uL — ABNORMAL HIGH (ref 0.9–3.3)

## 2016-11-23 LAB — TECHNOLOGIST REVIEW

## 2016-11-23 NOTE — Telephone Encounter (Signed)
Gave patient AVS and calender per 4/12 los.  

## 2016-11-24 NOTE — Progress Notes (Signed)
HEMATOLOGY/ONCOLOGY CLINIC NOTE  Date of Service: 11/24/2016  Patient Care Team: [name], MD as PCP - General (Internal Medicine)  CHIEF COMPLAINTS/PURPOSE OF CONSULTATION:  f/u for CLL  HISTORY OF PRESENTING ILLNESS:   Rebecca King is a wonderful 83 y.o. female who has been referred to [place] by Dr .Mathews Argyle, MD  for evaluation and management of lymphocytosis.  Patient has a history of osteopenia, dyslipidemia, GERD and As per PCP notes a known diagnosis of CLL diagnosed in March 2011. Patient is not very aware of this diagnosis when asked today.  She notes that she recently has had a cough and a cold from early November 2017 which she has had difficulties getting over. Notes that she had some fevers and chills at the time and was treated with azithromycin for 5 days. She also notes that she go to steroid shot.  She notes no objective fevers at this time. She is currently afebrile with a temperature of 98.88F and saturating 97% on room air. She has a history of GERD and notes some upper abdominal discomfort. She had an ultrasound of the abdomen done by her primary care physician on 08/02/2016 which showed no acute intra-abdominal pathology. No splenomegaly..   She reports some increased fatigue and weight loss from 172 down to 160 pounds. She also notes some decreased appetite. She notes that her daughter had ALL and wonders what her blood counts mean.  She notes no overt palpable new lymph nodes. Currently no acute shortness of breath or chest pain.  Labs done in our clinic today showed no anemia with a normal hemoglobin of 12.3, mild leukocytosis of 15.5k with 8.8k lymphocytes. Normal platelets.  She had a chest x-ray which showed no acute infiltrates but overinflation suggestive of COPD. procalcitonin was in normal limits and did not suggest an ongoing bacterial process. Labs also did not show any hypogammaglobinemia.  INTERVAL HISTORY  Rebecca King is here  with her husband for follow-up of her CLL. She notes no acute new concerns. Her previous abdominal discomfort and reflux has resolved with proton pump inhibitors. She is eating better and gaining back some of her lost weight. No issues with infections. No issues with bleeding. No new fatigue.   MEDICAL HISTORY:  Past Medical History:  Diagnosis Date  . Colon polyp   . DJD (degenerative joint disease)   . GERD (gastroesophageal reflux disease)   . Hyperlipidemia   . Neuropathy ([place])   . Osteopenia   . Renal cyst   DJD knees Diverticulosis Hearing loss Poor vision CLL March 2011 Microscopic hematuria.  SURGICAL HISTORY: Past Surgical History:  Procedure Laterality Date  . LEFT HEART CATHETERIZATION WITH CORONARY ANGIOGRAM N/A 12/25/2011   Procedure: LEFT HEART CATHETERIZATION WITH CORONARY ANGIOGRAM;  Surgeon: Sinclair Grooms, MD;  Location: [facility] CATH LAB;  Service: Cardiovascular;  Laterality: N/A;    SOCIAL HISTORY: Social History   Social History  . Marital status: Married    Spouse name: N/A  . Number of children: N/A  . Years of education: N/A   Occupational History  . Not on file.   Social History Main Topics  . Smoking status: Former Research scientist (life sciences)  . Smokeless tobacco: Never Used  . Alcohol use No  . Drug use: No  . Sexual activity: Not on file   Other Topics Concern  . Not on file   Social History Narrative  . No narrative on file    FAMILY HISTORY: Family History  Problem Relation Age of Onset  . Family history unknown: Yes    ALLERGIES:  is allergic to ciprofloxacin and sulfa antibiotics.  MEDICATIONS:  Current Outpatient Prescriptions  Medication Sig Dispense Refill  . amoxicillin-clavulanate (AUGMENTIN) 875-125 MG per tablet Take 1 tablet by mouth every 12 (twelve) hours. 14 tablet 0  . Ascorbic Acid (VITAMIN C PO) Take 1 tablet by mouth daily.    Marland Kitchen aspirin 81 MG tablet Take 81 mg by mouth daily.    Marland Kitchen BIOTIN PO Take 1 tablet by mouth daily.    .  Coenzyme Q10 (CO Q 10 PO) Take 1 tablet by mouth daily.    . Ergocalciferol (CALCIFEROL PO) Take 1 tablet by mouth daily.    . Glucosamine-Chondroitin (GLUCOSAMINE CHONDR COMPLEX PO) Take 1 tablet by mouth daily.    Marland Kitchen KRILL OIL PO Take 1 capsule by mouth daily.    . Multiple Vitamin (MULITIVITAMIN WITH MINERALS) TABS Take 1 tablet by mouth daily.    Marland Kitchen NITROSTAT 0.4 MG SL tablet DISSOLVE ONE TABLET UNDERTONGUE AS DIRECTED AS    NEEDED FOR CHEST PAIN 25 tablet 0  . omeprazole (PRILOSEC) 20 MG capsule Take 20 mg by mouth daily.    . rosuvastatin (CRESTOR) 5 MG tablet Take 5 mg by mouth daily.    . traMADol (ULTRAM) 50 MG tablet Take 100 mg by mouth every 6 (six) hours as needed for pain.     No current facility-administered medications for this visit.     REVIEW OF SYSTEMS:    10 Point review of Systems was done is negative except as noted above.  PHYSICAL EXAMINATION: ECOG PERFORMANCE STATUS: 2 - Symptomatic, <50% confined to bed  . Vitals:   11/23/16 1255  BP: 131/69  Pulse: 73  Resp: 18  Temp: 98 F (36.7 C)   Filed Weights   11/23/16 1255  Weight: 159 lb 11.2 oz (72.4 kg)   .Body mass index is 26.58 kg/m.  GENERAL:alert, in no acute distress and comfortable SKIN: skin color, texture, turgor are normal, no rashes or significant lesions EYES: normal, conjunctiva are pink and non-injected, sclera clear OROPHARYNX:no exudate, no erythema and lips, buccal mucosa, and tongue normal  NECK: supple, no JVD, thyroid normal size, non-tender, without nodularity LYMPH:  no palpable lymphadenopathy in the axillary or inguinal areas. Small subcentimeter cervical LN's b/l posterior triangle LUNGS: decreased air entry b/l. No rales no rhonci HEART: regular rate & rhythm,  no murmurs and no lower extremity edema ABDOMEN: abdomen soft, non-tender, normoactive bowel sounds  Musculoskeletal: no cyanosis of digits and no clubbing  PSYCH: alert & oriented x 3 with fluent speech NEURO: no  focal motor/sensory deficits  LABORATORY DATA:  I have reviewed the data as listed  . CBC Latest Ref Rng & Units 11/23/2016 08/18/2016 11/01/2012  WBC 3.9 - 10.3 10e3/uL 12.5(H) 15.5(H) 17.6(H)  Hemoglobin 11.6 - 15.9 g/dL 11.7 12.3 13.3  Hematocrit 34.8 - 46.6 % 34.8 36.7 38.0  Platelets 145 - 400 10e3/uL 274 294 299   . CBC    Component Value Date/Time   WBC 12.5 (H) 11/23/2016 1240   WBC 17.6 (H) 11/01/2012 0244   RBC 3.79 11/23/2016 1240   RBC 4.20 11/01/2012 0244   HGB 11.7 11/23/2016 1240   HCT 34.8 11/23/2016 1240   PLT 274 11/23/2016 1240   MCV 91.8 11/23/2016 1240   MCH 30.9 11/23/2016 1240   MCH 31.7 11/01/2012 0244   MCHC 33.6 11/23/2016 1240   MCHC 35.0 11/01/2012  0244   RDW 15.5 (H) 11/23/2016 1240   LYMPHSABS 7.5 (H) 11/23/2016 1240   MONOABS 1.0 (H) 11/23/2016 1240   EOSABS 0.1 11/23/2016 1240   BASOSABS 0.0 11/23/2016 1240     . CMP Latest Ref Rng & Units 11/23/2016 08/18/2016 08/18/2016  Glucose 70 - 140 mg/dl 92 94 -  BUN 7.0 - 26.0 mg/dL 16.2 20.2 -  Creatinine 0.6 - 1.1 mg/dL 1.0 1.0 -  Sodium 136 - 145 mEq/L 140 140 -  Potassium 3.5 - 5.1 mEq/L 4.4 4.1 -  Chloride 96 - 112 mEq/L - - -  CO2 22 - 29 mEq/L 27 27 -  Calcium 8.4 - 10.4 mg/dL 9.9 10.1 -  Total Protein 6.4 - 8.3 g/dL 6.4 6.2 6.8  Total Bilirubin 0.20 - 1.20 mg/dL 0.43 0.44 -  Alkaline Phos 40 - 150 U/L 58 78 -  AST 5 - 34 U/L 19 16 -  ALT 0 - 55 U/L 13 10 -   . Lab Results  Component Value Date   LDH 156 08/18/2016             RADIOGRAPHIC STUDIES: I have personally reviewed the radiological images as listed and agreed with the findings in the report. No results found.  ASSESSMENT & PLAN:   83 yo female with   1) Rai Stage 1 Chronic lymphocytic leukemia. Apparently was previously diagnosed in March 2011 but no data available from this. CLL Fish prognostic panel showed predominantly 13q deletion in 63% of the cells. This typically suggests a good prognosis small clone  with trisomy 61 which is an intermediate prognosis.    normal LDH levels. No palpable splenomegaly. No significant cytopenias at this time. Minimal cervical lymphadenopathy. No significant weight loss at this time.  No evidence of hypogammaglobinemia related to CLL. Plan  -Patient's labs today show no new cytopenias. WBC and lymphocyte counts are stable and not significantly changed. -No acute indication for treatment of patient's CLL at this time . -No additional workup required at this time. -Patient's previous abdominal discomfort and heartburn have resolved with PPI therapy. -She intends to travel in spring and summer and she was encouraged to do this -She needs to follow-up with her primary care physician to ensure yearly influenza vaccination and to make sure she is up-to-date with both her Pneumovax and Prevnar vaccines .  We will plan to see her back in 4 months with repeat labs unless any new concerns or symptoms involving the interim. If her counts remain stable at 4 months will see her every 6 months or if the patient so desires we could alternate visits with Dr. Felipa Eth.  RTC with Dr Irene Limbo in 4 months with labs  All of the patients questions were answered with apparent satisfaction. The patient knows to call the clinic with any problems, questions or concerns.  I spent 20 minutes counseling the patient face to face. The total time spent in the appointment was 25 minutes and more than 50% was on counseling and direct patient cares.    Sullivan Lone MD [place] AAHIVMS [facility] Hematology/Oncology Physician [facility]  (Office):       [phone] (Work cell):  [phone] (Fax):           [phone]  11/24/2016 10:56 AM

## 2016-11-29 DIAGNOSIS — M47816 Spondylosis without myelopathy or radiculopathy, lumbar region: Secondary | ICD-10-CM | POA: Diagnosis not present

## 2016-12-18 DIAGNOSIS — J301 Allergic rhinitis due to pollen: Secondary | ICD-10-CM | POA: Diagnosis not present

## 2016-12-18 DIAGNOSIS — R05 Cough: Secondary | ICD-10-CM | POA: Diagnosis not present

## 2016-12-18 DIAGNOSIS — Z87891 Personal history of nicotine dependence: Secondary | ICD-10-CM | POA: Diagnosis not present

## 2016-12-22 DIAGNOSIS — Z87891 Personal history of nicotine dependence: Secondary | ICD-10-CM | POA: Diagnosis not present

## 2016-12-22 DIAGNOSIS — Z7289 Other problems related to lifestyle: Secondary | ICD-10-CM | POA: Diagnosis not present

## 2016-12-22 DIAGNOSIS — H903 Sensorineural hearing loss, bilateral: Secondary | ICD-10-CM | POA: Diagnosis not present

## 2016-12-22 DIAGNOSIS — J302 Other seasonal allergic rhinitis: Secondary | ICD-10-CM | POA: Diagnosis not present

## 2016-12-22 DIAGNOSIS — Z974 Presence of external hearing-aid: Secondary | ICD-10-CM | POA: Diagnosis not present

## 2016-12-22 DIAGNOSIS — H9313 Tinnitus, bilateral: Secondary | ICD-10-CM | POA: Diagnosis not present

## 2016-12-22 DIAGNOSIS — H6121 Impacted cerumen, right ear: Secondary | ICD-10-CM | POA: Diagnosis not present

## 2017-01-26 ENCOUNTER — Other Ambulatory Visit: Payer: Self-pay | Admitting: Nurse Practitioner

## 2017-01-26 ENCOUNTER — Ambulatory Visit
Admission: RE | Admit: 2017-01-26 | Discharge: 2017-01-26 | Disposition: A | Discharge status: Home / Self Care | Payer: Medicare Other | Source: Ambulatory Visit | Attending: Nurse Practitioner | Admitting: Nurse Practitioner

## 2017-01-26 DIAGNOSIS — R1031 Right lower quadrant pain: Secondary | ICD-10-CM | POA: Diagnosis not present

## 2017-01-26 MED ORDER — IOPAMIDOL (ISOVUE-300) INJECTION 61%
100.0000 mL | Freq: Once | INTRAVENOUS | Status: AC | PRN
  Administered 2017-01-26: 100 mL via INTRAVENOUS

## 2017-02-02 DIAGNOSIS — K5901 Slow transit constipation: Secondary | ICD-10-CM | POA: Diagnosis not present

## 2017-02-02 DIAGNOSIS — N183 Chronic kidney disease, stage 3 (moderate): Secondary | ICD-10-CM | POA: Diagnosis not present

## 2017-02-02 DIAGNOSIS — C9110 Chronic lymphocytic leukemia of B-cell type not having achieved remission: Secondary | ICD-10-CM | POA: Diagnosis not present

## 2017-02-02 DIAGNOSIS — B0223 Postherpetic polyneuropathy: Secondary | ICD-10-CM | POA: Diagnosis not present

## 2017-02-02 DIAGNOSIS — B028 Zoster with other complications: Secondary | ICD-10-CM | POA: Diagnosis not present

## 2017-02-19 DIAGNOSIS — K5901 Slow transit constipation: Secondary | ICD-10-CM | POA: Diagnosis not present

## 2017-02-19 DIAGNOSIS — R0609 Other forms of dyspnea: Secondary | ICD-10-CM | POA: Diagnosis not present

## 2017-02-19 DIAGNOSIS — Z79899 Other long term (current) drug therapy: Secondary | ICD-10-CM | POA: Diagnosis not present

## 2017-02-19 DIAGNOSIS — B0223 Postherpetic polyneuropathy: Secondary | ICD-10-CM | POA: Diagnosis not present

## 2017-03-09 DIAGNOSIS — M1712 Unilateral primary osteoarthritis, left knee: Secondary | ICD-10-CM | POA: Diagnosis not present

## 2017-03-13 DIAGNOSIS — M47816 Spondylosis without myelopathy or radiculopathy, lumbar region: Secondary | ICD-10-CM | POA: Diagnosis not present

## 2017-03-13 DIAGNOSIS — M5137 Other intervertebral disc degeneration, lumbosacral region: Secondary | ICD-10-CM | POA: Diagnosis not present

## 2017-03-13 DIAGNOSIS — B0229 Other postherpetic nervous system involvement: Secondary | ICD-10-CM | POA: Diagnosis not present

## 2017-03-23 DIAGNOSIS — K5901 Slow transit constipation: Secondary | ICD-10-CM | POA: Diagnosis not present

## 2017-03-23 DIAGNOSIS — B0223 Postherpetic polyneuropathy: Secondary | ICD-10-CM | POA: Diagnosis not present

## 2017-04-02 ENCOUNTER — Telehealth: Payer: Self-pay | Admitting: Hematology

## 2017-04-02 NOTE — Telephone Encounter (Signed)
Spoke to patient about appointment 9/13.

## 2017-04-25 ENCOUNTER — Other Ambulatory Visit: Payer: Self-pay

## 2017-04-25 ENCOUNTER — Ambulatory Visit: Payer: Self-pay | Admitting: Hematology

## 2017-04-26 ENCOUNTER — Encounter: Payer: Self-pay | Admitting: Hematology

## 2017-04-26 ENCOUNTER — Other Ambulatory Visit (HOSPITAL_BASED_OUTPATIENT_CLINIC_OR_DEPARTMENT_OTHER): Payer: Medicare Other

## 2017-04-26 ENCOUNTER — Ambulatory Visit (HOSPITAL_BASED_OUTPATIENT_CLINIC_OR_DEPARTMENT_OTHER): Payer: Medicare Other | Admitting: Hematology

## 2017-04-26 VITALS — BP 134/80 | HR 89 | Temp 98.6°F | Resp 18 | Ht 65.0 in | Wt 150.2 lb

## 2017-04-26 DIAGNOSIS — C9110 Chronic lymphocytic leukemia of B-cell type not having achieved remission: Secondary | ICD-10-CM

## 2017-04-26 LAB — COMPREHENSIVE METABOLIC PANEL
ALT: 8 U/L (ref 0–55)
AST: 18 U/L (ref 5–34)
Albumin: 3.9 g/dL (ref 3.5–5.0)
Alkaline Phosphatase: 78 U/L (ref 40–150)
Anion Gap: 8 mEq/L (ref 3–11)
BUN: 17.1 mg/dL (ref 7.0–26.0)
CO2: 25 mEq/L (ref 22–29)
Calcium: 10.3 mg/dL (ref 8.4–10.4)
Chloride: 105 mEq/L (ref 98–109)
Creatinine: 1 mg/dL (ref 0.6–1.1)
EGFR: 51 mL/min/{1.73_m2} — ABNORMAL LOW (ref >90)
Glucose: 87 mg/dl (ref 70–140)
Potassium: 4.5 mEq/L (ref 3.5–5.1)
Sodium: 138 mEq/L (ref 136–145)
Total Bilirubin: 0.51 mg/dL (ref 0.20–1.20)
Total Protein: 6.7 g/dL (ref 6.4–8.3)

## 2017-04-26 LAB — CBC & DIFF AND RETIC
BASO%: 0.2 % (ref 0.0–2.0)
Basophils Absolute: 0 10*3/uL (ref 0.0–0.1)
EOS%: 0.6 % (ref 0.0–7.0)
Eosinophils Absolute: 0.1 10*3/uL (ref 0.0–0.5)
HCT: 36.3 % (ref 34.8–46.6)
HGB: 12.2 g/dL (ref 11.6–15.9)
Immature Retic Fract: 1.7 % (ref 1.60–10.00)
LYMPH%: 62.9 % — ABNORMAL HIGH (ref 14.0–49.7)
MCH: 31.7 pg (ref 25.1–34.0)
MCHC: 33.6 g/dL (ref 31.5–36.0)
MCV: 94.3 fL (ref 79.5–101.0)
MONO#: 0.8 10*3/uL (ref 0.1–0.9)
MONO%: 6.6 % (ref 0.0–14.0)
NEUT#: 3.7 10*3/uL (ref 1.5–6.5)
NEUT%: 29.7 % — ABNORMAL LOW (ref 38.4–76.8)
Platelets: 296 10*3/uL (ref 145–400)
RBC: 3.85 10*6/uL (ref 3.70–5.45)
RDW: 15.4 % — ABNORMAL HIGH (ref 11.2–14.5)
Retic %: 1.23 % (ref 0.70–2.10)
Retic Ct Abs: 47.36 10*3/uL (ref 33.70–90.70)
WBC: 12.3 10*3/uL — ABNORMAL HIGH (ref 3.9–10.3)
lymph#: 7.8 10*3/uL — ABNORMAL HIGH (ref 0.9–3.3)
nRBC: 0 % (ref 0–0)

## 2017-04-26 LAB — LACTATE DEHYDROGENASE: LDH: 153 U/L (ref 125–245)

## 2017-04-26 LAB — TECHNOLOGIST REVIEW

## 2017-04-26 NOTE — Progress Notes (Signed)
HEMATOLOGY/ONCOLOGY CLINIC NOTE  Date of Service: 04/26/2017  Patient Care Team: [name], MD as PCP - General (Internal Medicine)  CHIEF COMPLAINTS/PURPOSE OF CONSULTATION:  f/u for CLL  HISTORY OF PRESENTING ILLNESS:   Rebecca King is a wonderful 83 y.o. female who has been referred to [place] by Dr .[name], MD  for evaluation and management of lymphocytosis.  Patient has a history of osteopenia, dyslipidemia, GERD and As per PCP notes a known diagnosis of CLL diagnosed in March 2011. Patient is not very aware of this diagnosis when asked today.  She notes that she recently has had a cough and a cold from early November 2017 which she has had difficulties getting over. Notes that she had some fevers and chills at the time and was treated with azithromycin for 5 days. She also notes that she go to steroid shot.  She notes no objective fevers at this time. She is currently afebrile with a temperature of 98.15F and saturating 97% on room air. She has a history of GERD and notes some upper abdominal discomfort. She had an ultrasound of the abdomen done by her primary care physician on 08/02/2016 which showed no acute intra-abdominal pathology. No splenomegaly..   She reports some increased fatigue and weight loss from 172 down to 160 pounds. She also notes some decreased appetite. She notes that her daughter had ALL and wonders what her blood counts mean.  She notes no overt palpable new lymph nodes. Currently no acute shortness of breath or chest pain.  Labs done in our clinic today showed no anemia with a normal hemoglobin of 12.3, mild leukocytosis of 15.5k with 8.8k lymphocytes. Normal platelets.  She had a chest x-ray which showed no acute infiltrates but overinflation suggestive of COPD. procalcitonin was in normal limits and did not suggest an ongoing bacterial process. Labs also did not show any hypogammaglobinemia.  INTERVAL HISTORY  Rebecca King is here for  follow-up of her CLL. She is still recovering from a bout of shingles over the summer which appeared to her right abdomen extending to the right thigh. She was placed on anti-virals for this to control this. She does still experience some pain over the area of her breakout. She was on Gabapentin for ~6 weeks which controlled her pain, and is now off of this medication. She does report that when her shingles began that she had a new onset of hot flashes and cold sweats as well. These have been intermittent. She denies fever, night sweats, weight loss, or any other associated symptoms.    MEDICAL HISTORY:  Past Medical History:  Diagnosis Date  . Colon polyp   . DJD (degenerative joint disease)   . GERD (gastroesophageal reflux disease)   . Hyperlipidemia   . Neuropathy   . Osteopenia   . Renal cyst   DJD knees Diverticulosis Hearing loss Poor vision CLL March 2011 Microscopic hematuria.  SURGICAL HISTORY: Past Surgical History:  Procedure Laterality Date  . LEFT HEART CATHETERIZATION WITH CORONARY ANGIOGRAM N/A 12/25/2011   Procedure: LEFT HEART CATHETERIZATION WITH CORONARY ANGIOGRAM;  Surgeon: Sinclair Grooms, MD;  Location: [facility] CATH LAB;  Service: Cardiovascular;  Laterality: N/A;    SOCIAL HISTORY: Social History   Social History  . Marital status: Married    Spouse name: N/A  . Number of children: N/A  . Years of education: N/A   Occupational History  . Not on file.   Social History Main Topics  .  Smoking status: Former Research scientist (life sciences)  . Smokeless tobacco: Never Used  . Alcohol use No  . Drug use: No  . Sexual activity: Not on file   Other Topics Concern  . Not on file   Social History Narrative  . No narrative on file    FAMILY HISTORY: Family History  Problem Relation Age of Onset  . Family history unknown: Yes    ALLERGIES:  is allergic to ciprofloxacin and sulfa antibiotics.  MEDICATIONS:  Current Outpatient Prescriptions  Medication Sig Dispense Refill    . amoxicillin-clavulanate (AUGMENTIN) 875-125 MG per tablet Take 1 tablet by mouth every 12 (twelve) hours. 14 tablet 0  . Ascorbic Acid (VITAMIN C PO) Take 1 tablet by mouth daily.    Marland Kitchen aspirin 81 MG tablet Take 81 mg by mouth daily.    Marland Kitchen BIOTIN PO Take 1 tablet by mouth daily.    . Coenzyme Q10 (CO Q 10 PO) Take 1 tablet by mouth daily.    . Ergocalciferol (CALCIFEROL PO) Take 1 tablet by mouth daily.    . Glucosamine-Chondroitin (GLUCOSAMINE CHONDR COMPLEX PO) Take 1 tablet by mouth daily.    Marland Kitchen KRILL OIL PO Take 1 capsule by mouth daily.    . Multiple Vitamin (MULITIVITAMIN WITH MINERALS) TABS Take 1 tablet by mouth daily.    Marland Kitchen NITROSTAT 0.4 MG SL tablet DISSOLVE ONE TABLET UNDERTONGUE AS DIRECTED AS    NEEDED FOR CHEST PAIN 25 tablet 0  . omeprazole (PRILOSEC) 20 MG capsule Take 20 mg by mouth daily.    . rosuvastatin (CRESTOR) 5 MG tablet Take 5 mg by mouth daily.    . traMADol (ULTRAM) 50 MG tablet Take 100 mg by mouth every 6 (six) hours as needed for pain.     No current facility-administered medications for this visit.     REVIEW OF SYSTEMS:    10 Point review of Systems was done is negative except as noted above.  PHYSICAL EXAMINATION: ECOG PERFORMANCE STATUS: 2 - Symptomatic, <50% confined to bed  . Vitals:   04/26/17 1503  BP: 134/80  Pulse: 89  Resp: 18  Temp: 98.6 F (37 C)  SpO2: 100%   Filed Weights   04/26/17 1503  Weight: 150 lb 3.2 oz (68.1 kg)   .Body mass index is 24.99 kg/m.  GENERAL:alert, in no acute distress and comfortable SKIN: skin color, texture, turgor are normal. There area some areas of hyperpigmentation overlying the lateral right lower abdomen and upper thigh from healed shingles.  EYES: normal, conjunctiva are pink and non-injected, sclera clear OROPHARYNX:no exudate, no erythema and lips, buccal mucosa, and tongue normal  NECK: supple, no JVD, thyroid normal size, non-tender, without nodularity LYMPH:  no palpable lymphadenopathy in  the axillary or inguinal areas. Small subcentimeter cervical LN's b/l posterior triangle LUNGS: decreased air entry b/l. No rales no rhonci HEART: regular rate & rhythm,  no murmurs and no lower extremity edema ABDOMEN: abdomen soft, non-tender, normoactive bowel sounds  Musculoskeletal: no cyanosis of digits and no clubbing  PSYCH: alert & oriented x 3 with fluent speech NEURO: no focal motor/sensory deficits  LABORATORY DATA:  I have reviewed the data as listed  . CBC Latest Ref Rng & Units 04/26/2017 11/23/2016 08/18/2016  WBC 3.9 - 10.3 10e3/uL 12.3(H) 12.5(H) 15.5(H)  Hemoglobin 11.6 - 15.9 g/dL 12.2 11.7 12.3  Hematocrit 34.8 - 46.6 % 36.3 34.8 36.7  Platelets 145 - 400 10e3/uL 296 274 294   . CBC    Component Value  Date/Time   WBC 12.3 (H) 04/26/2017 1344   WBC 17.6 (H) 11/01/2012 0244   RBC 3.85 04/26/2017 1344   RBC 4.20 11/01/2012 0244   HGB 12.2 04/26/2017 1344   HCT 36.3 04/26/2017 1344   PLT 296 04/26/2017 1344   MCV 94.3 04/26/2017 1344   MCH 31.7 04/26/2017 1344   MCH 31.7 11/01/2012 0244   MCHC 33.6 04/26/2017 1344   MCHC 35.0 11/01/2012 0244   RDW 15.4 (H) 04/26/2017 1344   LYMPHSABS 7.8 (H) 04/26/2017 1344   MONOABS 0.8 04/26/2017 1344   EOSABS 0.1 04/26/2017 1344   BASOSABS 0.0 04/26/2017 1344     . CMP Latest Ref Rng & Units 04/26/2017 11/23/2016 08/18/2016  Glucose 70 - 140 mg/dl 87 92 94  BUN 7.0 - 26.0 mg/dL 17.1 16.2 20.2  Creatinine 0.6 - 1.1 mg/dL 1.0 1.0 1.0  Sodium 136 - 145 mEq/L 138 140 140  Potassium 3.5 - 5.1 mEq/L 4.5 4.4 4.1  Chloride 96 - 112 mEq/L - - -  CO2 22 - 29 mEq/L 25 27 27   Calcium 8.4 - 10.4 mg/dL 10.3 9.9 10.1  Total Protein 6.4 - 8.3 g/dL 6.7 6.4 6.2  Total Bilirubin 0.20 - 1.20 mg/dL 0.51 0.43 0.44  Alkaline Phos 40 - 150 U/L 78 58 78  AST 5 - 34 U/L 18 19 16   ALT 0 - 55 U/L 8 13 10    . Lab Results  Component Value Date   LDH 153 04/26/2017             RADIOGRAPHIC STUDIES: I have personally reviewed the  radiological images as listed and agreed with the findings in the report. No results found.  ASSESSMENT & PLAN:   83 yo female with   1) Rai Stage 1 Chronic lymphocytic leukemia. Apparently was previously diagnosed in March 2011 but no data available from this. CLL Fish prognostic panel showed predominantly 13q deletion in 63% of the cells. This typically suggests a good prognosis small clone with trisomy 34 which is an intermediate prognosis.   Normal LDH levels. No palpable splenomegaly. No significant cytopenias at this time. Minimal cervical lymphadenopathy. No significant weight loss at this time.  No evidence of hypogammaglobinemia related to CLL, IgG levels tested in Jan were normal. Plan  -Patient's labs today show no new cytopenias. WBC and lymphocyte counts are stable and not significantly changed. -No acute indication for treatment of patient's CLL at this time. -We discussed the findings of her previous CLL Fish prognostic panel. -No additional workup required at this time. -Patient's previous abdominal discomfort and heartburn have resolved with PPI therapy. -She intends to travel in spring and summer and she was encouraged to do this -She needs to follow-up with her primary care physician to ensure yearly influenza vaccination and to make sure she is up-to-date with both her Pneumovax and Prevnar vaccines. -Labs have remained stable overall, no clinical s/s of disease progression at this time warranting treatment.   #2) Shingles- -Pt notes that she had a bout of shingles which is now resolved and began over the summer.  -Rash has crusted over and is healing and pain is overall controlled. No longer on Gabapentin but I did advise her that she is at risk of developing postherpetic neuralgia and that she may need to follow up with her primary care physician for ongoing evaluation and management of this.  We will plan to see her back in 6 months with repeat labs unless any new  concerns  or symptoms involving the interim.  RTC with Dr Irene Limbo in 6 months with labs  All of the patients questions were answered with apparent satisfaction. The patient knows to call the clinic with any problems, questions or concerns.  I spent 20 minutes counseling the patient face to face. The total time spent in the appointment was 25 minutes and more than 50% was on counseling and direct patient cares.    Sullivan Lone MD [place] AAHIVMS [facility] Hematology/Oncology Physician [place]  (Office):       [phone] (Work cell):  [phone] (Fax):           [phone]  04/26/2017 3:03 PM  This document serves as a record of services personally performed by Sullivan Lone, MD. It was created on his behalf by Reola Mosher, a trained medical scribe. The creation of this record is based on the scribe's personal observations and the provider's statements to them. This document has been checked and approved by the attending provider.

## 2017-04-27 ENCOUNTER — Telehealth: Payer: Self-pay | Admitting: Hematology

## 2017-04-27 NOTE — Telephone Encounter (Signed)
Scheduled appt per 9/13 los- lab and f/u in 6 months - reminder letter sent in the mail.

## 2017-05-08 DIAGNOSIS — H524 Presbyopia: Secondary | ICD-10-CM | POA: Diagnosis not present

## 2017-05-08 DIAGNOSIS — H25011 Cortical age-related cataract, right eye: Secondary | ICD-10-CM | POA: Diagnosis not present

## 2017-05-08 DIAGNOSIS — Z961 Presence of intraocular lens: Secondary | ICD-10-CM | POA: Diagnosis not present

## 2017-05-08 DIAGNOSIS — H2511 Age-related nuclear cataract, right eye: Secondary | ICD-10-CM | POA: Diagnosis not present

## 2017-05-22 ENCOUNTER — Ambulatory Visit
Admission: RE | Admit: 2017-05-22 | Discharge: 2017-05-22 | Disposition: A | Discharge status: Home / Self Care | Payer: Medicare Other | Source: Ambulatory Visit | Attending: Internal Medicine | Admitting: Internal Medicine

## 2017-05-22 ENCOUNTER — Other Ambulatory Visit: Payer: Self-pay | Admitting: Internal Medicine

## 2017-05-22 DIAGNOSIS — R072 Precordial pain: Secondary | ICD-10-CM | POA: Diagnosis not present

## 2017-05-22 DIAGNOSIS — R079 Chest pain, unspecified: Secondary | ICD-10-CM

## 2017-05-22 DIAGNOSIS — I712 Thoracic aortic aneurysm, without rupture: Secondary | ICD-10-CM | POA: Insufficient documentation

## 2017-05-22 DIAGNOSIS — Z87891 Personal history of nicotine dependence: Secondary | ICD-10-CM | POA: Insufficient documentation

## 2017-05-22 DIAGNOSIS — Z7982 Long term (current) use of aspirin: Secondary | ICD-10-CM | POA: Insufficient documentation

## 2017-05-22 DIAGNOSIS — R0781 Pleurodynia: Secondary | ICD-10-CM | POA: Diagnosis not present

## 2017-05-22 DIAGNOSIS — Z79899 Other long term (current) drug therapy: Secondary | ICD-10-CM | POA: Diagnosis not present

## 2017-05-22 DIAGNOSIS — R791 Abnormal coagulation profile: Secondary | ICD-10-CM | POA: Diagnosis not present

## 2017-05-23 ENCOUNTER — Emergency Department (HOSPITAL_COMMUNITY): Payer: Medicare Other

## 2017-05-23 ENCOUNTER — Emergency Department (HOSPITAL_COMMUNITY)
Admission: EM | Admit: 2017-05-23 | Discharge: 2017-05-23 | Disposition: A | Discharge status: Home / Self Care | Payer: Medicare Other | Attending: Emergency Medicine | Admitting: Emergency Medicine

## 2017-05-23 ENCOUNTER — Other Ambulatory Visit: Payer: Self-pay | Admitting: Internal Medicine

## 2017-05-23 ENCOUNTER — Encounter (HOSPITAL_COMMUNITY): Payer: Self-pay | Admitting: Emergency Medicine

## 2017-05-23 DIAGNOSIS — R0789 Other chest pain: Secondary | ICD-10-CM

## 2017-05-23 DIAGNOSIS — R079 Chest pain, unspecified: Secondary | ICD-10-CM | POA: Diagnosis not present

## 2017-05-23 DIAGNOSIS — I712 Thoracic aortic aneurysm, without rupture, unspecified: Secondary | ICD-10-CM

## 2017-05-23 DIAGNOSIS — R7989 Other specified abnormal findings of blood chemistry: Secondary | ICD-10-CM

## 2017-05-23 DIAGNOSIS — R072 Precordial pain: Secondary | ICD-10-CM | POA: Diagnosis not present

## 2017-05-23 LAB — I-STAT TROPONIN, ED
TROPONIN I, POC: 0 ng/mL (ref 0.00–0.08)
Troponin i, poc: 0 ng/mL (ref 0.00–0.08)

## 2017-05-23 LAB — CBC
HEMATOCRIT: 33.7 % — AB (ref 36.0–46.0)
HEMOGLOBIN: 10.8 g/dL — AB (ref 12.0–15.0)
MCH: 30 pg (ref 26.0–34.0)
MCHC: 32 g/dL (ref 30.0–36.0)
MCV: 93.6 fL (ref 78.0–100.0)
Platelets: 286 10*3/uL (ref 150–400)
RBC: 3.6 MIL/uL — AB (ref 3.87–5.11)
RDW: 14.9 % (ref 11.5–15.5)
WBC: 12.8 10*3/uL — ABNORMAL HIGH (ref 4.0–10.5)

## 2017-05-23 LAB — BASIC METABOLIC PANEL
ANION GAP: 7 (ref 5–15)
BUN: 18 mg/dL (ref 6–20)
CHLORIDE: 103 mmol/L (ref 101–111)
CO2: 25 mmol/L (ref 22–32)
Calcium: 9.6 mg/dL (ref 8.9–10.3)
Creatinine, Ser: 1.01 mg/dL — ABNORMAL HIGH (ref 0.44–1.00)
GFR calc non Af Amer: 48 mL/min — ABNORMAL LOW (ref >60)
GFR, EST AFRICAN AMERICAN: 55 mL/min — AB (ref >60)
Glucose, Bld: 85 mg/dL (ref 65–99)
Potassium: 4.3 mmol/L (ref 3.5–5.1)
Sodium: 135 mmol/L (ref 135–145)

## 2017-05-23 MED ORDER — IOPAMIDOL (ISOVUE-370) INJECTION 76%
INTRAVENOUS | Status: AC
  Administered 2017-05-23: 100 mL
  Filled 2017-05-23: qty 100

## 2017-05-23 NOTE — ED Notes (Signed)
Patient transported to CT 

## 2017-05-23 NOTE — ED Notes (Signed)
Pt and husband aware we are waiting for repeat troponin. To result. Husband at bedside and pt denies any distress or pain. resp even and nn labored.

## 2017-05-23 NOTE — ED Provider Notes (Signed)
[facility] DEPT Provider Note   CSN: [id number] Arrival date & time: 05/22/17  2355     History   Chief Complaint Chief Complaint  Patient presents with  . Chest Pain    HPI Rebecca King is a 83 y.o. female.  Patient presents to the emergency department for evaluation of chest pain. Patient reports that she has been experiencing intermittent episodes of chest pain recently. She saw her primary care doctor today. He did basic workup and thought she needed a CT of her chest to rule out a PE. He scheduled for tomorrow, but told her to come to the ER if she had worsening symptoms. Tonight she developed a sharp pain in her left chest that worsens when she took deep breath. She came to the ER for further evaluation, pain has since resolved.      Past Medical History:  Diagnosis Date  . Colon polyp   . DJD (degenerative joint disease)   . GERD (gastroesophageal reflux disease)   . Hyperlipidemia   . Neuropathy   . Osteopenia   . Renal cyst     Patient Active Problem List   Diagnosis Date Noted  . Angina pectoris 12/25/2011    Class: Acute  . GERD (gastroesophageal reflux disease) 12/25/2011    Past Surgical History:  Procedure Laterality Date  . LEFT HEART CATHETERIZATION WITH CORONARY ANGIOGRAM N/A 12/25/2011   Procedure: LEFT HEART CATHETERIZATION WITH CORONARY ANGIOGRAM;  Surgeon: Sinclair Grooms, MD;  Location[facility] C[facility] LAB;  Service: Cardiovascular;  Laterality: N/A;    OB History    No data available       Home Medications    Prior to Admission medications   Medication Sig Start Date End Date Taking? Authorizing Provider  Ascorbic Acid (VITAMIN C PO) Take 1 tablet by mouth daily.   Yes [provider]  aspirin 81 MG tablet Take 81 mg by mouth daily.   Yes [provider]  Coenzyme Q10 (CO Q 10 PO) Take 1 tablet by mouth daily.   Yes [provider]  KRILL OIL PO Take 1 capsule by mouth daily.   Yes [provider]    Multiple Vitamin (MULITIVITAMIN WITH MINERALS) TABS Take 1 tablet by mouth daily.   Yes [provider]  NITROSTAT 0.4 MG SL tablet DISSOLVE ONE TABLET UNDERTONGUE AS DIRECTED AS    NEEDED FOR CHEST PAIN 08/31/13  Yes Belva Crome, MD  traMADol (ULTRAM) 50 MG tablet Take 100 mg by mouth every 6 (six) hours as needed for pain.   Yes [provider]    Family History Family History  Problem Relation Age of Onset  . Family history unknown: Yes    Social History Social History  Substance Use Topics  . Smoking status: Former Research scientist (life sciences)  . Smokeless tobacco: Never Used  . Alcohol use No     Allergies   Ciprofloxacin and Sulfa antibiotics   Review of Systems Review of Systems  Cardiovascular: Positive for chest pain.  All other systems reviewed and are negative.    Physical Exam Updated Vital Signs BP (!) 131/54   Pulse 63   Temp 98.1 F (36.7 C) (Oral)   Resp 11   SpO2 97%   Physical Exam  Constitutional: She is oriented to person, place, and time. She appears well-developed and well-nourished. No distress.  HENT:  Head: Normocephalic and atraumatic.  Right Ear: Hearing normal.  Left Ear: Hearing normal.  Nose: Nose normal.  Mouth/Throat:  Oropharynx is clear and moist and mucous membranes are normal.  Eyes: Pupils are equal, round, and reactive to light. Conjunctivae and EOM are normal.  Neck: Normal range of motion. Neck supple.  Cardiovascular: Regular rhythm, S1 normal and S2 normal.  Exam reveals no gallop and no friction rub.   No murmur heard. Pulmonary/Chest: Effort normal and breath sounds normal. No respiratory distress. She exhibits no tenderness.  Abdominal: Soft. Normal appearance and bowel sounds are normal. There is no hepatosplenomegaly. There is no tenderness. There is no rebound, no guarding, no tenderness at McBurney's point and negative Murphy's sign. No hernia.  Musculoskeletal: Normal range of motion.  Neurological: She is alert  and oriented to person, place, and time. She has normal strength. No cranial nerve deficit or sensory deficit. Coordination normal. GCS eye subscore is 4. GCS verbal subscore is 5. GCS motor subscore is 6.  Skin: Skin is warm, dry and intact. No rash noted. No cyanosis.  Psychiatric: She has a normal mood and affect. Her speech is normal and behavior is normal. Thought content normal.  Nursing note and vitals reviewed.    ED Treatments / Results  Labs (all labs ordered are listed, but only abnormal results are displayed) Labs Reviewed  BASIC METABOLIC PANEL - Abnormal; Notable for the following:       Result Value   Creatinine, Ser 1.01 (*)    GFR calc non Af Amer 48 (*)    GFR calc Af Amer 55 (*)    All other components within normal limits  CBC - Abnormal; Notable for the following:    WBC 12.8 (*)    RBC 3.60 (*)    Hemoglobin 10.8 (*)    HCT 33.7 (*)    All other components within normal limits  I-STAT TROPONIN, ED    EKG  EKG Interpretation  Date/Time:  Wednesday May 23 2017 00:16:31 EDT Ventricular Rate:  66 PR Interval:  214 QRS Duration: 82 QT Interval:  396 QTC Calculation: 415 R Axis:   22 Text Interpretation:  Sinus rhythm with 1st degree A-V block Otherwise normal ECG Confirmed by Orpah Greek [phone]) on 05/23/2017 2:31:18 AM       Radiology Dg Chest 2 View  Result Date: 05/23/2017 CLINICAL DATA:  LEFT side chest pain, LEFT neck pain, former smoker EXAM: CHEST  2 VIEW COMPARISON:  05/22/2017 FINDINGS: Enlargement of cardiac silhouette. Atherosclerotic calcifications aorta. Mediastinal contours and pulmonary vascularity normal. Lungs appear emphysematous but clear. No acute infiltrate, pleural effusion or pneumothorax. Diffuse osseous demineralization. IMPRESSION: Enlargement of cardiac silhouette. Emphysematous changes without infiltrate. Electronically Signed   By: Lavonia Dana M.D.   On: 05/23/2017 00:53   Ct Angio Chest Pe W Or Wo  Contrast  Result Date: 05/23/2017 CLINICAL DATA:  Acute onset of generalized chest pain. Initial encounter. EXAM: CT ANGIOGRAPHY CHEST WITH CONTRAST TECHNIQUE: Multidetector CT imaging of the chest was performed using the standard protocol during bolus administration of intravenous contrast. Multiplanar CT image reconstructions and MIPs were obtained to evaluate the vascular anatomy. CONTRAST:  100 mL of Isovue 370 IV contrast COMPARISON:  Chest radiograph performed earlier today at 12:37 a.m. FINDINGS: Cardiovascular:  There is no evidence of pulmonary embolus. There is mild aneurysmal dilatation of the ascending thoracic aorta to 4.2 cm in AP dimension. Mild calcification is noted along the aortic arch and descending thoracic aorta. There is no evidence of aortic dissection. The heart is mildly enlarged. Mild calcification is noted along the proximal great  vessels. Mediastinum/Nodes: No mediastinal lymphadenopathy is seen. No pericardial effusion is identified. A 1.4 cm hypodensity is noted at the right thyroid lobe. This is likely benign given its size. No axillary lymphadenopathy is appreciated. Lungs/Pleura: Mild bibasilar atelectasis is noted. No pleural effusion or pneumothorax seen. No masses are identified. Upper Abdomen: The visualized portions of the liver and spleen are grossly unremarkable. Scattered right renal cysts are noted. A small hiatal hernia is noted. Musculoskeletal: No acute osseous abnormalities are identified. The visualized musculature is unremarkable in appearance. Review of the MIP images confirms the above findings. IMPRESSION: 1. No evidence of pulmonary embolus. 2. Mild aneurysmal dilatation of the ascending thoracic aorta to 4.2 cm in AP dimension. No evidence of aortic dissection. Recommend annual imaging followup by CTA or MRA. This recommendation follows 2010 ACCF/AHA/AATS/ACR/ASA/SCA/SCAI/SIR/STS/SVM Guidelines for the Diagnosis and Management of Patients with Thoracic Aortic  Disease. Circulation. 2010; 121: Z858-I502 3. Mild cardiomegaly. 4. Small hiatal hernia noted. 5. Mild bibasilar atelectasis noted.  Lungs otherwise clear. 6. Scattered right renal cysts noted. Electronically Signed   By: Garald Balding M.D.   On: 05/23/2017 04:11    Procedures Procedures (including critical care time)  Medications Ordered in ED Medications  iopamidol (ISOVUE-370) 76 % injection (100 mLs  Contrast Given 05/23/17 0309)     Initial Impression / Assessment and Plan / ED Course  I have reviewed the triage vital signs and the nursing notes.  Pertinent labs & imaging results that were available during my care of the patient were reviewed by me and considered in my medical decision making (see chart for details).     Patient has been experiencing intermittent episodes of chest pain, predominantly on the left side for a couple of months. She saw her doctor for this earlier today and there was concern over possible PE because she has a pleuritic component. She was to be scheduled for CT angiography tomorrow, but was told to come to the ER if she had any worsening symptoms. Overnight she had an episode of sharp stabbing pains in her left chest that did worsen with breathing, so she came to the ER for evaluation. Her pain had resolved by the time I evaluated her. She has not had any recurrence. CT angiography performed, no acute PE. She does have incidentalmild aneurysmal dilatation of the thoracic aorta, not felt to be contributory to her symptoms. No evidence of dissection. This can be monitored as an outpatient. Troponin negative 2, will discharge, follow-up with primary doctor.  Final Clinical Impressions(s) / ED Diagnoses   Final diagnoses:  Precordial pain  Thoracic aortic aneurysm without rupture [facility])    New Prescriptions New Prescriptions   No medications on file     Orpah Greek, MD 05/23/17 [phone]

## 2017-05-23 NOTE — ED Triage Notes (Signed)
Patient with chest pain on and off today.  Patient states that she went to Dr Sherilyn Cooter office, had blood work and an Copy and was told that she needed a CT of her chest to rule out a blood clot.  Patient awoke tonight from sleep with chest pain in her left chest.  She denies any pain at this time, no shortness of breath.

## 2017-05-28 DIAGNOSIS — J869 Pyothorax without fistula: Secondary | ICD-10-CM | POA: Diagnosis not present

## 2017-05-28 DIAGNOSIS — N183 Chronic kidney disease, stage 3 (moderate): Secondary | ICD-10-CM | POA: Diagnosis not present

## 2017-05-28 DIAGNOSIS — Z23 Encounter for immunization: Secondary | ICD-10-CM | POA: Diagnosis not present

## 2017-05-28 DIAGNOSIS — Z1389 Encounter for screening for other disorder: Secondary | ICD-10-CM | POA: Diagnosis not present

## 2017-05-28 DIAGNOSIS — I712 Thoracic aortic aneurysm, without rupture: Secondary | ICD-10-CM | POA: Diagnosis not present

## 2017-05-28 DIAGNOSIS — I129 Hypertensive chronic kidney disease with stage 1 through stage 4 chronic kidney disease, or unspecified chronic kidney disease: Secondary | ICD-10-CM | POA: Diagnosis not present

## 2017-05-28 DIAGNOSIS — K219 Gastro-esophageal reflux disease without esophagitis: Secondary | ICD-10-CM | POA: Diagnosis not present

## 2017-05-28 DIAGNOSIS — L749 Eccrine sweat disorder, unspecified: Secondary | ICD-10-CM | POA: Diagnosis not present

## 2017-05-28 DIAGNOSIS — G629 Polyneuropathy, unspecified: Secondary | ICD-10-CM | POA: Diagnosis not present

## 2017-05-28 DIAGNOSIS — C9110 Chronic lymphocytic leukemia of B-cell type not having achieved remission: Secondary | ICD-10-CM | POA: Diagnosis not present

## 2017-06-25 ENCOUNTER — Encounter: Payer: Self-pay | Admitting: Interventional Cardiology

## 2017-07-08 DIAGNOSIS — I2510 Atherosclerotic heart disease of native coronary artery without angina pectoris: Secondary | ICD-10-CM | POA: Insufficient documentation

## 2017-07-08 DIAGNOSIS — I712 Thoracic aortic aneurysm, without rupture: Secondary | ICD-10-CM | POA: Insufficient documentation

## 2017-07-08 DIAGNOSIS — I7121 Aneurysm of the ascending aorta, without rupture: Secondary | ICD-10-CM | POA: Insufficient documentation

## 2017-07-08 NOTE — Progress Notes (Signed)
Cardiology Office Note    Date:  07/09/2017   ID:  Rebecca King, DOB Dec 18, 1925, MRN [id number]  PCP:  Rebecca Manes, MD  Cardiologist: Rebecca Grooms, MD   Chief Complaint  Patient presents with  . Thoracic Aortic Aneurysm    History of Present Illness:  Rebecca King is a 83 y.o. female with history of angina pectoris and prior cath demonstrating 70-80% ostial diagonal 2013.Recent chest CT with dillated aorta.  Recent chest discomfort related to an emergency room.  She states the discomfort is sharp continuous.  Worse when she lies on the right side and better if she lies on the left side.  Is associated shortness of breath.  She has never tried nitroglycerin for the discomfort.  The emergency room visit was accompanied by an EKG and delta troponin levels both of which were normal.  A CT scan was performed and revealed mild coronary calcification, no pulmonary embolism, small ascending aortic aneurysm measuring 4.2 cm.  Is caused her concern.  Today for counseling concerning management    Past Medical History:  Diagnosis Date  . Abnormal nuclear stress test 01/26/2009   POST EXERCISE PVC's NOTED SINCE 2003  . CLL (chronic lymphocytic leukemia) ([place]) 10/2009   DR. KALE  . Colon polyp    HYPERPLASTIC  . Diverticulosis   . DJD (degenerative joint disease)   . GERD (gastroesophageal reflux disease)   . Hearing loss    DR. SHOEMAKER  . Hypercholesterolemia   . Hyperlipidemia   . Iron deficiency anemia 05/2010  . Lesion of breast    LEFT, SMALL  . Microscopic hematuria    DR. PETERSON  . Neuropathy    PERIPHERAL  . Osteopenia   . Renal cyst 10/2009   RENAL CYST ON CT 11/2009  . Renal disease    STAGE 3  . Tendon cysts    EXTENSOR 2MM RIGHT FIRST FINGER  . Vitamin D deficiency 10/2011    Past Surgical History:  Procedure Laterality Date  . LEFT HEART CATHETERIZATION WITH CORONARY ANGIOGRAM N/A 12/25/2011   Procedure: LEFT HEART CATHETERIZATION WITH CORONARY  ANGIOGRAM;  Surgeon: Rebecca Grooms, MD;  Location: [facility] CATH LAB;  Service: Cardiovascular;  Laterality: N/A;    Current Medications: Outpatient Medications Prior to Visit  Medication Sig Dispense Refill  . acetaminophen (TYLENOL) 500 MG tablet Take 500 mg every 6 (six) hours as needed by mouth.    . Ascorbic Acid (VITAMIN C PO) Take 1 tablet by mouth daily.    Marland Kitchen aspirin 81 MG tablet Take 81 mg by mouth daily.    . Cholecalciferol (VITAMIN D3) 1000 units CAPS Take 1 capsule daily by mouth.    . Coenzyme Q10 (CO Q 10 PO) Take 1 tablet by mouth daily.    . Coenzyme Q10 (CO Q-10 PO) Take 1 capsule daily by mouth.    Marland Kitchen glucosamine-chondroitin 500-400 MG tablet Take 1 tablet 3 (three) times a week by mouth.    Marland Kitchen KRILL OIL PO Take 1 capsule by mouth daily.    . magnesium hydroxide (MILK OF MAGNESIA) 400 MG/5ML suspension Take 5 mLs 4 (four) times daily as needed by mouth for mild constipation.    . Multiple Vitamin (MULITIVITAMIN WITH MINERALS) TABS Take 1 tablet by mouth daily.    Marland Kitchen NITROSTAT 0.4 MG SL tablet DISSOLVE ONE TABLET UNDERTONGUE AS DIRECTED AS    NEEDED FOR CHEST PAIN 25 tablet 0  . Omega-3 Fatty Acids (FISH OIL) 500  MG CAPS Take 1 capsule 2 (two) times daily by mouth.    . polyethylene glycol (MIRALAX / GLYCOLAX) packet Take 17 g daily by mouth.    . Probiotic Product (ALIGN PO) Take 1 capsule 2 (two) times daily by mouth.    . traMADol (ULTRAM) 50 MG tablet Take 100 mg by mouth every 6 (six) hours as needed for pain.    . vitamin C (ASCORBIC ACID) 500 MG tablet Take 500 mg daily by mouth.    . rosuvastatin (CRESTOR) 5 MG tablet Take 5 mg daily by mouth.     No facility-administered medications prior to visit.      Allergies:   Amoxicillin; Ciprofloxacin; Macrodantin [nitrofurantoin]; and Sulfa antibiotics   Social History   Socioeconomic History  . Marital status: Married    Spouse name: None  . Number of children: None  . Years of education: None  . Highest education  level: None  Social Needs  . Financial resource strain: None  . Food insecurity - worry: None  . Food insecurity - inability: None  . Transportation needs - medical: None  . Transportation needs - non-medical: None  Occupational History  . Occupation: UNEMPLOYED  Tobacco Use  . Smoking status: Former Smoker    Last attempt to quit: 06/25/1980    Years since quitting: 37.0  . Smokeless tobacco: Never Used  Substance and Sexual Activity  . Alcohol use: Yes  . Drug use: No  . Sexual activity: None  Other Topics Concern  . None  Social History Narrative  . None     Family History:  The patient's Family history is unknown by patient.   ROS:   Please see the history of present illness.    Interim diagnosis of CLL.  Known coronary disease diagnosed by cath several years ago involving primarily a single diagonal.  Other medical problems includes recent shingles, lumbar disc disease, gastroesophageal reflux, bilateral knee arthritis. All other systems reviewed and are negative.   PHYSICAL EXAM:   VS:  BP 122/60   Pulse 75   Ht 5\' 4"  (1.626 m)   Wt 153 lb 1.9 oz (69.5 kg)   BMI 26.28 kg/m    GEN: Well nourished, well developed, in no acute distress  HEENT: normal  Neck: no JVD, carotid bruits, or masses Cardiac: RRR; rubs, or gallops,no edema.  There is a soft 1/6 systolic murmur at the right upper sternal border. Respiratory:  clear to auscultation bilaterally, normal work of breathing GI: soft, nontender, nondistended, + BS MS: no deformity or atrophy  Skin: warm and dry, no rash Neuro:  Alert and Oriented x 3, Strength and sensation are intact Psych: euthymic mood, full affect  Wt Readings from Last 3 Encounters:  07/09/17 153 lb 1.9 oz (69.5 kg)  04/26/17 150 lb 3.2 oz (68.1 kg)  11/23/16 159 lb 11.2 oz (72.4 kg)      Studies/Labs Reviewed:   EKG:  EKG performed on May 23, 2017 revealed normal sinus rhythm with first-degree AV block.  Recent  Labs: 04/26/2017: ALT 8 05/23/2017: BUN 18; Creatinine, Ser 1.01; Hemoglobin 10.8; Platelets 286; Potassium 4.3; Sodium 135   Lipid Panel No results found for: CHOL, TRIG, HDL, CHOLHDL, VLDL, LDLCALC, LDLDIRECT  Additional studies/ records that were reviewed today include:  CHEST CT SCAN 06/2017:  IMPRESSION: 1. No evidence of pulmonary embolus. 2. Mild aneurysmal dilatation of the ascending thoracic aorta to 4.2 cm in AP dimension. No evidence of aortic dissection. Recommend annual imaging  followup by CTA or MRA. This recommendation follows 2010 ACCF/AHA/AATS/ACR/ASA/SCA/SCAI/SIR/STS/SVM Guidelines for the Diagnosis and Management of Patients with Thoracic Aortic Disease. Circulation. 2010; 121: W803-O122 3. Mild cardiomegaly. 4. Small hiatal hernia noted. 5. Mild bibasilar atelectasis noted.  Lungs otherwise clear. 6. Scattered right renal cysts noted. 7. Coronary calcification   ASSESSMENT:    1. Thoracic aortic aneurysm without rupture ([place])   2. Angina pectoris ([place])   3. CAD in native artery   4. CLL (chronic lymphocytic leukemia) ([place])   5. Lumbar disc disease      PLAN:  In order of problems listed above:  1. She will have a 2D Doppler echocardiogram performed to assess aortic root size by echo.  CT scan with contrast to assess aortic diameter in 6 months.  Metoprolol succinate 12.5 mg to decrease expansile force.  We discussed the indications for return including 5 cm upper limit to start considering aortic root therapy but at her age it would probably need to be greater than 5.5 or pre-rupture.  We discussed the importance of blood pressure control. 2. Chest discomfort currently probably represents musculoskeletal or GI discomfort.  We will get a 2D Doppler echocardiogram to exclude pericardial disease.Reinstructed the patient on use of sublingual nitroglycerin. 3. This is a significant comorbidity for the patient.  She is of the mindset to have conservative therapy  over the years.  Doppler echocardiogram is being done to, pericardial disease, other.    Medication Adjustments/Labs and Tests Ordered: Current medicines are reviewed at length with the patient today.  Concerns regarding medicines are outlined above.  Medication changes, Labs and Tests ordered today are listed in the Patient Instructions below. Patient Instructions  Medication Instructions:  1) START Metorpolol Succinate 12.5mg  once daily.  Take this in the morning with food.  Labwork: None  Testing/Procedures: Your physician has requested that you have an echocardiogram. Echocardiography is a painless test that uses sound waves to create images of your heart. It provides your doctor with information about the size and shape of your heart and how well your heart's chambers and valves are working. This procedure takes approximately one hour. There are no restrictions for this procedure.  Your physician recommends that you have a Chest CT in 6 months prior to seeing him back.   Follow-Up: Your physician wants you to follow-up in: 6 months with Dr. Tamala Julian.  You will receive a reminder letter in the mail two months in advance. If you don't receive a letter, please call our office to schedule the follow-up appointment.   Any Other Special Instructions Will Be Listed Below (If Applicable).     If you need a refill on your cardiac medications before your next appointment, please call your pharmacy.      Signed, Rebecca Grooms, MD  07/09/2017 9:35 AM    [facility] [address], [place], [place]  [address] Phone: [phone]; Fax: [phone]

## 2017-07-09 ENCOUNTER — Ambulatory Visit (INDEPENDENT_AMBULATORY_CARE_PROVIDER_SITE_OTHER): Payer: Medicare Other | Admitting: Interventional Cardiology

## 2017-07-09 ENCOUNTER — Encounter: Payer: Self-pay | Admitting: Interventional Cardiology

## 2017-07-09 VITALS — BP 122/60 | HR 75 | Ht 64.0 in | Wt 153.1 lb

## 2017-07-09 DIAGNOSIS — I712 Thoracic aortic aneurysm, without rupture, unspecified: Secondary | ICD-10-CM

## 2017-07-09 DIAGNOSIS — M519 Unspecified thoracic, thoracolumbar and lumbosacral intervertebral disc disorder: Secondary | ICD-10-CM

## 2017-07-09 DIAGNOSIS — C9190 Lymphoid leukemia, unspecified not having achieved remission: Secondary | ICD-10-CM

## 2017-07-09 DIAGNOSIS — C9110 Chronic lymphocytic leukemia of B-cell type not having achieved remission: Secondary | ICD-10-CM

## 2017-07-09 DIAGNOSIS — I209 Angina pectoris, unspecified: Secondary | ICD-10-CM

## 2017-07-09 MED ORDER — METOPROLOL SUCCINATE ER 25 MG PO TB24: 12.5000 mg | ORAL_TABLET | Freq: Every day | ORAL | 3 refills | Status: DC

## 2017-07-09 NOTE — Addendum Note (Signed)
Addended by: Belva Crome on: 07/09/2017 10:36 AM   Modules accepted: Level of Service

## 2017-07-09 NOTE — Patient Instructions (Signed)
Medication Instructions:  1) START Metorpolol Succinate 12.5mg  once daily.  Take this in the morning with food.  Labwork: None  Testing/Procedures: Your physician has requested that you have an echocardiogram. Echocardiography is a painless test that uses sound waves to create images of your heart. It provides your doctor with information about the size and shape of your heart and how well your heart's chambers and valves are working. This procedure takes approximately one hour. There are no restrictions for this procedure.  Your physician recommends that you have a Chest CT in 6 months prior to seeing him back.   Follow-Up: Your physician wants you to follow-up in: 6 months with Dr. Tamala Julian.  You will receive a reminder letter in the mail two months in advance. If you don't receive a letter, please call our office to schedule the follow-up appointment.   Any Other Special Instructions Will Be Listed Below (If Applicable).     If you need a refill on your cardiac medications before your next appointment, please call your pharmacy.

## 2017-07-24 ENCOUNTER — Other Ambulatory Visit (HOSPITAL_COMMUNITY): Payer: Medicare Other

## 2017-07-26 ENCOUNTER — Other Ambulatory Visit (HOSPITAL_COMMUNITY): Payer: Medicare Other

## 2017-08-02 ENCOUNTER — Other Ambulatory Visit: Payer: Self-pay

## 2017-08-02 ENCOUNTER — Ambulatory Visit (HOSPITAL_COMMUNITY): Payer: Medicare Other | Attending: Interventional Cardiology

## 2017-08-02 DIAGNOSIS — I371 Nonrheumatic pulmonary valve insufficiency: Secondary | ICD-10-CM | POA: Diagnosis not present

## 2017-08-02 DIAGNOSIS — I081 Rheumatic disorders of both mitral and tricuspid valves: Secondary | ICD-10-CM | POA: Diagnosis not present

## 2017-08-02 DIAGNOSIS — I712 Thoracic aortic aneurysm, without rupture, unspecified: Secondary | ICD-10-CM

## 2017-08-06 ENCOUNTER — Telehealth: Payer: Self-pay | Admitting: *Deleted

## 2017-08-06 NOTE — Telephone Encounter (Signed)
-----   Message from Belva Crome, MD sent at 08/04/2017  9:45 AM EST ----- Let the patient know the echocardiogram shows normal heart size and function. Mils aortic enlargement as previously demonstrated. A copy will be sent to [name], MD

## 2017-08-06 NOTE — Telephone Encounter (Signed)
Pt has been notified of echo results/findings by phone with verbal understanding. Pt thanked me for my call today. Will forward results to Dr. Felipa Eth as well.

## 2017-08-24 DIAGNOSIS — H2511 Age-related nuclear cataract, right eye: Secondary | ICD-10-CM | POA: Diagnosis not present

## 2017-08-24 DIAGNOSIS — H35372 Puckering of macula, left eye: Secondary | ICD-10-CM | POA: Diagnosis not present

## 2017-08-24 DIAGNOSIS — H52203 Unspecified astigmatism, bilateral: Secondary | ICD-10-CM | POA: Diagnosis not present

## 2017-08-24 DIAGNOSIS — H25011 Cortical age-related cataract, right eye: Secondary | ICD-10-CM | POA: Diagnosis not present

## 2017-08-29 DIAGNOSIS — C9110 Chronic lymphocytic leukemia of B-cell type not having achieved remission: Secondary | ICD-10-CM | POA: Diagnosis not present

## 2017-08-29 DIAGNOSIS — B0229 Other postherpetic nervous system involvement: Secondary | ICD-10-CM | POA: Diagnosis not present

## 2017-08-29 DIAGNOSIS — K219 Gastro-esophageal reflux disease without esophagitis: Secondary | ICD-10-CM | POA: Diagnosis not present

## 2017-08-31 DIAGNOSIS — M1712 Unilateral primary osteoarthritis, left knee: Secondary | ICD-10-CM | POA: Diagnosis not present

## 2017-09-21 DIAGNOSIS — M179 Osteoarthritis of knee, unspecified: Secondary | ICD-10-CM | POA: Insufficient documentation

## 2017-09-21 DIAGNOSIS — M1710 Unilateral primary osteoarthritis, unspecified knee: Secondary | ICD-10-CM | POA: Insufficient documentation

## 2017-09-21 DIAGNOSIS — M1712 Unilateral primary osteoarthritis, left knee: Secondary | ICD-10-CM | POA: Diagnosis not present

## 2017-09-27 DIAGNOSIS — M1712 Unilateral primary osteoarthritis, left knee: Secondary | ICD-10-CM | POA: Diagnosis not present

## 2017-10-04 DIAGNOSIS — M1712 Unilateral primary osteoarthritis, left knee: Secondary | ICD-10-CM | POA: Diagnosis not present

## 2017-10-24 NOTE — Progress Notes (Signed)
HEMATOLOGY/ONCOLOGY CLINIC NOTE  Date of Service: 10/25/2017  Patient Care Team: [name], MD as PCP - General (Internal Medicine)  CHIEF COMPLAINTS/PURPOSE OF CONSULTATION:   F/u for CLL  HISTORY OF PRESENTING ILLNESS:   Rebecca King is a wonderful 84 y.o. female who has been referred to [place] by Dr .[name], MD  for evaluation and management of lymphocytosis.  Patient has a history of osteopenia, dyslipidemia, GERD and As per PCP notes a known diagnosis of CLL diagnosed in March 2011. Patient is not very aware of this diagnosis when asked today.  She notes that she recently has had a cough and a cold from early November 2017 which she has had difficulties getting over. Notes that she had some fevers and chills at the time and was treated with azithromycin for 5 days. She also notes that she go to steroid shot.  She notes no objective fevers at this time. She is currently afebrile with a temperature of 98.33F and saturating 97% on room air. She has a history of GERD and notes some upper abdominal discomfort. She had an ultrasound of the abdomen done by her primary care physician on 08/02/2016 which showed no acute intra-abdominal pathology. No splenomegaly..   She reports some increased fatigue and weight loss from 172 down to 160 pounds. She also notes some decreased appetite. She notes that her daughter had ALL and wonders what her blood counts mean.  She notes no overt palpable new lymph nodes. Currently no acute shortness of breath or chest pain.  Labs done in our clinic today showed no anemia with a normal hemoglobin of 12.3, mild leukocytosis of 15.5k with 8.8k lymphocytes. Normal platelets.  She had a chest x-ray which showed no acute infiltrates but overinflation suggestive of COPD. procalcitonin was in normal limits and did not suggest an ongoing bacterial process. Labs also did not show any hypogammaglobinemia.  INTERVAL HISTORY  [name] is here for  follow-up of her CLL. The patient's last visit with [place] was on 04/26/17. She is accompanied today by her husband. The pt reports that she is doing well overall.   She notes that her shingles has settled down and that her rashes have begun healing. She notes that she had shingles once in the 1980s as well.  She notes that she has been taking Cortisone for her knee pain for a long time, until 8 months ago.   Lab results today (10/25/17) of CBC, CMP, and Reticulocytes is as follows: all values are WNL except for WBC at 13.5k, RDW at 15, Lymphs Abs at 8.0k, Monocytes Abs at 1.1k.  LDH 10/25/17 is WNL at 143.   On review of systems, pt reports some back and knee pain, resolving shingles, and denies abdominal pains, leg swelling, and any other symptoms.   MEDICAL HISTORY:  Past Medical History:  Diagnosis Date  . Abnormal nuclear stress test 01/26/2009   POST EXERCISE PVC's NOTED SINCE 2003  . CLL (chronic lymphocytic leukemia) ([place]) 10/2009   DR. Desirae Mancusi  . Colon polyp    HYPERPLASTIC  . Diverticulosis   . DJD (degenerative joint disease)   . GERD (gastroesophageal reflux disease)   . Hearing loss    DR. SHOEMAKER  . Hypercholesterolemia   . Hyperlipidemia   . Iron deficiency anemia 05/2010  . Lesion of breast    LEFT, SMALL  . Microscopic hematuria    DR. PETERSON  . Neuropathy    PERIPHERAL  . Osteopenia   .  Renal cyst 10/2009   RENAL CYST ON CT 11/2009  . Renal disease    STAGE 3  . Tendon cysts    EXTENSOR 2MM RIGHT FIRST FINGER  . Vitamin D deficiency 10/2011  DJD knees Diverticulosis Hearing loss Poor vision CLL March 2011 Microscopic hematuria.  SURGICAL HISTORY: Past Surgical History:  Procedure Laterality Date  . LEFT HEART CATHETERIZATION WITH CORONARY ANGIOGRAM N/A 12/25/2011   Procedure: LEFT HEART CATHETERIZATION WITH CORONARY ANGIOGRAM;  Surgeon: Sinclair Grooms, MD;  Location: [facility] CATH LAB;  Service: Cardiovascular;  Laterality: N/A;    SOCIAL  HISTORY: Social History   Socioeconomic History  . Marital status: Married    Spouse name: Not on file  . Number of children: Not on file  . Years of education: Not on file  . Highest education level: Not on file  Social Needs  . Financial resource strain: Not on file  . Food insecurity - worry: Not on file  . Food insecurity - inability: Not on file  . Transportation needs - medical: Not on file  . Transportation needs - non-medical: Not on file  Occupational History  . Occupation: UNEMPLOYED  Tobacco Use  . Smoking status: Former Smoker    Last attempt to quit: 06/25/1980    Years since quitting: 37.3  . Smokeless tobacco: Never Used  Substance and Sexual Activity  . Alcohol use: Yes  . Drug use: No  . Sexual activity: Not on file  Other Topics Concern  . Not on file  Social History Narrative  . Not on file    FAMILY HISTORY: Family History  Family history unknown: Yes    ALLERGIES:  is allergic to amoxicillin; ciprofloxacin; macrodantin [nitrofurantoin]; and sulfa antibiotics.  MEDICATIONS:  Current Outpatient Medications  Medication Sig Dispense Refill  . acetaminophen (TYLENOL) 500 MG tablet Take 500 mg every 6 (six) hours as needed by mouth.    . Ascorbic Acid (VITAMIN C PO) Take 1 tablet by mouth daily.    Marland Kitchen aspirin 81 MG tablet Take 81 mg by mouth daily.    . Cholecalciferol (VITAMIN D3) 1000 units CAPS Take 1 capsule daily by mouth.    . Coenzyme Q10 (CO Q 10 PO) Take 1 tablet by mouth daily.    . Coenzyme Q10 (CO Q-10 PO) Take 1 capsule daily by mouth.    Marland Kitchen glucosamine-chondroitin 500-400 MG tablet Take 1 tablet 3 (three) times a week by mouth.    Marland Kitchen KRILL OIL PO Take 1 capsule by mouth daily.    . Multiple Vitamin (MULITIVITAMIN WITH MINERALS) TABS Take 1 tablet by mouth daily.    Marland Kitchen NITROSTAT 0.4 MG SL tablet DISSOLVE ONE TABLET UNDERTONGUE AS DIRECTED AS    NEEDED FOR CHEST PAIN 25 tablet 0  . Omega-3 Fatty Acids (FISH OIL) 500 MG CAPS Take 1 capsule 2  (two) times daily by mouth.    . polyethylene glycol (MIRALAX / GLYCOLAX) packet Take 17 g daily by mouth.    . Probiotic Product (ALIGN PO) Take 1 capsule 2 (two) times daily by mouth.    . traMADol (ULTRAM) 50 MG tablet Take 100 mg by mouth every 6 (six) hours as needed for pain.    . vitamin C (ASCORBIC ACID) 500 MG tablet Take 500 mg daily by mouth.    . magnesium hydroxide (MILK OF MAGNESIA) 400 MG/5ML suspension Take 5 mLs 4 (four) times daily as needed by mouth for mild constipation.    . metoprolol succinate (TOPROL-XL)  25 MG 24 hr tablet Take 0.5 tablets (12.5 mg total) by mouth daily. (Patient not taking: Reported on 10/25/2017) 45 tablet 3   No current facility-administered medications for this visit.     REVIEW OF SYSTEMS:    .10 Point review of Systems was done is negative except as noted above.   PHYSICAL EXAMINATION: ECOG PERFORMANCE STATUS: 2 - Symptomatic, <50% confined to bed  . Vitals:   10/25/17 1444  BP: 139/77  Pulse: 83  Resp: 18  Temp: 98.7 F (37.1 C)  SpO2: 98%   Filed Weights   10/25/17 1444  Weight: 151 lb 1.6 oz (68.5 kg)   .Body mass index is 25.94 kg/m.  Marland Kitchen GENERAL:alert, in no acute distress and comfortable SKIN: no acute rashes, no significant lesions EYES: conjunctiva are pink and non-injected, sclera anicteric OROPHARYNX: MMM, no exudates, no oropharyngeal erythema or ulceration NECK: supple, no JVD LYMPH:  no palpable lymphadenopathy in the cervical, axillary or inguinal regions LUNGS: clear to auscultation b/l with normal respiratory effort HEART: regular rate & rhythm ABDOMEN:  normoactive bowel sounds , non tender, not distended. Extremity: no pedal edema PSYCH: alert & oriented x 3 with fluent speech NEURO: no focal motor/sensory deficits  LABORATORY DATA:  I have reviewed the data as listed  . CBC Latest Ref Rng & Units 10/25/2017 05/23/2017 04/26/2017  WBC 3.9 - 10.3 K/uL 13.5(H) 12.8(H) 12.3(H)  Hemoglobin 12.0 - 15.0 g/dL  - 10.8(L) 12.2  Hematocrit 34.8 - 46.6 % 37.3 33.7(L) 36.3  Platelets 145 - 400 K/uL 307 286 296   . CBC    Component Value Date/Time   WBC 13.5 (H) 10/25/2017 1357   WBC 12.8 (H) 05/23/2017 0026   RBC 4.04 10/25/2017 1357   RBC 4.04 10/25/2017 1357   HGB 10.8 (L) 05/23/2017 0026   HGB 12.2 04/26/2017 1344   HCT 37.3 10/25/2017 1357   HCT 36.3 04/26/2017 1344   PLT 307 10/25/2017 1357   PLT 296 04/26/2017 1344   MCV 92.3 10/25/2017 1357   MCV 94.3 04/26/2017 1344   MCH 30.7 10/25/2017 1357   MCHC 33.2 10/25/2017 1357   RDW 15.0 (H) 10/25/2017 1357   RDW 15.4 (H) 04/26/2017 1344   LYMPHSABS 8.0 (H) 10/25/2017 1357   LYMPHSABS 7.8 (H) 04/26/2017 1344   MONOABS 1.1 (H) 10/25/2017 1357   MONOABS 0.8 04/26/2017 1344   EOSABS 0.2 10/25/2017 1357   EOSABS 0.1 04/26/2017 1344   BASOSABS 0.0 10/25/2017 1357   BASOSABS 0.0 04/26/2017 1344   . CMP Latest Ref Rng & Units 10/25/2017 05/23/2017 04/26/2017  Glucose 70 - 140 mg/dL 90 85 87  BUN 7 - 26 mg/dL 15 18 17.1  Creatinine 0.60 - 1.10 mg/dL 1.01 1.01(H) 1.0  Sodium 136 - 145 mmol/L 138 135 138  Potassium 3.5 - 5.1 mmol/L 4.1 4.3 4.5  Chloride 98 - 109 mmol/L 105 103 -  CO2 22 - 29 mmol/L 27 25 25   Calcium 8.4 - 10.4 mg/dL 10.0 9.6 10.3  Total Protein 6.4 - 8.3 g/dL 7.1 - 6.7  Total Bilirubin 0.2 - 1.2 mg/dL 0.5 - 0.51  Alkaline Phos 40 - 150 U/L 82 - 78  AST 5 - 34 U/L 18 - 18  ALT 0 - 55 U/L 11 - 8   . Lab Results  Component Value Date   LDH 153 04/26/2017             RADIOGRAPHIC STUDIES: I have personally reviewed the radiological images as listed and  agreed with the findings in the report. No results found.  ASSESSMENT & PLAN:   84 yo female with   1) Rai Stage 1 Chronic lymphocytic leukemia. Apparently was previously diagnosed in March 2011 but no data available from this. CLL Fish prognostic panel showed predominantly 13q deletion in 63% of the cells. This typically suggests a good prognosis small  clone with trisomy 8 which is an intermediate prognosis.   Normal LDH levels. No palpable splenomegaly. No significant cytopenias at this time. Minimal cervical lymphadenopathy. No significant weight loss at this time.  No evidence of hypogammaglobinemia related to CLL, IgG levels tested in Jan were normal.  Plan  --Discussed pt labwork today; Lymphocytes are stable at 8k.  -Discussed that the pt should be in contact with a dermatologist given the higher incidence of non-melanoma skin cancers associated with 13Q deletions. -Recommended having both pneumonia vaccines, pt will follow up with Dr. Felipa Eth about this.  -There is no clinical or lab indication to treat her CLL at this time; we will continue close monitoring and follow up. No splenomegaly.  -Discussed using a component shingles vaccine with pt and her husband.   #2) Shingles- Resolved.  RTC with Dr Irene Limbo in 6 months with labs   All of the patients questions were answered with apparent satisfaction. The patient knows to call the clinic with any problems, questions or concerns.  . The total time spent in the appointment was 15 minutes and more than 50% was on counseling and direct patient cares.      Sullivan Lone MD [place] AAHIVMS [facility]ology/Oncology Physician [place]  (Office):       [phone] (Work cell):  [phone] (Fax):           [phone]  10/25/2017 3:04 PM  This document serves as a record of services personally performed by Sullivan Lone, MD. It was created on his behalf by Baldwin [place], a trained medical scribe. The creation of this record is based on the scribe's personal observations and the provider's statements to them.   .I have reviewed the above documentation for accuracy and completeness, and I agree with the above. Brunetta Genera MD MS

## 2017-10-25 ENCOUNTER — Inpatient Hospital Stay: Payer: Medicare Other

## 2017-10-25 ENCOUNTER — Inpatient Hospital Stay: Payer: Medicare Other | Attending: Hematology | Admitting: Hematology

## 2017-10-25 ENCOUNTER — Telehealth: Payer: Self-pay

## 2017-10-25 ENCOUNTER — Encounter: Payer: Self-pay | Admitting: Hematology

## 2017-10-25 VITALS — BP 139/77 | HR 83 | Temp 98.7°F | Resp 18 | Ht 64.0 in | Wt 151.1 lb

## 2017-10-25 DIAGNOSIS — C9110 Chronic lymphocytic leukemia of B-cell type not having achieved remission: Secondary | ICD-10-CM

## 2017-10-25 LAB — COMPREHENSIVE METABOLIC PANEL
ALT: 11 U/L (ref 0–55)
AST: 18 U/L (ref 5–34)
Albumin: 3.9 g/dL (ref 3.5–5.0)
Alkaline Phosphatase: 82 U/L (ref 40–150)
Anion gap: 6 (ref 3–11)
BUN: 15 mg/dL (ref 7–26)
CHLORIDE: 105 mmol/L (ref 98–109)
CO2: 27 mmol/L (ref 22–29)
CREATININE: 1.01 mg/dL (ref 0.60–1.10)
Calcium: 10 mg/dL (ref 8.4–10.4)
GFR, EST AFRICAN AMERICAN: 55 mL/min — AB (ref >60)
GFR, EST NON AFRICAN AMERICAN: 48 mL/min — AB (ref >60)
Glucose, Bld: 90 mg/dL (ref 70–140)
POTASSIUM: 4.1 mmol/L (ref 3.5–5.1)
SODIUM: 138 mmol/L (ref 136–145)
Total Bilirubin: 0.5 mg/dL (ref 0.2–1.2)
Total Protein: 7.1 g/dL (ref 6.4–8.3)

## 2017-10-25 LAB — CBC WITH DIFFERENTIAL (CANCER CENTER ONLY)
BASOS PCT: 0 %
Basophils Absolute: 0 10*3/uL (ref 0.0–0.1)
Eosinophils Absolute: 0.2 10*3/uL (ref 0.0–0.5)
Eosinophils Relative: 1 %
HEMATOCRIT: 37.3 % (ref 34.8–46.6)
HEMOGLOBIN: 12.4 g/dL (ref 11.6–15.9)
LYMPHS PCT: 60 %
Lymphs Abs: 8 10*3/uL — ABNORMAL HIGH (ref 0.9–3.3)
MCH: 30.7 pg (ref 25.1–34.0)
MCHC: 33.2 g/dL (ref 31.5–36.0)
MCV: 92.3 fL (ref 79.5–101.0)
Monocytes Absolute: 1.1 10*3/uL — ABNORMAL HIGH (ref 0.1–0.9)
Monocytes Relative: 8 %
NEUTROS ABS: 4.2 10*3/uL (ref 1.5–6.5)
NEUTROS PCT: 31 %
Platelet Count: 307 10*3/uL (ref 145–400)
RBC: 4.04 MIL/uL (ref 3.70–5.45)
RDW: 15 % — ABNORMAL HIGH (ref 11.2–14.5)
WBC: 13.5 10*3/uL — AB (ref 3.9–10.3)

## 2017-10-25 LAB — LACTATE DEHYDROGENASE: LDH: 143 U/L (ref 125–245)

## 2017-10-25 LAB — RETICULOCYTES
RBC.: 4.04 MIL/uL (ref 3.70–5.45)
RETIC COUNT ABSOLUTE: 36.4 10*3/uL (ref 33.7–90.7)
Retic Ct Pct: 0.9 % (ref 0.7–2.1)

## 2017-10-25 NOTE — Telephone Encounter (Signed)
Printed avs and calender of upcoming appointment. Per 3/14 los 

## 2017-11-21 DIAGNOSIS — M1712 Unilateral primary osteoarthritis, left knee: Secondary | ICD-10-CM | POA: Diagnosis not present

## 2017-12-28 DIAGNOSIS — E7800 Pure hypercholesterolemia, unspecified: Secondary | ICD-10-CM | POA: Diagnosis not present

## 2017-12-28 DIAGNOSIS — Z23 Encounter for immunization: Secondary | ICD-10-CM | POA: Diagnosis not present

## 2017-12-28 DIAGNOSIS — Z79899 Other long term (current) drug therapy: Secondary | ICD-10-CM | POA: Diagnosis not present

## 2017-12-28 DIAGNOSIS — B0229 Other postherpetic nervous system involvement: Secondary | ICD-10-CM | POA: Diagnosis not present

## 2017-12-28 DIAGNOSIS — G8929 Other chronic pain: Secondary | ICD-10-CM | POA: Diagnosis not present

## 2017-12-28 DIAGNOSIS — G629 Polyneuropathy, unspecified: Secondary | ICD-10-CM | POA: Diagnosis not present

## 2017-12-28 DIAGNOSIS — Z0000 Encounter for general adult medical examination without abnormal findings: Secondary | ICD-10-CM | POA: Diagnosis not present

## 2017-12-28 DIAGNOSIS — R413 Other amnesia: Secondary | ICD-10-CM | POA: Diagnosis not present

## 2017-12-28 DIAGNOSIS — N183 Chronic kidney disease, stage 3 (moderate): Secondary | ICD-10-CM | POA: Diagnosis not present

## 2017-12-28 DIAGNOSIS — I700 Atherosclerosis of aorta: Secondary | ICD-10-CM | POA: Diagnosis not present

## 2017-12-28 DIAGNOSIS — I712 Thoracic aortic aneurysm, without rupture: Secondary | ICD-10-CM | POA: Diagnosis not present

## 2017-12-28 DIAGNOSIS — C9110 Chronic lymphocytic leukemia of B-cell type not having achieved remission: Secondary | ICD-10-CM | POA: Diagnosis not present

## 2017-12-28 DIAGNOSIS — Z1389 Encounter for screening for other disorder: Secondary | ICD-10-CM | POA: Diagnosis not present

## 2018-01-03 ENCOUNTER — Other Ambulatory Visit: Payer: Medicare Other | Admitting: *Deleted

## 2018-01-03 DIAGNOSIS — I712 Thoracic aortic aneurysm, without rupture, unspecified: Secondary | ICD-10-CM

## 2018-01-04 LAB — BASIC METABOLIC PANEL
BUN / CREAT RATIO: 19 (ref 12–28)
BUN: 18 mg/dL (ref 10–36)
CO2: 24 mmol/L (ref 20–29)
CREATININE: 0.95 mg/dL (ref 0.57–1.00)
Calcium: 10.2 mg/dL (ref 8.7–10.3)
Chloride: 98 mmol/L (ref 96–106)
GFR calc Af Amer: 61 mL/min/{1.73_m2} (ref >59)
GFR calc non Af Amer: 53 mL/min/{1.73_m2} — ABNORMAL LOW (ref >59)
Glucose: 100 mg/dL — ABNORMAL HIGH (ref 65–99)
POTASSIUM: 4.8 mmol/L (ref 3.5–5.2)
SODIUM: 136 mmol/L (ref 134–144)

## 2018-01-09 ENCOUNTER — Ambulatory Visit (INDEPENDENT_AMBULATORY_CARE_PROVIDER_SITE_OTHER)
Admission: RE | Admit: 2018-01-09 | Discharge: 2018-01-09 | Disposition: A | Discharge status: Home / Self Care | Payer: Medicare Other | Source: Ambulatory Visit | Attending: Interventional Cardiology | Admitting: Interventional Cardiology

## 2018-01-09 DIAGNOSIS — I712 Thoracic aortic aneurysm, without rupture, unspecified: Secondary | ICD-10-CM

## 2018-01-09 MED ORDER — IOPAMIDOL (ISOVUE-370) INJECTION 76%
100.0000 mL | Freq: Once | INTRAVENOUS | Status: AC | PRN
  Administered 2018-01-09: 100 mL via INTRAVENOUS

## 2018-01-10 DIAGNOSIS — M545 Low back pain: Secondary | ICD-10-CM | POA: Diagnosis not present

## 2018-01-10 DIAGNOSIS — G894 Chronic pain syndrome: Secondary | ICD-10-CM | POA: Diagnosis not present

## 2018-01-10 DIAGNOSIS — M5136 Other intervertebral disc degeneration, lumbar region: Secondary | ICD-10-CM | POA: Diagnosis not present

## 2018-01-15 DIAGNOSIS — H6121 Impacted cerumen, right ear: Secondary | ICD-10-CM | POA: Diagnosis not present

## 2018-01-15 DIAGNOSIS — H9113 Presbycusis, bilateral: Secondary | ICD-10-CM | POA: Insufficient documentation

## 2018-02-18 IMAGING — CT CT ANGIO CHEST
2 of 7 series · 18 of 46 positions shown · IV contrast (isovue)
Comparison: Chest radiograph performed earlier today at [DATE] a.m.

CLINICAL DATA: Acute onset of generalized chest pain. Initial
encounter.

EXAM:
CT ANGIOGRAPHY CHEST WITH CONTRAST
TECHNIQUE: Multidetector CT imaging of the chest was performed using the
standard protocol during bolus administration of intravenous
contrast. Multiplanar CT image reconstructions and MIPs were
obtained to evaluate the vascular anatomy.
CONTRAST:  100 mL of Isovue 370 IV contrast

[Series 7: thins · axial · 0.66mm/px · z∈[+1255,+1495]mm · 15 of 386 slices shown]
[im 22/386  lung]
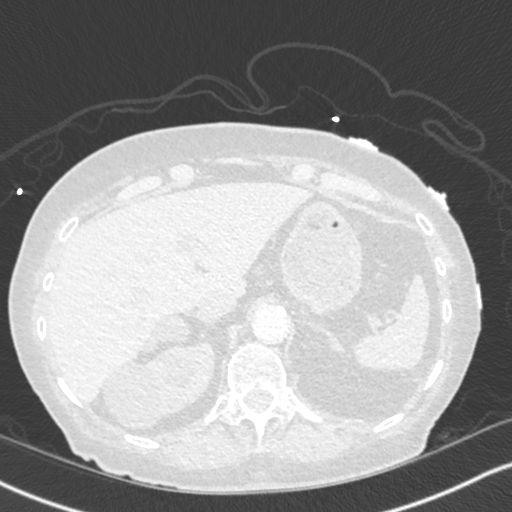
[im 43/386  soft-tissue]
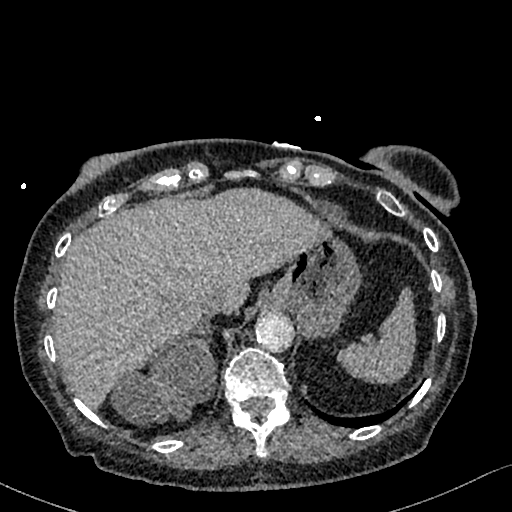
[im 65/386  lung]
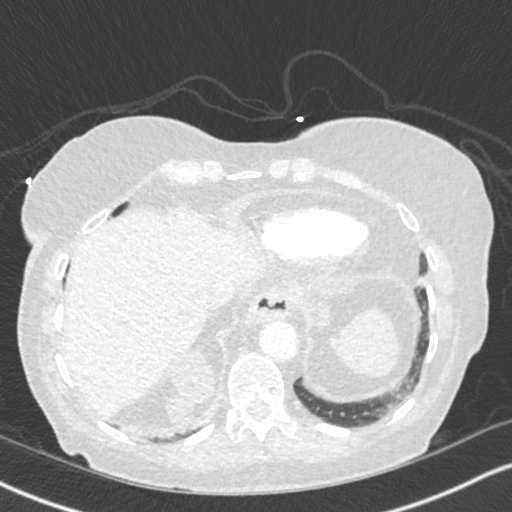
[im 86/386  soft-tissue]
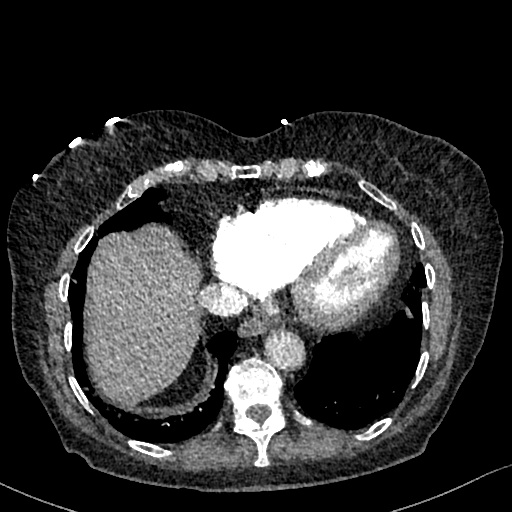
[im 129/386  lung]
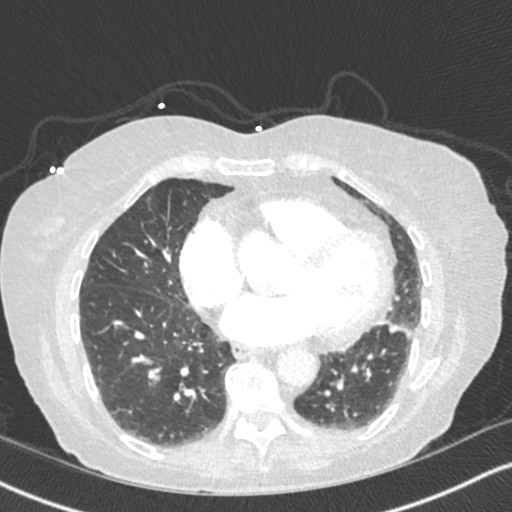
[im 150/386  soft-tissue]
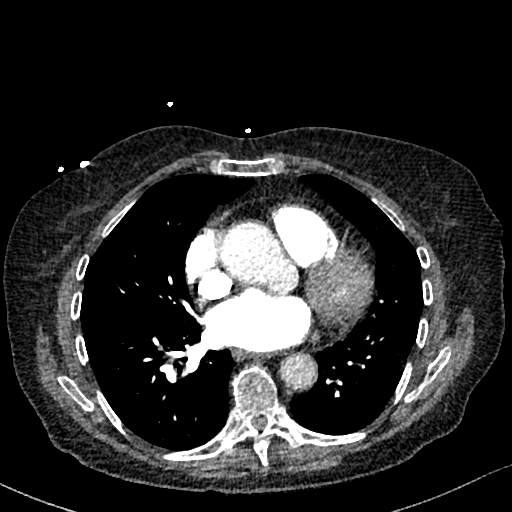
[im 172/386  lung]
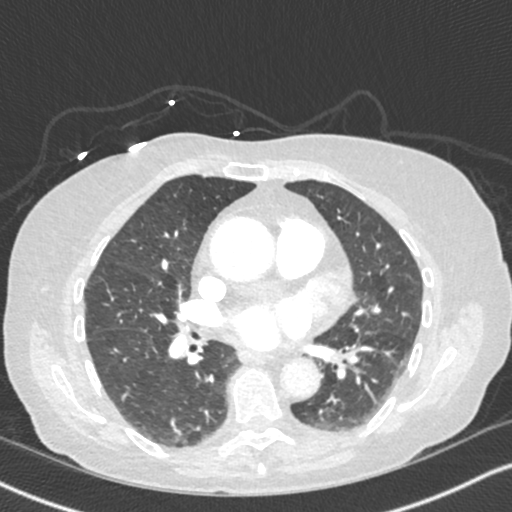
[im 193/386  soft-tissue]
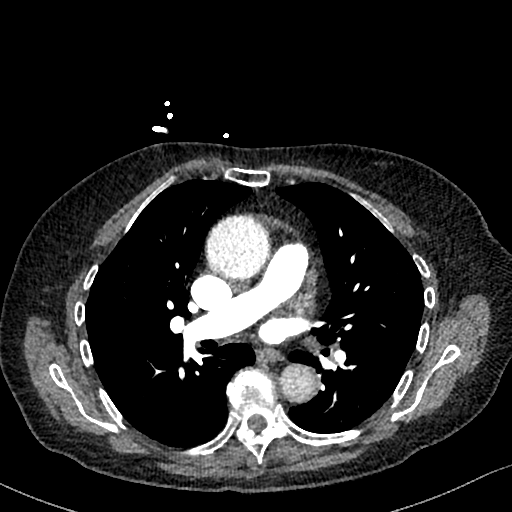
[im 214/386  lung]
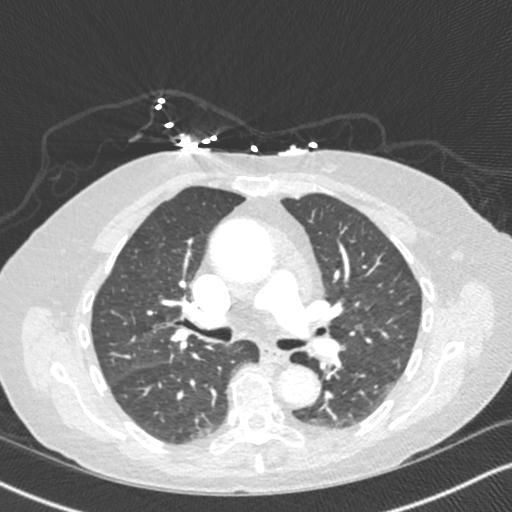
[im 236/386  soft-tissue]
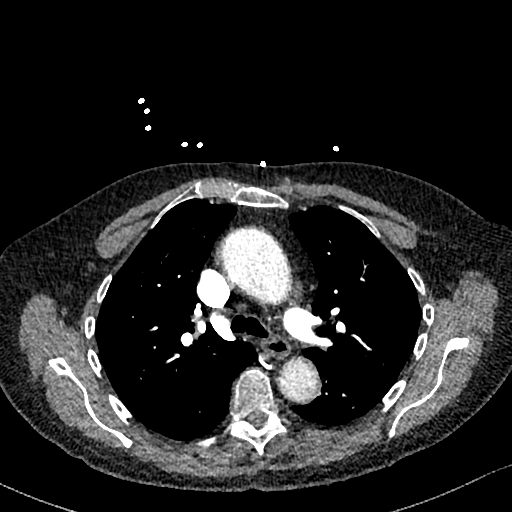
[im 257/386  lung]
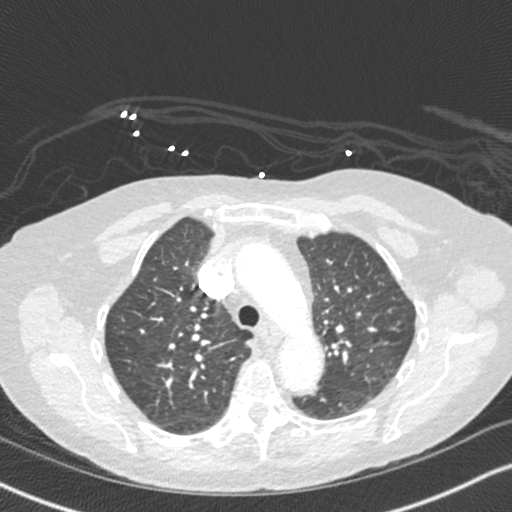
[im 300/386  soft-tissue]
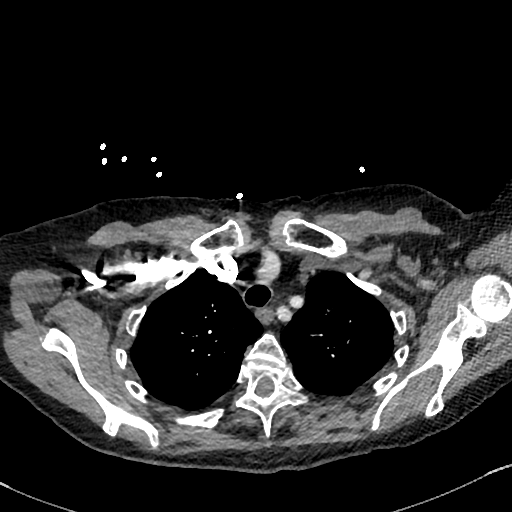
[im 321/386  lung]
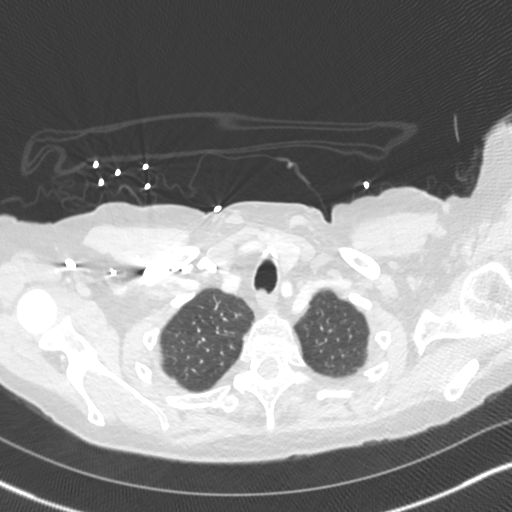
[im 343/386  soft-tissue]
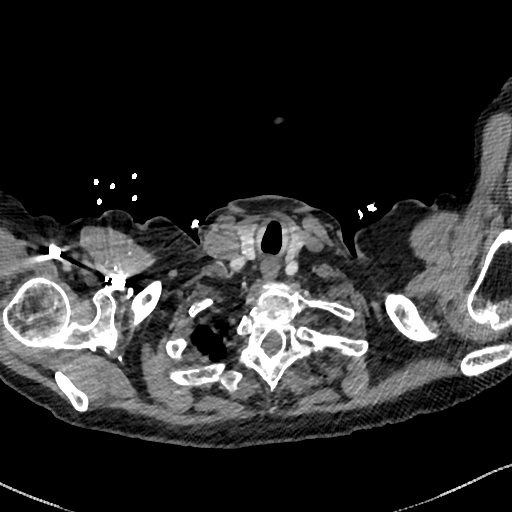
[im 364/386  lung]
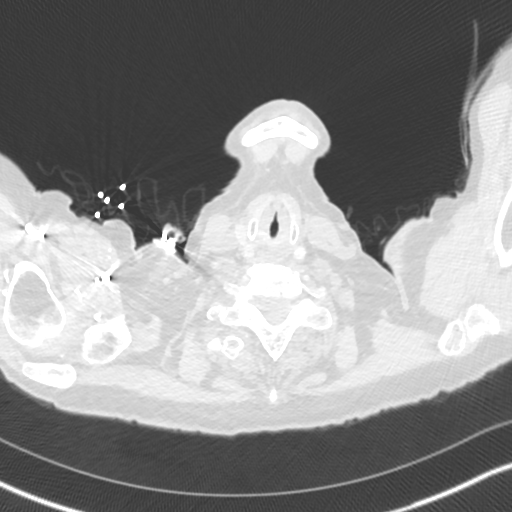

[Series 8: cor · coronal · 0.52mm/px · 3 of 118 slices shown]
[im 30/118  soft-tissue]
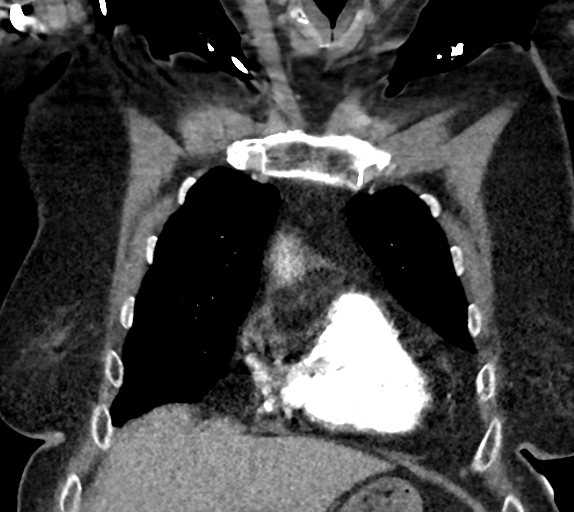
[im 59/118  soft-tissue]
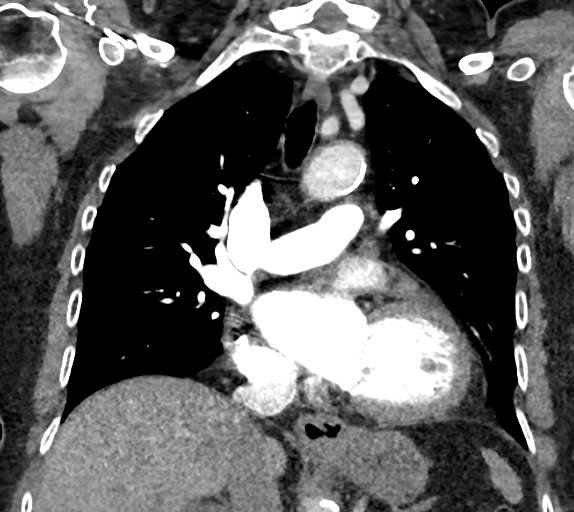
[im 88/118  soft-tissue]
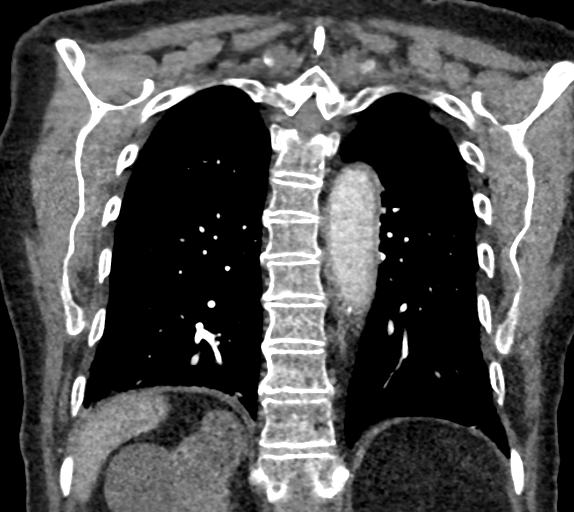

[18 of 46 positions shown; findings below may reference images not displayed]

FINDINGS: Cardiovascular:  There is no evidence of pulmonary embolus.

There is mild aneurysmal dilatation of the ascending thoracic aorta
to 4.2 cm in AP dimension. Mild calcification is noted along the
aortic arch and descending thoracic aorta. There is no evidence of
aortic dissection.

The heart is mildly enlarged. Mild calcification is noted along the
proximal great vessels.

Mediastinum/Nodes: No mediastinal lymphadenopathy is seen. No
pericardial effusion is identified. A 1.4 cm hypodensity is noted at
the right thyroid lobe. This is likely benign given its size. No
axillary lymphadenopathy is appreciated.

Lungs/Pleura: Mild bibasilar atelectasis is noted. No pleural
effusion or pneumothorax seen. No masses are identified.

Upper Abdomen: The visualized portions of the liver and spleen are
grossly unremarkable. Scattered right renal cysts are noted. A small
hiatal hernia is noted.

Musculoskeletal: No acute osseous abnormalities are identified. The
visualized musculature is unremarkable in appearance.

Review of the MIP images confirms the above findings.
IMPRESSION: 1. No evidence of pulmonary embolus.
2. Mild aneurysmal dilatation of the ascending thoracic aorta to
cm in AP dimension. No evidence of aortic dissection. Recommend
annual imaging followup by CTA or MRA. This recommendation follows
2969 ACCF/AHA/AATS/ACR/ASA/SCA/AXELSSON/TURAY/WASTON/LINDER Guidelines for the
Diagnosis and Management of Patients with Thoracic Aortic Disease.
Circulation. 2969; 121: [id number]
3. Mild cardiomegaly.
4. Small hiatal hernia noted.
5. Mild bibasilar atelectasis noted.  Lungs otherwise clear.
6. Scattered right renal cysts noted.

## 2018-02-20 DIAGNOSIS — H35372 Puckering of macula, left eye: Secondary | ICD-10-CM | POA: Diagnosis not present

## 2018-02-20 DIAGNOSIS — H25011 Cortical age-related cataract, right eye: Secondary | ICD-10-CM | POA: Diagnosis not present

## 2018-02-20 DIAGNOSIS — H2511 Age-related nuclear cataract, right eye: Secondary | ICD-10-CM | POA: Diagnosis not present

## 2018-02-20 DIAGNOSIS — H52203 Unspecified astigmatism, bilateral: Secondary | ICD-10-CM | POA: Diagnosis not present

## 2018-04-11 DIAGNOSIS — M545 Low back pain: Secondary | ICD-10-CM | POA: Diagnosis not present

## 2018-04-11 DIAGNOSIS — G894 Chronic pain syndrome: Secondary | ICD-10-CM | POA: Diagnosis not present

## 2018-04-25 ENCOUNTER — Telehealth: Payer: Self-pay | Admitting: Hematology

## 2018-04-25 ENCOUNTER — Inpatient Hospital Stay: Payer: Medicare Other | Attending: Hematology | Admitting: Hematology

## 2018-04-25 ENCOUNTER — Encounter: Payer: Self-pay | Admitting: Hematology

## 2018-04-25 ENCOUNTER — Inpatient Hospital Stay: Payer: Medicare Other

## 2018-04-25 VITALS — BP 125/77 | HR 76 | Temp 98.3°F | Resp 18 | Ht 64.0 in | Wt 152.4 lb

## 2018-04-25 DIAGNOSIS — M8580 Other specified disorders of bone density and structure, unspecified site: Secondary | ICD-10-CM

## 2018-04-25 DIAGNOSIS — Z87891 Personal history of nicotine dependence: Secondary | ICD-10-CM | POA: Diagnosis not present

## 2018-04-25 DIAGNOSIS — C9110 Chronic lymphocytic leukemia of B-cell type not having achieved remission: Secondary | ICD-10-CM | POA: Diagnosis not present

## 2018-04-25 LAB — CMP (CANCER CENTER ONLY)
ALT: 9 U/L (ref 0–44)
AST: 19 U/L (ref 15–41)
Albumin: 4.1 g/dL (ref 3.5–5.0)
Alkaline Phosphatase: 76 U/L (ref 38–126)
Anion gap: 9 (ref 5–15)
BUN: 18 mg/dL (ref 8–23)
CHLORIDE: 104 mmol/L (ref 98–111)
CO2: 26 mmol/L (ref 22–32)
CREATININE: 1.14 mg/dL — AB (ref 0.44–1.00)
Calcium: 10.3 mg/dL (ref 8.9–10.3)
GFR, Est AFR Am: 48 mL/min — ABNORMAL LOW (ref >60)
GFR, Estimated: 41 mL/min — ABNORMAL LOW (ref >60)
Glucose, Bld: 95 mg/dL (ref 70–99)
Potassium: 3.9 mmol/L (ref 3.5–5.1)
SODIUM: 139 mmol/L (ref 135–145)
Total Bilirubin: 0.4 mg/dL (ref 0.3–1.2)
Total Protein: 6.9 g/dL (ref 6.5–8.1)

## 2018-04-25 LAB — LACTATE DEHYDROGENASE: LDH: 140 U/L (ref 98–192)

## 2018-04-25 LAB — CBC WITH DIFFERENTIAL (CANCER CENTER ONLY)
BASOS PCT: 0 %
Basophils Absolute: 0 10*3/uL (ref 0.0–0.1)
EOS ABS: 0.1 10*3/uL (ref 0.0–0.5)
EOS PCT: 1 %
HCT: 33.9 % — ABNORMAL LOW (ref 34.8–46.6)
Hemoglobin: 11.3 g/dL — ABNORMAL LOW (ref 11.6–15.9)
LYMPHS ABS: 7.2 10*3/uL — AB (ref 0.9–3.3)
LYMPHS PCT: 63 %
MCH: 31.6 pg (ref 25.1–34.0)
MCHC: 33.3 g/dL (ref 31.5–36.0)
MCV: 94.7 fL (ref 79.5–101.0)
MONO ABS: 0.9 10*3/uL (ref 0.1–0.9)
Monocytes Relative: 7 %
NEUTROS ABS: 3.3 10*3/uL (ref 1.5–6.5)
Neutrophils Relative %: 29 %
PLATELETS: 316 10*3/uL (ref 145–400)
RBC: 3.58 MIL/uL — ABNORMAL LOW (ref 3.70–5.45)
RDW: 14.4 % (ref 11.2–14.5)
WBC Count: 11.5 10*3/uL — ABNORMAL HIGH (ref 3.9–10.3)

## 2018-04-25 NOTE — Telephone Encounter (Signed)
Scheduled appt per 9/12 los - gave patient AVS and calender per los.   

## 2018-04-25 NOTE — Progress Notes (Signed)
HEMATOLOGY/ONCOLOGY CLINIC NOTE  Date of Service: 04/25/2018  Patient Care Team: [name], MD as PCP - General (Internal Medicine)  CHIEF COMPLAINTS/PURPOSE OF CONSULTATION:   F/u for CLL  HISTORY OF PRESENTING ILLNESS:   Rebecca King is a wonderful 84 y.o. female who has been referred to [place] by Dr .[name], MD  for evaluation and management of lymphocytosis.  Patient has a history of osteopenia, dyslipidemia, GERD and As per PCP notes a known diagnosis of CLL diagnosed in March 2011. Patient is not very aware of this diagnosis when asked today.  She notes that she recently has had a cough and a cold from early November 2017 which she has had difficulties getting over. Notes that she had some fevers and chills at the time and was treated with azithromycin for 5 days. She also notes that she go to steroid shot.  She notes no objective fevers at this time. She is currently afebrile with a temperature of 98.33F and saturating 97% on room air. She has a history of GERD and notes some upper abdominal discomfort. She had an ultrasound of the abdomen done by her primary care physician on 08/02/2016 which showed no acute intra-abdominal pathology. No splenomegaly..   She reports some increased fatigue and weight loss from 172 down to 160 pounds. She also notes some decreased appetite. She notes that her daughter had ALL and wonders what her blood counts mean.  She notes no overt palpable new lymph nodes. Currently no acute shortness of breath or chest pain.  Labs done in our clinic today showed no anemia with a normal hemoglobin of 12.3, mild leukocytosis of 15.5k with 8.8k lymphocytes. Normal platelets.  She had a chest x-ray which showed no acute infiltrates but overinflation suggestive of COPD. procalcitonin was in normal limits and did not suggest an ongoing bacterial process. Labs also did not show any hypogammaglobinemia.  INTERVAL HISTORY  Rebecca King is here for  management and evaluation of her CLL. The patient's last visit with [place] was on 10/25/17. The pt reports that she is doing well overall.   The pt reports that she is having some mild post-herpetic neuralgias and is taking two 650mg  tylenol and one tramadol each day. She notes that she is being monitored for her cardiac aneurysm. She notes that she has come occasional sweats at night and feels cold sometimes as well.   Lab results today (04/25/18) of CBC w/diff, CMP is as follows: all values are WNL except for WBC at 11.5k, RBC at 3.58, HGB at 11.3, HCT at 33.9, Creatinine at 1.14, GFR at 41. 04/25/18 LDH is 140  On review of systems, pt reports good energy levels, post-herpetic neuralgia pain, some night sweats, and denies fevers, chills, unexpected weight loss, leg swelling, abdominal pains, and any other symptoms.   MEDICAL HISTORY:  Past Medical History:  Diagnosis Date  . Abnormal nuclear stress test 01/26/2009   POST EXERCISE PVC's NOTED SINCE 2003  . CLL (chronic lymphocytic leukemia) ([place]) 10/2009   DR. Nylen Creque  . Colon polyp    HYPERPLASTIC  . Diverticulosis   . DJD (degenerative joint disease)   . GERD (gastroesophageal reflux disease)   . Hearing loss    DR. SHOEMAKER  . Hypercholesterolemia   . Hyperlipidemia   . Iron deficiency anemia 05/2010  . Lesion of breast    LEFT, SMALL  . Microscopic hematuria    DR. PETERSON  . Neuropathy    PERIPHERAL  .  Osteopenia   . Renal cyst 10/2009   RENAL CYST ON CT 11/2009  . Renal disease    STAGE 3  . Tendon cysts    EXTENSOR 2MM RIGHT FIRST FINGER  . Vitamin D deficiency 10/2011  DJD knees Diverticulosis Hearing loss Poor vision CLL March 2011 Microscopic hematuria.  SURGICAL HISTORY: Past Surgical History:  Procedure Laterality Date  . LEFT HEART CATHETERIZATION WITH CORONARY ANGIOGRAM N/A 12/25/2011   Procedure: LEFT HEART CATHETERIZATION WITH CORONARY ANGIOGRAM;  Surgeon: Sinclair Grooms, MD;  Location: [facility] C[facility] LAB;   Service: Cardiovascular;  Laterality: N/A;    SOCIAL HISTORY: Social History   Socioeconomic History  . Marital status: Married    Spouse name: Not on file  . Number of children: Not on file  . Years of education: Not on file  . Highest education level: Not on file  Occupational History  . Occupation: UNEMPLOYED  Social Needs  . Financial resource strain: Not on file  . Food insecurity:    Worry: Not on file    Inability: Not on file  . Transportation needs:    Medical: Not on file    Non-medical: Not on file  Tobacco Use  . Smoking status: Former Smoker    Last attempt to quit: 06/25/1980    Years since quitting: 37.8  . Smokeless tobacco: Never Used  Substance and Sexual Activity  . Alcohol use: Yes  . Drug use: No  . Sexual activity: Not on file  Lifestyle  . Physical activity:    Days per week: Not on file    Minutes per session: Not on file  . Stress: Not on file  Relationships  . Social connections:    Talks on phone: Not on file    Gets together: Not on file    Attends religious service: Not on file    Active member of club or organization: Not on file    Attends meetings of clubs or organizations: Not on file    Relationship status: Not on file  . Intimate partner violence:    Fear of current or ex partner: Not on file    Emotionally abused: Not on file    Physically abused: Not on file    Forced sexual activity: Not on file  Other Topics Concern  . Not on file  Social History Narrative  . Not on file    FAMILY HISTORY: Family History  Family history unknown: Yes    ALLERGIES:  is allergic to amoxicillin; ciprofloxacin; macrodantin [nitrofurantoin]; and sulfa antibiotics.  MEDICATIONS:  Current Outpatient Medications  Medication Sig Dispense Refill  . acetaminophen (TYLENOL) 500 MG tablet Take 500 mg every 6 (six) hours as needed by mouth.    . Ascorbic Acid (VITAMIN C PO) Take 1 tablet by mouth daily.    Marland Kitchen aspirin 81 MG tablet Take 81 mg by  mouth daily.    . Cholecalciferol (VITAMIN D3) 1000 units CAPS Take 1 capsule daily by mouth.    . Coenzyme Q10 (CO Q 10 PO) Take 1 tablet by mouth daily.    . Coenzyme Q10 (CO Q-10 PO) Take 1 capsule daily by mouth.    Marland Kitchen glucosamine-chondroitin 500-400 MG tablet Take 1 tablet 3 (three) times a week by mouth.    Marland Kitchen KRILL OIL PO Take 1 capsule by mouth daily.    . Multiple Vitamin (MULITIVITAMIN WITH MINERALS) TABS Take 1 tablet by mouth daily.    Marland Kitchen NITROSTAT 0.4 MG SL tablet DISSOLVE  ONE TABLET UNDERTONGUE AS DIRECTED AS    NEEDED FOR CHEST PAIN 25 tablet 0  . polyethylene glycol (MIRALAX / GLYCOLAX) packet Take 17 g daily by mouth.    . Probiotic Product (ALIGN PO) Take 1 capsule 2 (two) times daily by mouth.    . traMADol (ULTRAM) 50 MG tablet Take 100 mg by mouth every 6 (six) hours as needed for pain.    . vitamin C (ASCORBIC ACID) 500 MG tablet Take 500 mg daily by mouth.    . magnesium hydroxide (MILK OF MAGNESIA) 400 MG/5ML suspension Take 5 mLs 4 (four) times daily as needed by mouth for mild constipation.    . metoprolol succinate (TOPROL-XL) 25 MG 24 hr tablet Take 0.5 tablets (12.5 mg total) by mouth daily. (Patient not taking: Reported on 10/25/2017) 45 tablet 3  . Omega-3 Fatty Acids (FISH OIL) 500 MG CAPS Take 1 capsule 2 (two) times daily by mouth.     No current facility-administered medications for this visit.     REVIEW OF SYSTEMS:    A 10+ POINT REVIEW OF SYSTEMS WAS OBTAINED including neurology, dermatology, psychiatry, cardiac, respiratory, lymph, extremities, GI, GU, Musculoskeletal, constitutional, breasts, reproductive, HEENT.  All pertinent positives are noted in the HPI.  All others are negative.   PHYSICAL EXAMINATION: ECOG PERFORMANCE STATUS: 2 - Symptomatic, <50% confined to bed  . Vitals:   04/25/18 1510  BP: 125/77  Pulse: 76  Resp: 18  Temp: 98.3 F (36.8 C)  SpO2: 99%   Filed Weights   04/25/18 1510  Weight: 152 lb 6.4 oz (69.1 kg)   .Body mass  index is 26.16 kg/m.  GENERAL:alert, in no acute distress and comfortable SKIN: no acute rashes, no significant lesions EYES: conjunctiva are pink and non-injected, sclera anicteric OROPHARYNX: MMM, no exudates, no oropharyngeal erythema or ulceration NECK: supple, no JVD LYMPH:  no palpable lymphadenopathy in the cervical, axillary or inguinal regions LUNGS: clear to auscultation b/l with normal respiratory effort HEART: regular rate & rhythm ABDOMEN:  normoactive bowel sounds , non tender, not distended. No palpable hepatosplenomegaly.  Extremity: no pedal edema PSYCH: alert & oriented x 3 with fluent speech NEURO: no focal motor/sensory deficits   LABORATORY DATA:  I have reviewed the data as listed  . CBC Latest Ref Rng & Units 04/25/2018 10/25/2017 05/23/2017  WBC 3.9 - 10.3 K/uL 11.5(H) 13.5(H) 12.8(H)  Hemoglobin 11.6 - 15.9 g/dL 11.3(L) 12.4 10.8(L)  Hematocrit 34.8 - 46.6 % 33.9(L) 37.3 33.7(L)  Platelets 145 - 400 K/uL 316 307 286   . CBC    Component Value Date/Time   WBC 11.5 (H) 04/25/2018 1458   WBC 12.8 (H) 05/23/2017 0026   RBC 3.58 (L) 04/25/2018 1458   HGB 11.3 (L) 04/25/2018 1458   HGB 12.2 04/26/2017 1344   HCT 33.9 (L) 04/25/2018 1458   HCT 36.3 04/26/2017 1344   PLT 316 04/25/2018 1458   PLT 296 04/26/2017 1344   MCV 94.7 04/25/2018 1458   MCV 94.3 04/26/2017 1344   MCH 31.6 04/25/2018 1458   MCHC 33.3 04/25/2018 1458   RDW 14.4 04/25/2018 1458   RDW 15.4 (H) 04/26/2017 1344   LYMPHSABS 7.2 (H) 04/25/2018 1458   LYMPHSABS 7.8 (H) 04/26/2017 1344   MONOABS 0.9 04/25/2018 1458   MONOABS 0.8 04/26/2017 1344   EOSABS 0.1 04/25/2018 1458   EOSABS 0.1 04/26/2017 1344   BASOSABS 0.0 04/25/2018 1458   BASOSABS 0.0 04/26/2017 1344   . CMP Latest Ref Rng &  Units 04/25/2018 01/03/2018 10/25/2017  Glucose 70 - 99 mg/dL 95 100(H) 90  BUN 8 - 23 mg/dL 18 18 15   Creatinine 0.44 - 1.00 mg/dL 1.14(H) 0.95 1.01  Sodium 135 - 145 mmol/L 139 136 138  Potassium  3.5 - 5.1 mmol/L 3.9 4.8 4.1  Chloride 98 - 111 mmol/L 104 98 105  CO2 22 - 32 mmol/L 26 24 27   Calcium 8.9 - 10.3 mg/dL 10.3 10.2 10.0  Total Protein 6.5 - 8.1 g/dL 6.9 - 7.1  Total Bilirubin 0.3 - 1.2 mg/dL 0.4 - 0.5  Alkaline Phos 38 - 126 U/L 76 - 82  AST 15 - 41 U/L 19 - 18  ALT 0 - 44 U/L 9 - 11   . Lab Results  Component Value Date   LDH 140 04/25/2018             RADIOGRAPHIC STUDIES: I have personally reviewed the radiological images as listed and agreed with the findings in the report. No results found.  ASSESSMENT & PLAN:   84 y.o. female with   1) Rai Stage 1 Chronic lymphocytic leukemia. Apparently was previously diagnosed in March 2011 but no data available from this. CLL Fish prognostic panel showed predominantly 13q deletion in 63% of the cells. This typically suggests a good prognosis small clone with trisomy 65 which is an intermediate prognosis.   Normal LDH levels. No palpable splenomegaly. No significant cytopenias at this time. Minimal cervical lymphadenopathy. No significant weight loss at this time.  No evidence of hypogammaglobinemia related to CLL, IgG levels tested in Jan were normal.  Plan   -Discussed that the pt should be in contact with a dermatologist given the higher incidence of non-melanoma skin cancers associated with 13Q deletions. -Recommended having both pneumonia vaccines, pt will follow up with Dr. Felipa Eth about this.  -There is no clinical or lab indication to treat her CLL at this time; we will continue close monitoring and follow up. No splenomegaly.  -Discussed using a component shingles vaccine with pt and her husband.  -Discussed pt labwork today, 04/25/18; lymphs have decreased from 8.0k to 7.2k over the last 6 months. LDH is WNL at 140.  -Will see pt back in 6 months, sooner if any new concerns   #2) Shingles- Resolved. -PCP could consider referral to Interventional pain specialist for nerve block for post-herpetic  neuralgias     RTC with Dr Irene Limbo in 6 months with labs   All of the patients questions were answered with apparent satisfaction. The patient knows to call the clinic with any problems, questions or concerns.  The total time spent in the appt was 20 minutes and more than 50% was on counseling and direct patient cares.    Sullivan Lone MD Rebecca AAHIVMS [facility] Hematology/Oncology Physician [facility]  (Office):       [phone] (Work cell):  [phone] (Fax):           [phone]  04/25/2018 3:46 PM  I, Baldwin [place], am acting as a scribe for Dr. Irene Limbo  .I have reviewed the above documentation for accuracy and completeness, and I agree with the above. Brunetta Genera MD

## 2018-04-30 DIAGNOSIS — B0229 Other postherpetic nervous system involvement: Secondary | ICD-10-CM | POA: Diagnosis not present

## 2018-04-30 DIAGNOSIS — K219 Gastro-esophageal reflux disease without esophagitis: Secondary | ICD-10-CM | POA: Diagnosis not present

## 2018-04-30 DIAGNOSIS — N183 Chronic kidney disease, stage 3 (moderate): Secondary | ICD-10-CM | POA: Diagnosis not present

## 2018-04-30 DIAGNOSIS — Z23 Encounter for immunization: Secondary | ICD-10-CM | POA: Diagnosis not present

## 2018-04-30 DIAGNOSIS — R61 Generalized hyperhidrosis: Secondary | ICD-10-CM | POA: Diagnosis not present

## 2018-04-30 DIAGNOSIS — C9110 Chronic lymphocytic leukemia of B-cell type not having achieved remission: Secondary | ICD-10-CM | POA: Diagnosis not present

## 2018-04-30 DIAGNOSIS — E7800 Pure hypercholesterolemia, unspecified: Secondary | ICD-10-CM | POA: Diagnosis not present

## 2018-05-30 ENCOUNTER — Other Ambulatory Visit: Payer: Self-pay | Admitting: Geriatric Medicine

## 2018-05-30 DIAGNOSIS — I712 Thoracic aortic aneurysm, without rupture, unspecified: Secondary | ICD-10-CM

## 2018-06-10 ENCOUNTER — Ambulatory Visit
Admission: RE | Admit: 2018-06-10 | Discharge: 2018-06-10 | Disposition: A | Discharge status: Home / Self Care | Payer: Medicare Other | Source: Ambulatory Visit | Attending: Geriatric Medicine | Admitting: Geriatric Medicine

## 2018-06-10 DIAGNOSIS — I712 Thoracic aortic aneurysm, without rupture, unspecified: Secondary | ICD-10-CM

## 2018-06-10 DIAGNOSIS — R079 Chest pain, unspecified: Secondary | ICD-10-CM | POA: Diagnosis not present

## 2018-06-10 MED ORDER — IOPAMIDOL (ISOVUE-370) INJECTION 76%
75.0000 mL | Freq: Once | INTRAVENOUS | Status: AC | PRN
  Administered 2018-06-10: 75 mL via INTRAVENOUS

## 2018-07-17 DIAGNOSIS — M1712 Unilateral primary osteoarthritis, left knee: Secondary | ICD-10-CM | POA: Diagnosis not present

## 2018-08-06 ENCOUNTER — Emergency Department (HOSPITAL_COMMUNITY): Payer: Medicare Other

## 2018-08-06 ENCOUNTER — Emergency Department (HOSPITAL_COMMUNITY)
Admission: EM | Admit: 2018-08-06 | Discharge: 2018-08-06 | Disposition: A | Discharge status: Home / Self Care | Payer: Medicare Other | Attending: Emergency Medicine | Admitting: Emergency Medicine

## 2018-08-06 DIAGNOSIS — R197 Diarrhea, unspecified: Secondary | ICD-10-CM | POA: Diagnosis not present

## 2018-08-06 DIAGNOSIS — Z79899 Other long term (current) drug therapy: Secondary | ICD-10-CM | POA: Diagnosis not present

## 2018-08-06 DIAGNOSIS — R079 Chest pain, unspecified: Secondary | ICD-10-CM | POA: Insufficient documentation

## 2018-08-06 DIAGNOSIS — R0789 Other chest pain: Secondary | ICD-10-CM | POA: Diagnosis not present

## 2018-08-06 DIAGNOSIS — Z87891 Personal history of nicotine dependence: Secondary | ICD-10-CM | POA: Diagnosis not present

## 2018-08-06 DIAGNOSIS — D72820 Lymphocytosis (symptomatic): Secondary | ICD-10-CM | POA: Diagnosis not present

## 2018-08-06 DIAGNOSIS — I712 Thoracic aortic aneurysm, without rupture, unspecified: Secondary | ICD-10-CM

## 2018-08-06 DIAGNOSIS — C9110 Chronic lymphocytic leukemia of B-cell type not having achieved remission: Secondary | ICD-10-CM | POA: Diagnosis not present

## 2018-08-06 DIAGNOSIS — R51 Headache: Secondary | ICD-10-CM | POA: Diagnosis not present

## 2018-08-06 DIAGNOSIS — I10 Essential (primary) hypertension: Secondary | ICD-10-CM | POA: Insufficient documentation

## 2018-08-06 DIAGNOSIS — R110 Nausea: Secondary | ICD-10-CM | POA: Diagnosis not present

## 2018-08-06 DIAGNOSIS — Z7982 Long term (current) use of aspirin: Secondary | ICD-10-CM | POA: Insufficient documentation

## 2018-08-06 DIAGNOSIS — R531 Weakness: Secondary | ICD-10-CM | POA: Diagnosis not present

## 2018-08-06 DIAGNOSIS — R0902 Hypoxemia: Secondary | ICD-10-CM | POA: Diagnosis not present

## 2018-08-06 LAB — I-STAT TROPONIN, ED
TROPONIN I, POC: 0 ng/mL (ref 0.00–0.08)
Troponin i, poc: 0 ng/mL (ref 0.00–0.08)

## 2018-08-06 LAB — COMPREHENSIVE METABOLIC PANEL
ALK PHOS: 69 U/L (ref 38–126)
ALT: 11 U/L (ref 0–44)
ANION GAP: 12 (ref 5–15)
AST: 19 U/L (ref 15–41)
Albumin: 3.6 g/dL (ref 3.5–5.0)
BILIRUBIN TOTAL: 0.6 mg/dL (ref 0.3–1.2)
BUN: 10 mg/dL (ref 8–23)
CHLORIDE: 105 mmol/L (ref 98–111)
CO2: 22 mmol/L (ref 22–32)
Calcium: 10 mg/dL (ref 8.9–10.3)
Creatinine, Ser: 1.03 mg/dL — ABNORMAL HIGH (ref 0.44–1.00)
GFR calc non Af Amer: 48 mL/min — ABNORMAL LOW (ref >60)
GFR, EST AFRICAN AMERICAN: 55 mL/min — AB (ref >60)
GLUCOSE: 119 mg/dL — AB (ref 70–99)
Potassium: 3.7 mmol/L (ref 3.5–5.1)
Sodium: 139 mmol/L (ref 135–145)
Total Protein: 6.2 g/dL — ABNORMAL LOW (ref 6.5–8.1)

## 2018-08-06 LAB — CBC WITH DIFFERENTIAL/PLATELET
BASOS ABS: 0 10*3/uL (ref 0.0–0.1)
Band Neutrophils: 0 %
Basophils Relative: 0 %
Blasts: 0 %
Eosinophils Absolute: 0.1 10*3/uL (ref 0.0–0.5)
Eosinophils Relative: 1 %
HCT: 35.2 % — ABNORMAL LOW (ref 36.0–46.0)
HEMOGLOBIN: 11.3 g/dL — AB (ref 12.0–15.0)
LYMPHS PCT: 60 %
Lymphs Abs: 8 10*3/uL — ABNORMAL HIGH (ref 0.7–4.0)
MCH: 30.2 pg (ref 26.0–34.0)
MCHC: 32.1 g/dL (ref 30.0–36.0)
MCV: 94.1 fL (ref 80.0–100.0)
METAMYELOCYTES PCT: 0 %
MONOS PCT: 5 %
Monocytes Absolute: 0.7 10*3/uL (ref 0.1–1.0)
Myelocytes: 0 %
NEUTROS ABS: 4.6 10*3/uL (ref 1.7–7.7)
Neutrophils Relative %: 34 %
Other: 0 %
Platelets: 303 10*3/uL (ref 150–400)
Promyelocytes Relative: 0 %
RBC: 3.74 MIL/uL — AB (ref 3.87–5.11)
RDW: 14.1 % (ref 11.5–15.5)
WBC: 13.4 10*3/uL — AB (ref 4.0–10.5)
nRBC: 0 % (ref 0.0–0.2)
nRBC: 0 /100 WBC

## 2018-08-06 LAB — URINALYSIS, ROUTINE W REFLEX MICROSCOPIC
Bilirubin Urine: NEGATIVE
Glucose, UA: NEGATIVE mg/dL
Hgb urine dipstick: NEGATIVE
Ketones, ur: NEGATIVE mg/dL
Leukocytes, UA: NEGATIVE
Nitrite: NEGATIVE
Protein, ur: NEGATIVE mg/dL
Specific Gravity, Urine: 1.005 (ref 1.005–1.030)
pH: 8 (ref 5.0–8.0)

## 2018-08-06 LAB — LIPASE, BLOOD: LIPASE: 35 U/L (ref 11–51)

## 2018-08-06 LAB — I-STAT CHEM 8, ED
BUN: 11 mg/dL (ref 8–23)
Calcium, Ion: 1.2 mmol/L (ref 1.15–1.40)
Chloride: 107 mmol/L (ref 98–111)
Creatinine, Ser: 0.8 mg/dL (ref 0.44–1.00)
Glucose, Bld: 114 mg/dL — ABNORMAL HIGH (ref 70–99)
HCT: 35 % — ABNORMAL LOW (ref 36.0–46.0)
Hemoglobin: 11.9 g/dL — ABNORMAL LOW (ref 12.0–15.0)
POTASSIUM: 3.6 mmol/L (ref 3.5–5.1)
SODIUM: 139 mmol/L (ref 135–145)
TCO2: 23 mmol/L (ref 22–32)

## 2018-08-06 MED ORDER — IOPAMIDOL (ISOVUE-370) INJECTION 76%
100.0000 mL | Freq: Once | INTRAVENOUS | Status: AC
  Administered 2018-08-06: 100 mL via INTRAVENOUS

## 2018-08-06 MED ORDER — ONDANSETRON 4 MG PO TBDP: ORAL_TABLET | ORAL | 0 refills | Status: DC

## 2018-08-06 MED ORDER — IOPAMIDOL (ISOVUE-370) INJECTION 76%: 100.0000 mL | Freq: Once | INTRAVENOUS | Status: DC | PRN

## 2018-08-06 MED ORDER — SODIUM CHLORIDE 0.9 % IV BOLUS
1000.0000 mL | Freq: Once | INTRAVENOUS | Status: AC
  Administered 2018-08-06: 1000 mL via INTRAVENOUS

## 2018-08-06 MED ORDER — ONDANSETRON HCL 4 MG/2ML IJ SOLN
4.0000 mg | Freq: Once | INTRAMUSCULAR | Status: AC
  Administered 2018-08-06: 4 mg via INTRAVENOUS
  Filled 2018-08-06: qty 2

## 2018-08-06 NOTE — ED Provider Notes (Signed)
[facility] EMERGENCY DEPARTMENT Provider Note   CSN: [id number] Arrival date & time: 08/06/18  6433     History   Chief Complaint Chief Complaint  Patient presents with  . Weakness  . Nausea    HPI Rebecca King is a 84 y.o. female history of hypertension, hyperlipidemia, review of CLL, known thoracic aneurysm with no repair here presenting with chest pain, abdominal pain, nausea, diarrhea.  Patient is from an independent living facility.  This morning, patient states that she woke up and felt lightheaded and dizzy.  She felt like she was going to pass out.  She also felt like she needed to have a bowel movement and had an episode of diarrhea.  She also felt nauseated but denies any vomiting this morning.  Patient states that she has some mild epigastric pain and chest pain as well. EMS noted that patient was hypertensive with BP 160/90.   The history is provided by the patient.    Past Medical History:  Diagnosis Date  . Abnormal nuclear stress test 01/26/2009   POST EXERCISE PVC's NOTED SINCE 2003  . CLL (chronic lymphocytic leukemia) ([place]) 10/2009   DR. KALE  . Colon polyp    HYPERPLASTIC  . Diverticulosis   . DJD (degenerative joint disease)   . GERD (gastroesophageal reflux disease)   . Hearing loss    DR. SHOEMAKER  . Hypercholesterolemia   . Hyperlipidemia   . Iron deficiency anemia 05/2010  . Lesion of breast    LEFT, SMALL  . Microscopic hematuria    DR. PETERSON  . Neuropathy    PERIPHERAL  . Osteopenia   . Renal cyst 10/2009   RENAL CYST ON CT 11/2009  . Renal disease    STAGE 3  . Tendon cysts    EXTENSOR 2MM RIGHT FIRST FINGER  . Vitamin D deficiency 10/2011    Patient Active Problem List   Diagnosis Date Noted  . CAD in native artery 07/08/2017  . Ascending aortic aneurysm ([place]) 07/08/2017  . Angina pectoris ([place]) 12/25/2011    Class: Acute  . GERD (gastroesophageal reflux disease) 12/25/2011    Past Surgical History:    Procedure Laterality Date  . LEFT HEART CATHETERIZATION WITH CORONARY ANGIOGRAM N/A 12/25/2011   Procedure: LEFT HEART CATHETERIZATION WITH CORONARY ANGIOGRAM;  Surgeon: Sinclair Grooms, MD;  Location[facility] C[facility] LAB;  Service: Cardiovascular;  Laterality: N/A;     OB History   No obstetric history on file.      Home Medications    Prior to Admission medications   Medication Sig Start Date End Date Taking? Authorizing Provider  acetaminophen (TYLENOL) 500 MG tablet Take 500 mg by mouth 2 (two) times daily.    Yes [provider]  Ascorbic Acid (VITAMIN C PO) Take 1 tablet by mouth daily.   Yes [provider]  aspirin 81 MG tablet Take 81 mg by mouth daily.   Yes [provider]  Coenzyme Q10 (CO Q 10 PO) Take 1 tablet by mouth daily.   Yes [provider]  glucosamine-chondroitin 500-400 MG tablet Take 1 tablet 3 (three) times a week by mouth.   Yes [provider]  Multiple Vitamin (MULITIVITAMIN WITH MINERALS) TABS Take 1 tablet by mouth daily.   Yes [provider]  polyethylene glycol (MIRALAX / GLYCOLAX) packet Take 17 g by mouth every other day.    Yes [provider]  Probiotic Product (ALIGN PO) Take 1 capsule by  mouth as needed.    Yes [provider]  rosuvastatin (CRESTOR) 5 MG tablet Take 5 mg by mouth at bedtime.   Yes [provider]  traMADol (ULTRAM) 50 MG tablet Take 50 mg by mouth 2 (two) times daily as needed for moderate pain.    Yes [provider]  metoprolol succinate (TOPROL-XL) 25 MG 24 hr tablet Take 0.5 tablets (12.5 mg total) by mouth daily. Patient not taking: Reported on 10/25/2017 07/09/17   Belva Crome, MD  NITROSTAT 0.4 MG SL tablet DISSOLVE ONE TABLET UNDERTONGUE AS DIRECTED AS    NEEDED FOR CHEST PAIN Patient taking differently: Place 0.4 mg under the tongue every 5 (five) minutes as needed for chest pain.  08/31/13   Belva Crome, MD    Family  History Family History  Family history unknown: Yes    Social History Social History   Tobacco Use  . Smoking status: Former Smoker    Last attempt to quit: 06/25/1980    Years since quitting: 38.1  . Smokeless tobacco: Never Used  Substance Use Topics  . Alcohol use: Yes  . Drug use: No     Allergies   Amoxicillin; Ciprofloxacin; Macrodantin [nitrofurantoin]; and Sulfa antibiotics   Review of Systems Review of Systems  Cardiovascular: Positive for chest pain.  Gastrointestinal: Positive for abdominal pain, diarrhea and nausea.  Neurological: Positive for weakness.  All other systems reviewed and are negative.    Physical Exam Updated Vital Signs BP (!) 147/80   Pulse 66   Temp 98.7 F (37.1 C) (Oral)   Resp 18   SpO2 100%   Physical Exam Vitals signs and nursing note reviewed.  Constitutional:      Appearance: Normal appearance.     Comments: Chronically ill   HENT:     Head: Normocephalic.     Nose: Nose normal.     Mouth/Throat:     Comments: MM slightly dry  Eyes:     Extraocular Movements: Extraocular movements intact.     Pupils: Pupils are equal, round, and reactive to light.  Neck:     Musculoskeletal: Normal range of motion and neck supple.  Cardiovascular:     Rate and Rhythm: Normal rate and regular rhythm.  Pulmonary:     Effort: Pulmonary effort is normal.     Breath sounds: Normal breath sounds.  Abdominal:     General: Abdomen is flat.     Palpations: Abdomen is soft.     Comments: ? Pulsatile mass, mild epigastric tenderness   Musculoskeletal: Normal range of motion.     Comments: Good pulses in all extremities   Skin:    General: Skin is warm.     Capillary Refill: Capillary refill takes less than 2 seconds.  Neurological:     General: No focal deficit present.     Mental Status: She is alert and oriented to person, place, and time.  Psychiatric:        Mood and Affect: Mood normal.        Behavior: Behavior normal.       ED Treatments / Results  Labs (all labs ordered are listed, but only abnormal results are displayed) Labs Reviewed  CBC WITH DIFFERENTIAL/PLATELET - Abnormal; Notable for the following components:      Result Value   WBC 13.4 (*)    RBC 3.74 (*)    Hemoglobin 11.3 (*)    HCT 35.2 (*)    Lymphs Abs 8.0 (*)  All other components within normal limits  COMPREHENSIVE METABOLIC PANEL - Abnormal; Notable for the following components:   Glucose, Bld 119 (*)    Creatinine, Ser 1.03 (*)    Total Protein 6.2 (*)    GFR calc non Af Amer 48 (*)    GFR calc Af Amer 55 (*)    All other components within normal limits  URINALYSIS, ROUTINE W REFLEX MICROSCOPIC - Abnormal; Notable for the following components:   Color, Urine STRAW (*)    All other components within normal limits  I-STAT CHEM 8, ED - Abnormal; Notable for the following components:   Glucose, Bld 114 (*)    Hemoglobin 11.9 (*)    HCT 35.0 (*)    All other components within normal limits  LIPASE, BLOOD  I-STAT TROPONIN, ED  I-STAT TROPONIN, ED    EKG EKG Interpretation  Date/Time:  Tuesday August 06 2018 08:32:42 EST Ventricular Rate:  58 PR Interval:    QRS Duration: 91 QT Interval:  439 QTC Calculation: 432 R Axis:   8 Text Interpretation:  Sinus rhythm Prolonged PR interval Low voltage, extremity leads No significant change since last tracing Confirmed by Wandra Arthurs [phone]) on 08/06/2018 8:41:36 AM   Radiology Ct Head Wo Contrast  Result Date: 08/06/2018 CLINICAL DATA:  Intermittent headaches EXAM: CT HEAD WITHOUT CONTRAST TECHNIQUE: Contiguous axial images were obtained from the base of the skull through the vertex without intravenous contrast. COMPARISON:  None. FINDINGS: Brain: No evidence of acute infarction, hemorrhage, hydrocephalus, extra-axial collection or mass lesion/mass effect. Age related atrophy. Vascular: Intracranial atherosclerosis Skull: Normal. Negative for fracture or focal lesion.  Sinuses/Orbits: Partial opacification of the bilateral ethmoid sinuses. Visualized paranasal sinuses and mastoid air cells are otherwise clear. Other: None. IMPRESSION: No evidence of acute intracranial abnormality. Age related atrophy. Electronically Signed   By: Julian Hy M.D.   On: 08/06/2018 10:11   Dg Chest Port 1 View  Result Date: 08/06/2018 CLINICAL DATA:  Chest pain and dizziness EXAM: PORTABLE CHEST 1 VIEW COMPARISON:  Chest radiograph May 23, 2017 and chest CT June 10, 2018 FINDINGS: No edema or consolidation. Heart is mildly enlarged with pulmonary vascularity normal. There is aortic atherosclerosis. No adenopathy. No pneumothorax. No bone lesions. IMPRESSION: No edema or consolidation. Stable cardiac prominence. There is aortic atherosclerosis. Aortic Atherosclerosis (ICD10-I70.0). Electronically Signed   By: Lowella Grip III M.D.   On: 08/06/2018 09:02   Ct Angio Chest/abd/pel For Dissection W And/or Wo Contrast  Result Date: 08/06/2018 CLINICAL DATA:  Left upper chest pain. Known aneurysmal disease of the ascending thoracic aorta. EXAM: CT ANGIOGRAPHY CHEST, ABDOMEN AND PELVIS TECHNIQUE: Multidetector CT imaging through the chest, abdomen and pelvis was performed using the standard protocol during bolus administration of intravenous contrast. Multiplanar reconstructed images and MIPs were obtained and reviewed to evaluate the vascular anatomy. CONTRAST:  100 mL Isovue 370 IV COMPARISON:  Multiple prior CTA chest exams with the latest dated 06/10/2018. CT of the abdomen and pelvis on 01/26/2017 FINDINGS: CTA CHEST FINDINGS Cardiovascular: Stable normal aortic root diameter of approximately 3.7 cm. Stable mild aneurysmal dilatation of the ascending thoracic aorta measuring approximately 4.2 cm in greatest diameter. The aortic arch measures 2.8-3.1 cm. The descending thoracic aorta measures 2.4 cm. No evidence of aortic dissection or rupture. Stable mild calcified plaque at  the level of the aortic arch. Proximal great vessels show normal patency and bovine branching anatomy. The heart size is stable and mildly enlarged. No significant calcified coronary  artery plaque. No pericardial fluid identified. Central pulmonary arteries are normal in caliber. Pulmonary arteries are well visualized and opacification adequate to exclude significant pulmonary embolism. Mediastinum/Nodes: No enlarged mediastinal, hilar, or axillary lymph nodes. Thyroid gland, trachea, and esophagus demonstrate no significant findings. Lungs/Pleura: Stable pulmonary scarring and subpleural nodularity in the inferior right middle lobe. There is no evidence of pulmonary edema, consolidation, pneumothorax or pleural fluid. Musculoskeletal: Stable osteopenia. Review of the MIP images confirms the above findings. CTA ABDOMEN AND PELVIS FINDINGS VASCULAR Aorta: The abdominal aorta shows atherosclerosis without evidence of aneurysmal disease or dissection. Celiac: Mild calcified plaque at origin without significant stenosis. SMA: Calcified and noncalcified plaque causes approximately 40% proximal stenosis. Renals: Bilateral single renal arteries demonstrate no significant stenosis. IMA: Normally patent. Inflow: Bilateral iliac arteries demonstrate mild atherosclerosis without evidence of aneurysmal disease or significant stenosis. 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 This was seen on prior CT of the abdomen. Review of the MIP images confirms the above findings. NON-VASCULAR Hepatobiliary: Stable dilatation of the common bile duct post cholecystectomy, measuring up to 16 mm. The liver appears unremarkable. Pancreas: Unremarkable. No pancreatic ductal dilatation or surrounding inflammatory changes. Spleen: Normal in size without focal abnormality. Adrenals/Urinary Tract: Renal cysts again noted bilaterally. No hydronephrosis, solid renal mass or calculi  identified. The bladder is moderately distended. Stomach/Bowel: Bowel shows no evidence of obstruction, ileus, inflammation or lesion. No free air identified. Lymphatic: No enlarged lymph nodes identified. Reproductive: Uterus and bilateral adnexa are unremarkable. Other: Small left inguinal hernia contains fat. No ascites or abscess. Musculoskeletal: Stable osteopenia and mild loss of height of the L2 vertebral body. Degenerative disc disease throughout the lumbar spine. Review of the MIP images confirms the above findings. IMPRESSION: 1. Stable mild aneurysmal disease of the ascending thoracic aorta measuring 4.2 cm. No evidence of acute aortic dissection or rupture. 2. Stable cardiac enlargement. 3. Aortic atherosclerosis and mild proximal SMA stenosis. No evidence of abdominal aortic aneurysm. 4. Stable dilatation of the common bile duct following cholecystectomy. 5. Stable mild loss of height of the L2 vertebral body, likely reflecting osteopenic compression. 6. 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 Electronically Signed   By: Aletta Edouard M.D.   On: 08/06/2018 11:04    Procedures Procedures (including critical care time)  Medications Ordered in ED Medications  iopamidol (ISOVUE-370) 76 % injection 100 mL (has no administration in time range)  sodium chloride 0.9 % bolus 1,000 mL (0 mLs Intravenous Stopped 08/06/18 0947)  ondansetron (ZOFRAN) injection 4 mg (4 mg Intravenous Given 08/06/18 0852)  iopamidol (ISOVUE-370) 76 % injection 100 mL (100 mLs Intravenous Contrast Given 08/06/18 1012)     Initial Impression / Assessment and Plan / ED Course  I have reviewed the triage vital signs and the nursing notes.  Pertinent labs & imaging results that were available during my care of the patient were reviewed by me and considered in my medical decision making (see chart for details).    AERIANNA LOSEY is a 84 y.o. female here with nausea, chest pain,  diarrhea. She has known thoracic aneurysms and she is hypertensive so consider dissection. But likely viral gastro vs pneumonia vs viral syndrome. Will get labs, CT dissection, UA. Will hydrate and reassess.   11:57 AM Labs showed WBC 13. But CXR and UA clear. CTA showed stable thoracic aneurysm with no dissection or rupture. No vomiting in the ED. I think  likely viral gastro. Will dc home with zofran prn.   Final Clinical Impressions(s) / ED Diagnoses   Final diagnoses:  None    ED Discharge Orders    None       Drenda Freeze, MD 08/06/18 1158

## 2018-08-06 NOTE — ED Triage Notes (Signed)
To ED via [facility] from independent living at [facility] - states that she felt dizzy and slightly nauseated lst night before she went to bed last night- woke up at 4am feeling worse.  No vomiting/ states has had diarrhea, but is unable to tell staff when.

## 2018-08-06 NOTE — ED Notes (Signed)
Husband bedside

## 2018-08-06 NOTE — ED Notes (Signed)
Patient verbalizes understanding of discharge instructions. Opportunity for questioning and answers were provided. Armband removed by staff, pt discharged from ED ambulatory w/ husband  

## 2018-08-06 NOTE — Discharge Instructions (Addendum)
Stay hydrated.   Take zofran as needed for nausea.   Continue your other meds   See your doctor  Your aneurysm is stable.   Return to ER if you have worse chest pain, abdominal pain, vomiting, dehydration.

## 2018-08-06 NOTE — ED Notes (Signed)
Ambulated pt to restroom with difficulties.

## 2018-08-08 LAB — PATHOLOGIST SMEAR REVIEW: Path Review: REACTIVE

## 2018-09-03 DIAGNOSIS — H6121 Impacted cerumen, right ear: Secondary | ICD-10-CM | POA: Diagnosis not present

## 2018-09-23 DIAGNOSIS — I700 Atherosclerosis of aorta: Secondary | ICD-10-CM | POA: Diagnosis not present

## 2018-09-23 DIAGNOSIS — C9110 Chronic lymphocytic leukemia of B-cell type not having achieved remission: Secondary | ICD-10-CM | POA: Diagnosis not present

## 2018-09-23 DIAGNOSIS — B0229 Other postherpetic nervous system involvement: Secondary | ICD-10-CM | POA: Diagnosis not present

## 2018-09-23 DIAGNOSIS — K219 Gastro-esophageal reflux disease without esophagitis: Secondary | ICD-10-CM | POA: Diagnosis not present

## 2018-09-23 DIAGNOSIS — E7800 Pure hypercholesterolemia, unspecified: Secondary | ICD-10-CM | POA: Diagnosis not present

## 2018-09-23 DIAGNOSIS — I712 Thoracic aortic aneurysm, without rupture: Secondary | ICD-10-CM | POA: Diagnosis not present

## 2018-09-23 DIAGNOSIS — N183 Chronic kidney disease, stage 3 (moderate): Secondary | ICD-10-CM | POA: Diagnosis not present

## 2018-10-23 NOTE — Progress Notes (Signed)
HEMATOLOGY/ONCOLOGY CLINIC NOTE  Date of Service: 10/24/2018  Patient Care Team: [name], MD as PCP - General (Internal Medicine)  CHIEF COMPLAINTS/PURPOSE OF CONSULTATION:   F/u for CLL  HISTORY OF PRESENTING ILLNESS:   Rebecca King is a wonderful 85 y.o. female who has been referred to [place] by Dr .[name], MD  for evaluation and management of lymphocytosis.  Patient has a history of osteopenia, dyslipidemia, GERD and As per PCP notes a known diagnosis of CLL diagnosed in March 2011. Patient is not very aware of this diagnosis when asked today.  She notes that she recently has had a cough and a cold from early November 2017 which she has had difficulties getting over. Notes that she had some fevers and chills at the time and was treated with azithromycin for 5 days. She also notes that she go to steroid shot.  She notes no objective fevers at this time. She is currently afebrile with a temperature of 98.50F and saturating 97% on room air. She has a history of GERD and notes some upper abdominal discomfort. She had an ultrasound of the abdomen done by her primary care physician on 08/02/2016 which showed no acute intra-abdominal pathology. No splenomegaly..   She reports some increased fatigue and weight loss from 172 down to 160 pounds. She also notes some decreased appetite. She notes that her daughter had ALL and wonders what her blood counts mean.  She notes no overt palpable new lymph nodes. Currently no acute shortness of breath or chest pain.  Labs done in our clinic today showed no anemia with a normal hemoglobin of 12.3, mild leukocytosis of 15.5k with 8.8k lymphocytes. Normal platelets.  She had a chest x-ray which showed no acute infiltrates but overinflation suggestive of COPD. procalcitonin was in normal limits and did not suggest an ongoing bacterial process. Labs also did not show any hypogammaglobinemia.  INTERVAL HISTORY  Rebecca King is here for  management and evaluation of her CLL. The patient's last visit with [place] was on 04/25/18. The pt reports that she is doing well overall.   The pt reports that she feels like she is having hot flashes which happen mostly at night time, but sometimes in the day as well. The pt notes that she sweats during these moments. She is also having neuropathy in her feet and endorses joint pains. The pt is taking a multivitamin. The pt notes that she feels she is not as fast at her daily activities as she used to be, but is overall quite active and enjoying a good quality of life.   The pt had shingles in the interim as well, and continues to have occasional neuralgias. She is taking 50mg  Tramadol once a day and Tylenol as needed.   Lab results today (10/24/18) of CBC w/diff and CMP is as follows: all values are WNL except for WBC at 14.3k, RBC at 3.68, HGB at 11.3, HCT at 34.0, Lymphs abs at 9.1k, GFR at 51. 10/24/18 LDH is at 186  On review of systems, pt reports some hot flashes, neuralgias, good energy levels, and denies drenching night sweats, fevers, chills, abdominal pains, noticing any new lumps or bumps, leg swelling, lower abdominal pains, and any other symptoms.   MEDICAL HISTORY:  Past Medical History:  Diagnosis Date  . Abnormal nuclear stress test 01/26/2009   POST EXERCISE PVC's NOTED SINCE 2003  . CLL (chronic lymphocytic leukemia) ([place]) 10/2009   DR.   . Colon  polyp    HYPERPLASTIC  . Diverticulosis   . DJD (degenerative joint disease)   . GERD (gastroesophageal reflux disease)   . Hearing loss    DR. SHOEMAKER  . Hypercholesterolemia   . Hyperlipidemia   . Iron deficiency anemia 05/2010  . Lesion of breast    LEFT, SMALL  . Microscopic hematuria    DR. PETERSON  . Neuropathy    PERIPHERAL  . Osteopenia   . Renal cyst 10/2009   RENAL CYST ON CT 11/2009  . Renal disease    STAGE 3  . Tendon cysts    EXTENSOR 2MM RIGHT FIRST FINGER  . Vitamin D deficiency 10/2011  DJD  knees Diverticulosis Hearing loss Poor vision CLL March 2011 Microscopic hematuria.  SURGICAL HISTORY: Past Surgical History:  Procedure Laterality Date  . LEFT HEART CATHETERIZATION WITH CORONARY ANGIOGRAM N/A 12/25/2011   Procedure: LEFT HEART CATHETERIZATION WITH CORONARY ANGIOGRAM;  Surgeon: Sinclair Grooms, MD;  Location: [facility] CATH LAB;  Service: Cardiovascular;  Laterality: N/A;    SOCIAL HISTORY: Social History   Socioeconomic History  . Marital status: Married    Spouse name: Not on file  . Number of children: Not on file  . Years of education: Not on file  . Highest education level: Not on file  Occupational History  . Occupation: UNEMPLOYED  Social Needs  . Financial resource strain: Not on file  . Food insecurity:    Worry: Not on file    Inability: Not on file  . Transportation needs:    Medical: Not on file    Non-medical: Not on file  Tobacco Use  . Smoking status: Former Smoker    Last attempt to quit: 06/25/1980    Years since quitting: 38.3  . Smokeless tobacco: Never Used  Substance and Sexual Activity  . Alcohol use: Yes  . Drug use: No  . Sexual activity: Not on file  Lifestyle  . Physical activity:    Days per week: Not on file    Minutes per session: Not on file  . Stress: Not on file  Relationships  . Social connections:    Talks on phone: Not on file    Gets together: Not on file    Attends religious service: Not on file    Active member of club or organization: Not on file    Attends meetings of clubs or organizations: Not on file    Relationship status: Not on file  . Intimate partner violence:    Fear of current or ex partner: Not on file    Emotionally abused: Not on file    Physically abused: Not on file    Forced sexual activity: Not on file  Other Topics Concern  . Not on file  Social History Narrative  . Not on file    FAMILY HISTORY: Family History  Family history unknown: Yes    ALLERGIES:  is allergic to  amoxicillin; ciprofloxacin; macrodantin [nitrofurantoin]; and sulfa antibiotics.  MEDICATIONS:  Current Outpatient Medications  Medication Sig Dispense Refill  . acetaminophen (TYLENOL) 500 MG tablet Take 500 mg by mouth 2 (two) times daily.     . Ascorbic Acid (VITAMIN C PO) Take 1 tablet by mouth daily.    Marland Kitchen aspirin 81 MG tablet Take 81 mg by mouth daily.    . Coenzyme Q10 (CO Q 10 PO) Take 1 tablet by mouth daily.    Marland Kitchen glucosamine-chondroitin 500-400 MG tablet Take 1 tablet 3 (three) times a  week by mouth.    . metoprolol succinate (TOPROL-XL) 25 MG 24 hr tablet Take 0.5 tablets (12.5 mg total) by mouth daily. (Patient not taking: Reported on 10/25/2017) 45 tablet 3  . Multiple Vitamin (MULITIVITAMIN WITH MINERALS) TABS Take 1 tablet by mouth daily.    Marland Kitchen NITROSTAT 0.4 MG SL tablet DISSOLVE ONE TABLET UNDERTONGUE AS DIRECTED AS    NEEDED FOR CHEST PAIN (Patient taking differently: Place 0.4 mg under the tongue every 5 (five) minutes as needed for chest pain. ) 25 tablet 0  . ondansetron (ZOFRAN ODT) 4 MG disintegrating tablet 4mg  ODT q4 hours prn nausea/vomit 8 tablet 0  . polyethylene glycol (MIRALAX / GLYCOLAX) packet Take 17 g by mouth every other day.     . Probiotic Product (ALIGN PO) Take 1 capsule by mouth as needed.     . rosuvastatin (CRESTOR) 5 MG tablet Take 5 mg by mouth at bedtime.    . traMADol (ULTRAM) 50 MG tablet Take 50 mg by mouth 2 (two) times daily as needed for moderate pain.      No current facility-administered medications for this visit.     REVIEW OF SYSTEMS:    A 10+ POINT REVIEW OF SYSTEMS WAS OBTAINED including neurology, dermatology, psychiatry, cardiac, respiratory, lymph, extremities, GI, GU, Musculoskeletal, constitutional, breasts, reproductive, HEENT.  All pertinent positives are noted in the HPI.  All others are negative.   PHYSICAL EXAMINATION: ECOG PERFORMANCE STATUS: 2 - Symptomatic, <50% confined to bed  . Vitals:   10/24/18 1516  BP: 127/76   Pulse: 69  Resp: 17  Temp: 98.7 F (37.1 C)  SpO2: 98%   Filed Weights   10/24/18 1516  Weight: 152 lb 14.4 oz (69.4 kg)   .Body mass index is 26.25 kg/m.  GENERAL:alert, in no acute distress and comfortable SKIN: no acute rashes, no significant lesions EYES: conjunctiva are pink and non-injected, sclera anicteric OROPHARYNX: MMM, no exudates, no oropharyngeal erythema or ulceration NECK: supple, no JVD LYMPH:  no palpable lymphadenopathy in the cervical, axillary or inguinal regions LUNGS: clear to auscultation b/l with normal respiratory effort HEART: regular rate & rhythm ABDOMEN:  normoactive bowel sounds , non tender, not distended. No palpable hepatosplenomegaly.  Extremity: no pedal edema PSYCH: alert & oriented x 3 with fluent speech NEURO: no focal motor/sensory deficits   LABORATORY DATA:  I have reviewed the data as listed  . CBC Latest Ref Rng & Units 10/24/2018 08/06/2018 08/06/2018  WBC 4.0 - 10.5 K/uL 14.3(H) - 13.4(H)  Hemoglobin 12.0 - 15.0 g/dL 11.3(L) 11.9(L) 11.3(L)  Hematocrit 36.0 - 46.0 % 34.0(L) 35.0(L) 35.2(L)  Platelets 150 - 400 K/uL 298 - 303   . CBC    Component Value Date/Time   WBC 14.3 (H) 10/24/2018 1448   RBC 3.68 (L) 10/24/2018 1448   HGB 11.3 (L) 10/24/2018 1448   HGB 11.3 (L) 04/25/2018 1458   HGB 12.2 04/26/2017 1344   HCT 34.0 (L) 10/24/2018 1448   HCT 36.3 04/26/2017 1344   PLT 298 10/24/2018 1448   PLT 316 04/25/2018 1458   PLT 296 04/26/2017 1344   MCV 92.4 10/24/2018 1448   MCV 94.3 04/26/2017 1344   MCH 30.7 10/24/2018 1448   MCHC 33.2 10/24/2018 1448   RDW 14.9 10/24/2018 1448   RDW 15.4 (H) 04/26/2017 1344   LYMPHSABS 9.1 (H) 10/24/2018 1448   LYMPHSABS 7.8 (H) 04/26/2017 1344   MONOABS 1.0 10/24/2018 1448   MONOABS 0.8 04/26/2017 1344   EOSABS 0.1  10/24/2018 1448   EOSABS 0.1 04/26/2017 1344   BASOSABS 0.1 10/24/2018 1448   BASOSABS 0.0 04/26/2017 1344   . CMP Latest Ref Rng & Units 10/24/2018 08/06/2018  08/06/2018  Glucose 70 - 99 mg/dL 93 114(H) 119(H)  BUN 8 - 23 mg/dL 13 11 10   Creatinine 0.44 - 1.00 mg/dL 0.98 0.80 1.03(H)  Sodium 135 - 145 mmol/L 139 139 139  Potassium 3.5 - 5.1 mmol/L 4.7 3.6 3.7  Chloride 98 - 111 mmol/L 105 107 105  CO2 22 - 32 mmol/L 26 - 22  Calcium 8.9 - 10.3 mg/dL 10.0 - 10.0  Total Protein 6.5 - 8.1 g/dL 6.7 - 6.2(L)  Total Bilirubin 0.3 - 1.2 mg/dL 0.6 - 0.6  Alkaline Phos 38 - 126 U/L 66 - 69  AST 15 - 41 U/L 23 - 19  ALT 0 - 44 U/L 13 - 11   . Lab Results  Component Value Date   LDH 186 10/24/2018             RADIOGRAPHIC STUDIES: I have personally reviewed the radiological images as listed and agreed with the findings in the report. No results found.  ASSESSMENT & PLAN:   84 y.o. female with   1) Rai Stage 1 Chronic lymphocytic leukemia. Apparently was previously diagnosed in March 2011 but no data available from this. CLL Fish prognostic panel showed predominantly 13q deletion in 63% of the cells. This typically suggests a good prognosis small clone with trisomy 49 which is an intermediate prognosis.   Normal LDH levels. No palpable splenomegaly. No significant cytopenias at this time. Minimal cervical lymphadenopathy. No significant weight loss at this time.  No evidence of hypogammaglobinemia related to CLL, IgG levels tested in Jan were normal.  PLAN: -Discussed pt labwork today, 10/24/18; blood counts are stable, Lymphs abs mildly increased to 9.1k over the last 6 months -Recommend Vitamin D replacement closer to 60 with her pcp -The pt shows no over clinical or lab progression of her CLL at this time.  -No indication for initiating treatment at this time -Discussed that the pt should be in contact with a dermatologist given the higher incidence of non-melanoma skin cancers associated with 13Q deletions. -Recommended having both pneumonia vaccines, pt will follow up with Dr. Felipa Eth about this.  -Discussed using a component  shingles vaccine with pt and her husband.  -Will see the pt back in 6 months  #2) Shingles- Resolved. -PCP could consider referral to Interventional pain specialist for nerve block for post-herpetic neuralgias     RTC with Dr Irene Limbo with labs in 6 months   All of the patients questions were answered with apparent satisfaction. The patient knows to call the clinic with any problems, questions or concerns.  The total time spent in the appt was 20 minutes and more than 50% was on counseling and direct patient cares.    Sullivan Lone MD Rebecca AAHIVMS [facility] Hematology/Oncology Physician [facility]  (Office):       [phone] (Work cell):  [phone] (Fax):           [phone]  10/24/2018 4:23 PM  I, Baldwin [place], am acting as a scribe for Dr. Sullivan Lone.   .I have reviewed the above documentation for accuracy and completeness, and I agree with the above. Brunetta Genera MD

## 2018-10-24 ENCOUNTER — Other Ambulatory Visit: Payer: Self-pay

## 2018-10-24 ENCOUNTER — Inpatient Hospital Stay: Payer: Medicare Other

## 2018-10-24 ENCOUNTER — Inpatient Hospital Stay: Payer: Medicare Other | Attending: Hematology | Admitting: Hematology

## 2018-10-24 ENCOUNTER — Telehealth: Payer: Self-pay | Admitting: Hematology

## 2018-10-24 VITALS — BP 127/76 | HR 69 | Temp 98.7°F | Resp 17 | Wt 152.9 lb

## 2018-10-24 DIAGNOSIS — C9110 Chronic lymphocytic leukemia of B-cell type not having achieved remission: Secondary | ICD-10-CM

## 2018-10-24 DIAGNOSIS — M792 Neuralgia and neuritis, unspecified: Secondary | ICD-10-CM | POA: Insufficient documentation

## 2018-10-24 DIAGNOSIS — G629 Polyneuropathy, unspecified: Secondary | ICD-10-CM | POA: Insufficient documentation

## 2018-10-24 DIAGNOSIS — Z87891 Personal history of nicotine dependence: Secondary | ICD-10-CM | POA: Insufficient documentation

## 2018-10-24 LAB — CMP (CANCER CENTER ONLY)
ALT: 13 U/L (ref 0–44)
AST: 23 U/L (ref 15–41)
Albumin: 3.9 g/dL (ref 3.5–5.0)
Alkaline Phosphatase: 66 U/L (ref 38–126)
Anion gap: 8 (ref 5–15)
BUN: 13 mg/dL (ref 8–23)
CO2: 26 mmol/L (ref 22–32)
Calcium: 10 mg/dL (ref 8.9–10.3)
Chloride: 105 mmol/L (ref 98–111)
Creatinine: 0.98 mg/dL (ref 0.44–1.00)
GFR, Est AFR Am: 59 mL/min — ABNORMAL LOW (ref >60)
GFR, Estimated: 51 mL/min — ABNORMAL LOW (ref >60)
GLUCOSE: 93 mg/dL (ref 70–99)
Potassium: 4.7 mmol/L (ref 3.5–5.1)
Sodium: 139 mmol/L (ref 135–145)
Total Bilirubin: 0.6 mg/dL (ref 0.3–1.2)
Total Protein: 6.7 g/dL (ref 6.5–8.1)

## 2018-10-24 LAB — CBC WITH DIFFERENTIAL/PLATELET
Abs Immature Granulocytes: 0.02 10*3/uL (ref 0.00–0.07)
Basophils Absolute: 0.1 10*3/uL (ref 0.0–0.1)
Basophils Relative: 1 %
Eosinophils Absolute: 0.1 10*3/uL (ref 0.0–0.5)
Eosinophils Relative: 1 %
HCT: 34 % — ABNORMAL LOW (ref 36.0–46.0)
Hemoglobin: 11.3 g/dL — ABNORMAL LOW (ref 12.0–15.0)
Immature Granulocytes: 0 %
Lymphocytes Relative: 63 %
Lymphs Abs: 9.1 10*3/uL — ABNORMAL HIGH (ref 0.7–4.0)
MCH: 30.7 pg (ref 26.0–34.0)
MCHC: 33.2 g/dL (ref 30.0–36.0)
MCV: 92.4 fL (ref 80.0–100.0)
MONO ABS: 1 10*3/uL (ref 0.1–1.0)
MONOS PCT: 7 %
Neutro Abs: 4 10*3/uL (ref 1.7–7.7)
Neutrophils Relative %: 28 %
Platelets: 298 10*3/uL (ref 150–400)
RBC: 3.68 MIL/uL — AB (ref 3.87–5.11)
RDW: 14.9 % (ref 11.5–15.5)
WBC: 14.3 10*3/uL — ABNORMAL HIGH (ref 4.0–10.5)
nRBC: 0 % (ref 0.0–0.2)

## 2018-10-24 LAB — LACTATE DEHYDROGENASE: LDH: 186 U/L (ref 98–192)

## 2018-10-24 NOTE — Telephone Encounter (Signed)
Scheduled appt per 3/12 los. ° °Printed calendar and avs. ° °

## 2018-10-31 DIAGNOSIS — L03317 Cellulitis of buttock: Secondary | ICD-10-CM | POA: Diagnosis not present

## 2018-12-05 DIAGNOSIS — M1712 Unilateral primary osteoarthritis, left knee: Secondary | ICD-10-CM | POA: Diagnosis not present

## 2019-01-23 ENCOUNTER — Other Ambulatory Visit: Payer: Self-pay | Admitting: Geriatric Medicine

## 2019-01-23 DIAGNOSIS — H52203 Unspecified astigmatism, bilateral: Secondary | ICD-10-CM | POA: Diagnosis not present

## 2019-01-23 DIAGNOSIS — H25011 Cortical age-related cataract, right eye: Secondary | ICD-10-CM | POA: Diagnosis not present

## 2019-01-23 DIAGNOSIS — I712 Thoracic aortic aneurysm, without rupture, unspecified: Secondary | ICD-10-CM

## 2019-01-23 DIAGNOSIS — H2511 Age-related nuclear cataract, right eye: Secondary | ICD-10-CM | POA: Diagnosis not present

## 2019-01-23 DIAGNOSIS — H35373 Puckering of macula, bilateral: Secondary | ICD-10-CM | POA: Diagnosis not present

## 2019-02-06 ENCOUNTER — Encounter: Payer: Self-pay | Admitting: Podiatry

## 2019-02-06 ENCOUNTER — Other Ambulatory Visit: Payer: Self-pay

## 2019-02-06 ENCOUNTER — Ambulatory Visit (INDEPENDENT_AMBULATORY_CARE_PROVIDER_SITE_OTHER): Payer: Medicare Other | Admitting: Podiatry

## 2019-02-06 ENCOUNTER — Ambulatory Visit
Admission: RE | Admit: 2019-02-06 | Discharge: 2019-02-06 | Disposition: A | Discharge status: Home / Self Care | Payer: Medicare Other | Source: Ambulatory Visit | Attending: Geriatric Medicine | Admitting: Geriatric Medicine

## 2019-02-06 VITALS — BP 93/70

## 2019-02-06 DIAGNOSIS — M8580 Other specified disorders of bone density and structure, unspecified site: Secondary | ICD-10-CM | POA: Insufficient documentation

## 2019-02-06 DIAGNOSIS — C9110 Chronic lymphocytic leukemia of B-cell type not having achieved remission: Secondary | ICD-10-CM | POA: Insufficient documentation

## 2019-02-06 DIAGNOSIS — R600 Localized edema: Secondary | ICD-10-CM | POA: Diagnosis not present

## 2019-02-06 DIAGNOSIS — E7800 Pure hypercholesterolemia, unspecified: Secondary | ICD-10-CM | POA: Insufficient documentation

## 2019-02-06 DIAGNOSIS — G629 Polyneuropathy, unspecified: Secondary | ICD-10-CM | POA: Insufficient documentation

## 2019-02-06 DIAGNOSIS — M76822 Posterior tibial tendinitis, left leg: Secondary | ICD-10-CM

## 2019-02-06 DIAGNOSIS — I712 Thoracic aortic aneurysm, without rupture, unspecified: Secondary | ICD-10-CM

## 2019-02-06 DIAGNOSIS — N183 Chronic kidney disease, stage 3 unspecified: Secondary | ICD-10-CM | POA: Insufficient documentation

## 2019-02-06 DIAGNOSIS — M779 Enthesopathy, unspecified: Secondary | ICD-10-CM | POA: Diagnosis not present

## 2019-02-06 MED ORDER — IOPAMIDOL (ISOVUE-370) INJECTION 76%
75.0000 mL | Freq: Once | INTRAVENOUS | Status: AC | PRN
  Administered 2019-02-06: 75 mL via INTRAVENOUS

## 2019-02-09 NOTE — Progress Notes (Signed)
Subjective:   Patient ID: Rebecca King, female   DOB: 85 y.o.   MRN: [id number]   HPI Patient has developed a lot of pain in the left ankle and states she does not remember injury or reason why it should be hurting like this.  States that it makes it hard to walk and is been present for several days and patient does not smoke and likes to be active   Review of Systems  All other systems reviewed and are negative.       Objective:  Physical Exam Vitals signs and nursing note reviewed.  Constitutional:      Appearance: She is well-developed.  Pulmonary:     Effort: Pulmonary effort is normal.  Musculoskeletal: Normal range of motion.  Skin:    General: Skin is warm.  Neurological:     Mental Status: She is alert.     Neurovascular status found to be intact muscle strength was adequate with exquisite discomfort noted in the sinus tarsi left with inflammation fluid buildup around the joint surface no other pathology noted.       Assessment:  Acute capsulitis of the sinus tarsi left with no other pathology noted currently a     Plan:  Should be x-ray reviewed and sterile prep applied and injected the sinus tarsi left 3 mg Kenalog 5 mg Xylocaine instructed on reduced activity and supportive shoes and reappoint to recheck in next several weeks  X-ray was negative for signs of fracture with moderate osteoporosis but no other pathology noted

## 2019-04-24 ENCOUNTER — Inpatient Hospital Stay (HOSPITAL_BASED_OUTPATIENT_CLINIC_OR_DEPARTMENT_OTHER): Payer: Medicare Other | Admitting: Hematology

## 2019-04-24 ENCOUNTER — Other Ambulatory Visit: Payer: Self-pay

## 2019-04-24 ENCOUNTER — Inpatient Hospital Stay: Payer: Medicare Other | Attending: Hematology

## 2019-04-24 VITALS — BP 161/84 | HR 78 | Temp 98.0°F | Resp 18 | Ht 64.0 in | Wt 157.4 lb

## 2019-04-24 DIAGNOSIS — R634 Abnormal weight loss: Secondary | ICD-10-CM | POA: Insufficient documentation

## 2019-04-24 DIAGNOSIS — R61 Generalized hyperhidrosis: Secondary | ICD-10-CM | POA: Diagnosis not present

## 2019-04-24 DIAGNOSIS — K5900 Constipation, unspecified: Secondary | ICD-10-CM | POA: Insufficient documentation

## 2019-04-24 DIAGNOSIS — I700 Atherosclerosis of aorta: Secondary | ICD-10-CM | POA: Diagnosis not present

## 2019-04-24 DIAGNOSIS — M8580 Other specified disorders of bone density and structure, unspecified site: Secondary | ICD-10-CM | POA: Insufficient documentation

## 2019-04-24 DIAGNOSIS — C9110 Chronic lymphocytic leukemia of B-cell type not having achieved remission: Secondary | ICD-10-CM

## 2019-04-24 DIAGNOSIS — N183 Chronic kidney disease, stage 3 (moderate): Secondary | ICD-10-CM | POA: Diagnosis not present

## 2019-04-24 DIAGNOSIS — K760 Fatty (change of) liver, not elsewhere classified: Secondary | ICD-10-CM | POA: Insufficient documentation

## 2019-04-24 DIAGNOSIS — R5383 Other fatigue: Secondary | ICD-10-CM | POA: Insufficient documentation

## 2019-04-24 DIAGNOSIS — Z79899 Other long term (current) drug therapy: Secondary | ICD-10-CM | POA: Insufficient documentation

## 2019-04-24 DIAGNOSIS — R05 Cough: Secondary | ICD-10-CM | POA: Insufficient documentation

## 2019-04-24 DIAGNOSIS — R109 Unspecified abdominal pain: Secondary | ICD-10-CM | POA: Insufficient documentation

## 2019-04-24 DIAGNOSIS — E7800 Pure hypercholesterolemia, unspecified: Secondary | ICD-10-CM | POA: Insufficient documentation

## 2019-04-24 DIAGNOSIS — Z87891 Personal history of nicotine dependence: Secondary | ICD-10-CM | POA: Diagnosis not present

## 2019-04-24 DIAGNOSIS — B029 Zoster without complications: Secondary | ICD-10-CM | POA: Insufficient documentation

## 2019-04-24 DIAGNOSIS — R509 Fever, unspecified: Secondary | ICD-10-CM | POA: Insufficient documentation

## 2019-04-24 LAB — CBC WITH DIFFERENTIAL/PLATELET
Abs Immature Granulocytes: 0 10*3/uL (ref 0.00–0.07)
Basophils Absolute: 0 10*3/uL (ref 0.0–0.1)
Basophils Relative: 0 %
Eosinophils Absolute: 0.2 10*3/uL (ref 0.0–0.5)
Eosinophils Relative: 1 %
HCT: 35 % — ABNORMAL LOW (ref 36.0–46.0)
Hemoglobin: 11.5 g/dL — ABNORMAL LOW (ref 12.0–15.0)
Lymphocytes Relative: 75 %
Lymphs Abs: 12.4 10*3/uL — ABNORMAL HIGH (ref 0.7–4.0)
MCH: 31.2 pg (ref 26.0–34.0)
MCHC: 32.9 g/dL (ref 30.0–36.0)
MCV: 94.9 fL (ref 80.0–100.0)
Monocytes Absolute: 0.5 10*3/uL (ref 0.1–1.0)
Monocytes Relative: 3 %
Neutro Abs: 3.5 10*3/uL (ref 1.7–17.7)
Neutrophils Relative %: 21 %
Platelets: 296 10*3/uL (ref 150–400)
RBC: 3.69 MIL/uL — ABNORMAL LOW (ref 3.87–5.11)
RDW: 14.6 % (ref 11.5–15.5)
WBC: 16.5 10*3/uL — ABNORMAL HIGH (ref 4.0–10.5)
nRBC: 0 % (ref 0.0–0.2)

## 2019-04-24 LAB — CMP (CANCER CENTER ONLY)
ALT: 11 U/L (ref 0–44)
AST: 23 U/L (ref 15–41)
Albumin: 4.3 g/dL (ref 3.5–5.0)
Alkaline Phosphatase: 77 U/L (ref 38–126)
Anion gap: 8 (ref 5–15)
BUN: 17 mg/dL (ref 8–23)
CO2: 29 mmol/L (ref 22–32)
Calcium: 10.2 mg/dL (ref 8.9–10.3)
Chloride: 103 mmol/L (ref 98–111)
Creatinine: 1.06 mg/dL — ABNORMAL HIGH (ref 0.44–1.00)
GFR, Est AFR Am: 53 mL/min — ABNORMAL LOW (ref >60)
GFR, Estimated: 46 mL/min — ABNORMAL LOW (ref >60)
Glucose, Bld: 90 mg/dL (ref 70–99)
Potassium: 4.5 mmol/L (ref 3.5–5.1)
Sodium: 140 mmol/L (ref 135–145)
Total Bilirubin: 0.4 mg/dL (ref 0.3–1.2)
Total Protein: 6.8 g/dL (ref 6.5–8.1)

## 2019-04-24 LAB — LACTATE DEHYDROGENASE: LDH: 133 U/L (ref 98–192)

## 2019-04-24 NOTE — Progress Notes (Signed)
HEMATOLOGY/ONCOLOGY CLINIC NOTE  Date of Service: 04/24/2019  Patient Care Team: [name], MD as PCP - General (Internal Medicine)  CHIEF COMPLAINTS/PURPOSE OF CONSULTATION:   F/u for CLL  HISTORY OF PRESENTING ILLNESS:   Rebecca King is a wonderful 85 y.o. female who has been referred to [place] by Dr .[name], MD  for evaluation and management of lymphocytosis.  Patient has a history of osteopenia, dyslipidemia, GERD and As per PCP notes a known diagnosis of CLL diagnosed in March 2011. Patient is not very aware of this diagnosis when asked today.  She notes that she recently has had a cough and a cold from early November 2017 which she has had difficulties getting over. Notes that she had some fevers and chills at the time and was treated with azithromycin for 5 days. She also notes that she go to steroid shot.  She notes no objective fevers at this time. She is currently afebrile with a temperature of 98.35F and saturating 97% on room air. She has a history of GERD and notes some upper abdominal discomfort. She had an ultrasound of the abdomen done by her primary care physician on 08/02/2016 which showed no acute intra-abdominal pathology. No splenomegaly..   She reports some increased fatigue and weight loss from 172 down to 160 pounds. She also notes some decreased appetite. She notes that her daughter had ALL and wonders what her blood counts mean.  She notes no overt palpable new lymph nodes. Currently no acute shortness of breath or chest pain.  Labs done in our clinic today showed no anemia with a normal hemoglobin of 12.3, mild leukocytosis of 15.5k with 8.8k lymphocytes. Normal platelets.  She had a chest x-ray which showed no acute infiltrates but overinflation suggestive of COPD. procalcitonin was in normal limits and did not suggest an ongoing bacterial process. Labs also did not show any hypogammaglobinemia.   INTERVAL HISTORY  Rebecca King is here for  management and evaluation of her CLL. The patient's last visit with [place] was on 10/24/2018. The pt reports that she is doing well overall.  The pt reports having night sweats. She has pain in abd which she believes is due to irregular bowel movements. Still takes miralax everyday with prune juice.  Of note since the patient's last visit, pt has had CT Angiography chest with contrast completed on 02/06/2019 with results revealing 1. Stable ascending thoracic aortic prominence measuring 4.3 x 4.3 cm. No dissection evident. There is aortic atherosclerosis as well as foci of great vessel calcification. Recommend annual imaging followup by CTA or MRA. This recommendation follows 2010 ACCF/AHA/AATS/ACR/ASA/SCA/SCAI/SIR/STS/SVM Guidelines for the Diagnosis and Management of Patients with Thoracic Aortic Disease.Circulation. 2010; [id number]. Aortic aneurysm NOS (ICD10-I71.9) 2. No edema or consolidation. Stable 5 x 4 mm nodular opacity in the right lower lobe. 3.  No demonstrable adenopathy. 4.  Hepatic steatosis.Aortic Atherosclerosis (ICD10-I70.0)..  Lab results today (04/24/19) of CBC w/diff and CMP is as follows: all values are WNL except for WBC at 16.5, RBC at 3.69, Hemglobin at 11.5, HCT at 35.0, Lymphs Abs at 12.4, Creatinine at 1.06, GFR Est Non AF Am at 46, GFR Est AFR Am at 53. 04/24/19 LDH at 133 which is WNL.  On review of systems, pt reports Pt has night sweats, constipation and abd pain. She denies chills and fever. Pt also denies noticing changes in lymphs.    MEDICAL HISTORY:  Past Medical History:  Diagnosis Date  . Abnormal nuclear  stress test 01/26/2009   POST EXERCISE PVC's NOTED SINCE 2003  . CLL (chronic lymphocytic leukemia) ([place]) 10/2009   DR. KALE  . Colon polyp    HYPERPLASTIC  . Diverticulosis   . DJD (degenerative joint disease)   . GERD (gastroesophageal reflux disease)   . Hearing loss    DR. SHOEMAKER  . Hypercholesterolemia   . Hyperlipidemia   . Iron  deficiency anemia 05/2010  . Lesion of breast    LEFT, SMALL  . Microscopic hematuria    DR. PETERSON  . Neuropathy    PERIPHERAL  . Osteopenia   . Renal cyst 10/2009   RENAL CYST ON CT 11/2009  . Renal disease    STAGE 3  . Tendon cysts    EXTENSOR 2MM RIGHT FIRST FINGER  . Vitamin D deficiency 10/2011  DJD knees Diverticulosis Hearing loss Poor vision CLL March 2011 Microscopic hematuria.  SURGICAL HISTORY: Past Surgical History:  Procedure Laterality Date  . LEFT HEART CATHETERIZATION WITH CORONARY ANGIOGRAM N/A 12/25/2011   Procedure: LEFT HEART CATHETERIZATION WITH CORONARY ANGIOGRAM;  Surgeon: Sinclair Grooms, MD;  Location: [facility] CATH LAB;  Service: Cardiovascular;  Laterality: N/A;    SOCIAL HISTORY: Social History   Socioeconomic History  . Marital status: Married    Spouse name: Not on file  . Number of children: Not on file  . Years of education: Not on file  . Highest education level: Not on file  Occupational History  . Occupation: UNEMPLOYED  Social Needs  . Financial resource strain: Not on file  . Food insecurity    Worry: Not on file    Inability: Not on file  . Transportation needs    Medical: Not on file    Non-medical: Not on file  Tobacco Use  . Smoking status: Former Smoker    Quit date: 06/25/1980    Years since quitting: 38.8  . Smokeless tobacco: Never Used  Substance and Sexual Activity  . Alcohol use: Yes  . Drug use: No  . Sexual activity: Not on file  Lifestyle  . Physical activity    Days per week: Not on file    Minutes per session: Not on file  . Stress: Not on file  Relationships  . Social Herbalist on phone: Not on file    Gets together: Not on file    Attends religious service: Not on file    Active member of club or organization: Not on file    Attends meetings of clubs or organizations: Not on file    Relationship status: Not on file  . Intimate partner violence    Fear of current or ex partner: Not on  file    Emotionally abused: Not on file    Physically abused: Not on file    Forced sexual activity: Not on file  Other Topics Concern  . Not on file  Social History Narrative  . Not on file    FAMILY HISTORY: Family History  Family history unknown: Yes    ALLERGIES:  is allergic to amoxicillin; ciprofloxacin; macrodantin [nitrofurantoin]; and sulfa antibiotics.  MEDICATIONS:  Current Outpatient Medications  Medication Sig Dispense Refill  . acetaminophen (TYLENOL) 500 MG tablet Take 500 mg by mouth 2 (two) times daily.     . Ascorbic Acid (VITAMIN C PO) Take 1 tablet by mouth daily.    Marland Kitchen aspirin 81 MG tablet Take 81 mg by mouth daily.    . Coenzyme Q10 (CO  Q 10 PO) Take 1 tablet by mouth daily.    . fluorometholone (FML) 0.1 % ophthalmic suspension     . glucosamine-chondroitin 500-400 MG tablet Take 1 tablet 3 (three) times a week by mouth.    . metoprolol succinate (TOPROL-XL) 25 MG 24 hr tablet Take 0.5 tablets (12.5 mg total) by mouth daily. 45 tablet 3  . Multiple Vitamin (MULITIVITAMIN WITH MINERALS) TABS Take 1 tablet by mouth daily.    Marland Kitchen NITROSTAT 0.4 MG SL tablet DISSOLVE ONE TABLET UNDERTONGUE AS DIRECTED AS    NEEDED FOR CHEST PAIN (Patient taking differently: Place 0.4 mg under the tongue every 5 (five) minutes as needed for chest pain. ) 25 tablet 0  . ondansetron (ZOFRAN ODT) 4 MG disintegrating tablet 4mg  ODT q4 hours prn nausea/vomit 8 tablet 0  . polyethylene glycol (MIRALAX / GLYCOLAX) packet Take 17 g by mouth every other day.     . Probiotic Product (ALIGN PO) Take 1 capsule by mouth as needed.     . rosuvastatin (CRESTOR) 5 MG tablet Take 5 mg by mouth at bedtime.    . traMADol (ULTRAM) 50 MG tablet Take 50 mg by mouth 2 (two) times daily as needed for moderate pain.      No current facility-administered medications for this visit.     REVIEW OF SYSTEMS:   A 10+ POINT REVIEW OF SYSTEMS WAS OBTAINED including neurology, dermatology, psychiatry, cardiac,  respiratory, lymph, extremities, GI, GU, Musculoskeletal, constitutional, breasts, reproductive, HEENT.  All pertinent positives are noted in the HPI.  All others are negative.    PHYSICAL EXAMINATION: ECOG PERFORMANCE STATUS: 2 - Symptomatic, <50% confined to bed    Vitals:   04/24/19 1513  BP: (!) 161/84  Pulse: 78  Resp: 18  Temp: 98 F (36.7 C)  SpO2: 99%   Filed Weights   04/24/19 1513  Weight: 157 lb 6.4 oz (71.4 kg)   Body mass index is 27.02 kg/m.  GENERAL:alert, in no acute distress and comfortable SKIN: no acute rashes, no significant lesions EYES: conjunctiva are pink and non-injected, sclera anicteric OROPHARYNX: MMM, no exudates, no oropharyngeal erythema or ulceration NECK: supple, no JVD LYMPH:  no palpable lymphadenopathy in the cervical, axillary or inguinal regions LUNGS: clear to auscultation b/l with normal respiratory effort HEART: regular rate & rhythm ABDOMEN:  normoactive bowel sounds , non tender, not distended. Extremity: no pedal edema PSYCH: alert & oriented x 3 with fluent speech NEURO: no focal motor/sensory deficits    LABORATORY DATA:  I have reviewed the data as listed  . CBC Latest Ref Rng & Units 04/24/2019 10/24/2018 08/06/2018  WBC 4.0 - 10.5 K/uL 16.5(H) 14.3(H) -  Hemoglobin 12.0 - 15.0 g/dL 11.5(L) 11.3(L) 11.9(L)  Hematocrit 36.0 - 46.0 % 35.0(L) 34.0(L) 35.0(L)  Platelets 150 - 400 K/uL 296 298 -   . CBC    Component Value Date/Time   WBC 16.5 (H) 04/24/2019 1458   RBC 3.69 (L) 04/24/2019 1458   HGB 11.5 (L) 04/24/2019 1458   HGB 11.3 (L) 04/25/2018 1458   HGB 12.2 04/26/2017 1344   HCT 35.0 (L) 04/24/2019 1458   HCT 36.3 04/26/2017 1344   PLT 296 04/24/2019 1458   PLT 316 04/25/2018 1458   PLT 296 04/26/2017 1344   MCV 94.9 04/24/2019 1458   MCV 94.3 04/26/2017 1344   MCH 31.2 04/24/2019 1458   MCHC 32.9 04/24/2019 1458   RDW 14.6 04/24/2019 1458   RDW 15.4 (H) 04/26/2017 1344  LYMPHSABS 12.4 (H) 04/24/2019  1458   LYMPHSABS 7.8 (H) 04/26/2017 1344   MONOABS 0.5 04/24/2019 1458   MONOABS 0.8 04/26/2017 1344   EOSABS 0.2 04/24/2019 1458   EOSABS 0.1 04/26/2017 1344   BASOSABS 0.0 04/24/2019 1458   BASOSABS 0.0 04/26/2017 1344   . CMP Latest Ref Rng & Units 04/24/2019 10/24/2018 08/06/2018  Glucose 70 - 99 mg/dL 90 93 114(H)  BUN 8 - 23 mg/dL 17 13 11   Creatinine 0.44 - 1.00 mg/dL 1.06(H) 0.98 0.80  Sodium 135 - 145 mmol/L 140 139 139  Potassium 3.5 - 5.1 mmol/L 4.5 4.7 3.6  Chloride 98 - 111 mmol/L 103 105 107  CO2 22 - 32 mmol/L 29 26 -  Calcium 8.9 - 10.3 mg/dL 10.2 10.0 -  Total Protein 6.5 - 8.1 g/dL 6.8 6.7 -  Total Bilirubin 0.3 - 1.2 mg/dL 0.4 0.6 -  Alkaline Phos 38 - 126 U/L 77 66 -  AST 15 - 41 U/L 23 23 -  ALT 0 - 44 U/L 11 13 -   . Lab Results  Component Value Date   LDH 186 10/24/2018             RADIOGRAPHIC STUDIES: I have personally reviewed the radiological images as listed and agreed with the findings in the report. No results found.  ASSESSMENT & PLAN:   85 y.o. female with   1) Rai Stage 1 Chronic lymphocytic leukemia. Apparently was previously diagnosed in March 2011 but no data available from this. CLL Fish prognostic panel showed predominantly 13q deletion in 63% of the cells. This typically suggests a good prognosis small clone with trisomy 73 which is an intermediate prognosis.   Normal LDH levels. No palpable splenomegaly. No significant cytopenias at this time. Minimal cervical lymphadenopathy. No significant weight loss at this time.  No evidence of hypogammaglobinemia related to CLL, IgG levels tested in Jan were normal.  #2) Shingles- Resolved. -PCP could consider referral to Interventional pain specialist for nerve block for post-herpetic neuralgias     PLAN: -Discussed pt labwork today, 04/24/19;  all values are WNL except for WBC at 16.5, RBC at 3.69, Hemglobin at 11.5, HCT at 35.0, Lymphs Abs at 12.4, Creatinine at 1.06, GFR Est  Non AF Am at 46, GFR Est AFR Am at 53. -Discussed 04/24/19 LDH at 133 which is WNL. -Discussed speaking with Dr. Daneen Schick to confirm continued use of metoprolol succinate. -Discussed increase in white blood counts slow. -no constitutional symptoms or new significant Lnadenopathy or splenomegaly -no indication for rx of CLL at this time. -Follow up in 6 months  FOLLOW UP: RTC with Dr Irene Limbo with labs in 6 months  The total time spent in the appt was 15 minutes and more than 50% was on counseling and direct patient cares.  All of the patient's questions were answered with apparent satisfaction. The patient knows to call the clinic with any problems, questions or concerns.     Sullivan Lone MD Rebecca AAHIVMS [facility] Hematology/Oncology Physician [facility]  (Office):       [phone] (Work cell):  [phone] (Fax):           [phone]  04/24/2019 2:14 PM  I, Jacqualyn Posey am acting as a Education administrator for Chauncey Cruel, MD.   .I have reviewed the above documentation for accuracy and completeness, and I agree with the above. Brunetta Genera MD

## 2019-04-25 ENCOUNTER — Telehealth: Payer: Self-pay | Admitting: Hematology

## 2019-04-25 NOTE — Telephone Encounter (Signed)
Patient called because we had scheduled an appt for his spouse on  Wednesday and needed to reschedule it, because she had an appt already scheduled that day.  Rescheduled appt and patient is aware of the new appt date and time.

## 2019-04-25 NOTE — Telephone Encounter (Signed)
Scheduled appt per 9/10 los.  Spoke with patient spouse and he is aware of the appt date and time.

## 2019-05-29 DIAGNOSIS — Z23 Encounter for immunization: Secondary | ICD-10-CM | POA: Diagnosis not present

## 2019-08-28 DIAGNOSIS — Z23 Encounter for immunization: Secondary | ICD-10-CM | POA: Diagnosis not present

## 2019-09-03 DIAGNOSIS — H25011 Cortical age-related cataract, right eye: Secondary | ICD-10-CM | POA: Diagnosis not present

## 2019-09-03 DIAGNOSIS — H2511 Age-related nuclear cataract, right eye: Secondary | ICD-10-CM | POA: Diagnosis not present

## 2019-10-07 DIAGNOSIS — H2511 Age-related nuclear cataract, right eye: Secondary | ICD-10-CM | POA: Diagnosis not present

## 2019-10-07 DIAGNOSIS — H25011 Cortical age-related cataract, right eye: Secondary | ICD-10-CM | POA: Diagnosis not present

## 2019-10-07 DIAGNOSIS — H25811 Combined forms of age-related cataract, right eye: Secondary | ICD-10-CM | POA: Diagnosis not present

## 2019-10-21 ENCOUNTER — Inpatient Hospital Stay: Payer: Medicare Other | Attending: Hematology

## 2019-10-21 ENCOUNTER — Inpatient Hospital Stay (HOSPITAL_BASED_OUTPATIENT_CLINIC_OR_DEPARTMENT_OTHER): Payer: Medicare Other | Admitting: Hematology

## 2019-10-21 ENCOUNTER — Telehealth: Payer: Self-pay | Admitting: Hematology

## 2019-10-21 ENCOUNTER — Other Ambulatory Visit: Payer: Self-pay

## 2019-10-21 VITALS — BP 143/72 | HR 73 | Temp 97.8°F | Resp 17 | Ht 64.0 in | Wt 160.4 lb

## 2019-10-21 DIAGNOSIS — C9110 Chronic lymphocytic leukemia of B-cell type not having achieved remission: Secondary | ICD-10-CM | POA: Diagnosis not present

## 2019-10-21 DIAGNOSIS — M8580 Other specified disorders of bone density and structure, unspecified site: Secondary | ICD-10-CM | POA: Insufficient documentation

## 2019-10-21 DIAGNOSIS — Z79899 Other long term (current) drug therapy: Secondary | ICD-10-CM | POA: Diagnosis not present

## 2019-10-21 DIAGNOSIS — K219 Gastro-esophageal reflux disease without esophagitis: Secondary | ICD-10-CM | POA: Diagnosis not present

## 2019-10-21 DIAGNOSIS — D72820 Lymphocytosis (symptomatic): Secondary | ICD-10-CM | POA: Diagnosis not present

## 2019-10-21 DIAGNOSIS — R5383 Other fatigue: Secondary | ICD-10-CM | POA: Insufficient documentation

## 2019-10-21 DIAGNOSIS — Z87891 Personal history of nicotine dependence: Secondary | ICD-10-CM | POA: Insufficient documentation

## 2019-10-21 DIAGNOSIS — E785 Hyperlipidemia, unspecified: Secondary | ICD-10-CM | POA: Insufficient documentation

## 2019-10-21 LAB — CMP (CANCER CENTER ONLY)
ALT: 11 U/L (ref 0–44)
AST: 21 U/L (ref 15–41)
Albumin: 4.2 g/dL (ref 3.5–5.0)
Alkaline Phosphatase: 73 U/L (ref 38–126)
Anion gap: 8 (ref 5–15)
BUN: 20 mg/dL (ref 8–23)
CO2: 28 mmol/L (ref 22–32)
Calcium: 10 mg/dL (ref 8.9–10.3)
Chloride: 104 mmol/L (ref 98–111)
Creatinine: 1.04 mg/dL — ABNORMAL HIGH (ref 0.44–1.00)
GFR, Est AFR Am: 54 mL/min — ABNORMAL LOW (ref >60)
GFR, Estimated: 47 mL/min — ABNORMAL LOW (ref >60)
Glucose, Bld: 96 mg/dL (ref 70–99)
Potassium: 4.7 mmol/L (ref 3.5–5.1)
Sodium: 140 mmol/L (ref 135–145)
Total Bilirubin: 0.5 mg/dL (ref 0.3–1.2)
Total Protein: 6.8 g/dL (ref 6.5–8.1)

## 2019-10-21 LAB — CBC WITH DIFFERENTIAL/PLATELET
Abs Immature Granulocytes: 0.03 10*3/uL (ref 0.00–0.07)
Basophils Absolute: 0.1 10*3/uL (ref 0.0–0.1)
Basophils Relative: 0 %
Eosinophils Absolute: 0.2 10*3/uL (ref 0.0–0.5)
Eosinophils Relative: 1 %
HCT: 35.7 % — ABNORMAL LOW (ref 36.0–46.0)
Hemoglobin: 11.7 g/dL — ABNORMAL LOW (ref 12.0–15.0)
Immature Granulocytes: 0 %
Lymphocytes Relative: 68 %
Lymphs Abs: 10.7 10*3/uL — ABNORMAL HIGH (ref 0.7–4.0)
MCH: 30.9 pg (ref 26.0–34.0)
MCHC: 32.8 g/dL (ref 30.0–36.0)
MCV: 94.2 fL (ref 80.0–100.0)
Monocytes Absolute: 1 10*3/uL (ref 0.1–1.0)
Monocytes Relative: 6 %
Neutro Abs: 4.1 10*3/uL (ref 1.7–7.7)
Neutrophils Relative %: 25 %
Platelets: 283 10*3/uL (ref 150–400)
RBC: 3.79 MIL/uL — ABNORMAL LOW (ref 3.87–5.11)
RDW: 14.6 % (ref 11.5–15.5)
WBC: 16 10*3/uL — ABNORMAL HIGH (ref 4.0–10.5)
nRBC: 0 % (ref 0.0–0.2)

## 2019-10-21 LAB — LACTATE DEHYDROGENASE: LDH: 150 U/L (ref 98–192)

## 2019-10-21 NOTE — Progress Notes (Signed)
HEMATOLOGY/ONCOLOGY CLINIC NOTE  Date of Service: 10/21/2019  Patient Care Team: [name], MD as PCP - General (Internal Medicine)  CHIEF COMPLAINTS/PURPOSE OF CONSULTATION:   F/u for CLL  HISTORY OF PRESENTING ILLNESS:   Rebecca King is a wonderful 86 y.o. female who has been referred to [place] by Dr .[name], MD  for evaluation and management of lymphocytosis.  Patient has a history of osteopenia, dyslipidemia, GERD and As per PCP notes a known diagnosis of CLL diagnosed in March 2011. Patient is not very aware of this diagnosis when asked today.  She notes that she recently has had a cough and a cold from early November 2017 which she has had difficulties getting over. Notes that she had some fevers and chills at the time and was treated with azithromycin for 5 days. She also notes that she go to steroid shot.  She notes no objective fevers at this time. She is currently afebrile with a temperature of 98.53F and saturating 97% on room air. She has a history of GERD and notes some upper abdominal discomfort. She had an ultrasound of the abdomen done by her primary care physician on 08/02/2016 which showed no acute intra-abdominal pathology. No splenomegaly..   She reports some increased fatigue and weight loss from 172 down to 160 pounds. She also notes some decreased appetite. She notes that her daughter had ALL and wonders what her blood counts mean.  She notes no overt palpable new lymph nodes. Currently no acute shortness of breath or chest pain.  Labs done in our clinic today showed no anemia with a normal hemoglobin of 12.3, mild leukocytosis of 15.5k with 8.8k lymphocytes. Normal platelets.  She had a chest x-ray which showed no acute infiltrates but overinflation suggestive of COPD. procalcitonin was in normal limits and did not suggest an ongoing bacterial process. Labs also did not show any hypogammaglobinemia.   INTERVAL HISTORY  [name] is here for  management and evaluation of her CLL. The patient's last visit with [place] was on 04/24/2019. The pt reports that she is doing well overall.  The pt reports that she has had both doses of the COVID19 vaccine and denies any symptoms or concerns. She had cataract surgery which has improved her vision and is allowing her to enjoy her hobbies more fully. She was diagnosed with a small aortic aneurysm in the interim, which they plan on watching for now. They have continued to take safety precautions at her assisted living facility.   Lab results today (10/21/19) of CBC w/diff and CMP is as follows: all values are WNL except for WBC at 16.0K, RBC at 3.79, Hgb at 11.7, HCT at 35.7, Lymphs Abs at 10.7K, Creatinine at 1.04, GFR Est Non Af Am at 47. 10/21/2019 LDH at 150  On review of systems, pt denies fevers, chills, night sweats, unexpected weight loss, new lumps/bumps and any other symptoms.   MEDICAL HISTORY:  Past Medical History:  Diagnosis Date  . Abnormal nuclear stress test 01/26/2009   POST EXERCISE PVC's NOTED SINCE 2003  . CLL (chronic lymphocytic leukemia) ([place]) 10/2009   DR.   . Colon polyp    HYPERPLASTIC  . Diverticulosis   . DJD (degenerative joint disease)   . GERD (gastroesophageal reflux disease)   . Hearing loss    DR. SHOEMAKER  . Hypercholesterolemia   . Hyperlipidemia   . Iron deficiency anemia 05/2010  . Lesion of breast    LEFT, SMALL  .  Microscopic hematuria    DR. PETERSON  . Neuropathy    PERIPHERAL  . Osteopenia   . Renal cyst 10/2009   RENAL CYST ON CT 11/2009  . Renal disease    STAGE 3  . Tendon cysts    EXTENSOR 2MM RIGHT FIRST FINGER  . Vitamin D deficiency 10/2011  DJD knees Diverticulosis Hearing loss Poor vision CLL March 2011 Microscopic hematuria.  SURGICAL HISTORY: Past Surgical History:  Procedure Laterality Date  . LEFT HEART CATHETERIZATION WITH CORONARY ANGIOGRAM N/A 12/25/2011   Procedure: LEFT HEART CATHETERIZATION WITH CORONARY  ANGIOGRAM;  Surgeon: Sinclair Grooms, MD;  Location: [facility] CATH LAB;  Service: Cardiovascular;  Laterality: N/A;    SOCIAL HISTORY: Social History   Socioeconomic History  . Marital status: Married    Spouse name: Not on file  . Number of children: Not on file  . Years of education: Not on file  . Highest education level: Not on file  Occupational History  . Occupation: UNEMPLOYED  Tobacco Use  . Smoking status: Former Smoker    Quit date: 06/25/1980    Years since quitting: 39.3  . Smokeless tobacco: Never Used  Substance and Sexual Activity  . Alcohol use: Yes  . Drug use: No  . Sexual activity: Not on file  Other Topics Concern  . Not on file  Social History Narrative  . Not on file   Social Determinants of Health   Financial Resource Strain:   . Difficulty of Paying Living Expenses: Not on file  Food Insecurity:   . Worried About Charity fundraiser in the Last Year: Not on file  . Ran Out of Food in the Last Year: Not on file  Transportation Needs:   . Lack of Transportation (Medical): Not on file  . Lack of Transportation (Non-Medical): Not on file  Physical Activity:   . Days of Exercise per Week: Not on file  . Minutes of Exercise per Session: Not on file  Stress:   . Feeling of Stress : Not on file  Social Connections:   . Frequency of Communication with Friends and Family: Not on file  . Frequency of Social Gatherings with Friends and Family: Not on file  . Attends Religious Services: Not on file  . Active Member of Clubs or Organizations: Not on file  . Attends Archivist Meetings: Not on file  . Marital Status: Not on file  Intimate Partner Violence:   . Fear of Current or Ex-Partner: Not on file  . Emotionally Abused: Not on file  . Physically Abused: Not on file  . Sexually Abused: Not on file    FAMILY HISTORY: Family History  Family history unknown: Yes    ALLERGIES:  is allergic to amoxicillin; ciprofloxacin; macrodantin  [nitrofurantoin]; and sulfa antibiotics.  MEDICATIONS:  Current Outpatient Medications  Medication Sig Dispense Refill  . acetaminophen (TYLENOL) 500 MG tablet Take 500 mg by mouth 2 (two) times daily.     . Ascorbic Acid (VITAMIN C PO) Take 1 tablet by mouth daily.    Marland Kitchen aspirin 81 MG tablet Take 81 mg by mouth daily.    . Coenzyme Q10 (CO Q 10 PO) Take 1 tablet by mouth daily.    . fluorometholone (FML) 0.1 % ophthalmic suspension     . glucosamine-chondroitin 500-400 MG tablet Take 1 tablet 3 (three) times a week by mouth.    . metoprolol succinate (TOPROL-XL) 25 MG 24 hr tablet Take 0.5 tablets (12.5  mg total) by mouth daily. 45 tablet 3  . Multiple Vitamin (MULITIVITAMIN WITH MINERALS) TABS Take 1 tablet by mouth daily.    Marland Kitchen NITROSTAT 0.4 MG SL tablet DISSOLVE ONE TABLET UNDERTONGUE AS DIRECTED AS    NEEDED FOR CHEST PAIN (Patient taking differently: Place 0.4 mg under the tongue every 5 (five) minutes as needed for chest pain. ) 25 tablet 0  . ondansetron (ZOFRAN ODT) 4 MG disintegrating tablet 4mg  ODT q4 hours prn nausea/vomit 8 tablet 0  . polyethylene glycol (MIRALAX / GLYCOLAX) packet Take 17 g by mouth every other day.     . Probiotic Product (ALIGN PO) Take 1 capsule by mouth as needed.     . rosuvastatin (CRESTOR) 5 MG tablet Take 5 mg by mouth at bedtime.    . traMADol (ULTRAM) 50 MG tablet Take 50 mg by mouth 2 (two) times daily as needed for moderate pain.      No current facility-administered medications for this visit.    REVIEW OF SYSTEMS:   A 10+ POINT REVIEW OF SYSTEMS WAS OBTAINED including neurology, dermatology, psychiatry, cardiac, respiratory, lymph, extremities, GI, GU, Musculoskeletal, constitutional, breasts, reproductive, HEENT.  All pertinent positives are noted in the HPI.  All others are negative.   PHYSICAL EXAMINATION: ECOG PERFORMANCE STATUS: 2 - Symptomatic, <50% confined to bed    Vitals:   10/21/19 1446  BP: (!) 143/72  Pulse: 73  Resp: 17   Temp: 97.8 F (36.6 C)  SpO2: 99%   Filed Weights   10/21/19 1446  Weight: 160 lb 6.4 oz (72.8 kg)   Body mass index is 27.53 kg/m.  Exam was given in a chair   GENERAL:alert, in no acute distress and comfortable SKIN: no acute rashes, no significant lesions EYES: conjunctiva are pink and non-injected, sclera anicteric OROPHARYNX: MMM, no exudates, no oropharyngeal erythema or ulceration NECK: supple, no JVD LYMPH:  no palpable lymphadenopathy in the cervical, axillary or inguinal regions LUNGS: clear to auscultation b/l with normal respiratory effort HEART: regular rate & rhythm ABDOMEN:  normoactive bowel sounds , non tender, not distended. No palpable hepatosplenomegaly.  Extremity: no pedal edema PSYCH: alert & oriented x 3 with fluent speech NEURO: no focal motor/sensory deficits  LABORATORY DATA:  I have reviewed the data as listed  . CBC Latest Ref Rng & Units 10/21/2019 04/24/2019 10/24/2018  WBC 4.0 - 10.5 K/uL 16.0(H) 16.5(H) 14.3(H)  Hemoglobin 12.0 - 15.0 g/dL 11.7(L) 11.5(L) 11.3(L)  Hematocrit 36.0 - 46.0 % 35.7(L) 35.0(L) 34.0(L)  Platelets 150 - 400 K/uL 283 296 298   . CBC    Component Value Date/Time   WBC 16.0 (H) 10/21/2019 1354   RBC 3.79 (L) 10/21/2019 1354   HGB 11.7 (L) 10/21/2019 1354   HGB 11.3 (L) 04/25/2018 1458   HGB 12.2 04/26/2017 1344   HCT 35.7 (L) 10/21/2019 1354   HCT 36.3 04/26/2017 1344   PLT 283 10/21/2019 1354   PLT 316 04/25/2018 1458   PLT 296 04/26/2017 1344   MCV 94.2 10/21/2019 1354   MCV 94.3 04/26/2017 1344   MCH 30.9 10/21/2019 1354   MCHC 32.8 10/21/2019 1354   RDW 14.6 10/21/2019 1354   RDW 15.4 (H) 04/26/2017 1344   LYMPHSABS 10.7 (H) 10/21/2019 1354   LYMPHSABS 7.8 (H) 04/26/2017 1344   MONOABS 1.0 10/21/2019 1354   MONOABS 0.8 04/26/2017 1344   EOSABS 0.2 10/21/2019 1354   EOSABS 0.1 04/26/2017 1344   BASOSABS 0.1 10/21/2019 1354   BASOSABS 0.0  04/26/2017 1344   . CMP Latest Ref Rng & Units 10/21/2019  04/24/2019 10/24/2018  Glucose 70 - 99 mg/dL 96 90 93  BUN 8 - 23 mg/dL 20 17 13   Creatinine 0.44 - 1.00 mg/dL 1.04(H) 1.06(H) 0.98  Sodium 135 - 145 mmol/L 140 140 139  Potassium 3.5 - 5.1 mmol/L 4.7 4.5 4.7  Chloride 98 - 111 mmol/L 104 103 105  CO2 22 - 32 mmol/L 28 29 26   Calcium 8.9 - 10.3 mg/dL 10.0 10.2 10.0  Total Protein 6.5 - 8.1 g/dL 6.8 6.8 6.7  Total Bilirubin 0.3 - 1.2 mg/dL 0.5 0.4 0.6  Alkaline Phos 38 - 126 U/L 73 77 66  AST 15 - 41 U/L 21 23 23   ALT 0 - 44 U/L 11 11 13    . Lab Results  Component Value Date   LDH 150 10/21/2019             RADIOGRAPHIC STUDIES: I have personally reviewed the radiological images as listed and agreed with the findings in the report. No results found.  ASSESSMENT & PLAN:   86 y.o. female with   1) Rai Stage 1 Chronic lymphocytic leukemia. Apparently was previously diagnosed in March 2011 but no data available from this. CLL Fish prognostic panel showed predominantly 13q deletion in 63% of the cells. This typically suggests a good prognosis small clone with trisomy 29 which is an intermediate prognosis.   Normal LDH levels. No palpable splenomegaly. No significant cytopenias at this time. Minimal cervical lymphadenopathy. No significant weight loss at this time.  No evidence of hypogammaglobinemia related to CLL, IgG levels tested in Jan were normal.  #2) Shingles- Resolved. -PCP could consider referral to Interventional pain specialist for nerve block for post-herpetic neuralgias     PLAN: -Discussed pt labwork today, 10/21/19; WBC are steady, Hgb and PLT are normal, blood chemistries are okay.  -Discussed 10/21/2019 LDH is WNL -No constitutional symptoms or new/significant Lymphadenopathy or splenomegaly -No indication for rx of CLL at this time. -Follow up in 6 months with labs    FOLLOW UP: RTC with Dr Irene Limbo with labs in 6 months   The total time spent in the appt was 25 minutes and more than 50% was on  counseling and direct patient cares.  All of the patient's questions were answered with apparent satisfaction. The patient knows to call the clinic with any problems, questions or concerns.    Sullivan Lone MD [place] AAHIVMS [facility] Hematology/Oncology Physician [facility]  (Office):       [phone] (Work cell):  [phone] (Fax):           [phone]  10/21/2019 3:32 PM  I, Yevette Edwards, am acting as a scribe for Dr. Sullivan Lone.   .I have reviewed the above documentation for accuracy and completeness, and I agree with the above. Brunetta Genera MD

## 2019-10-21 NOTE — Telephone Encounter (Signed)
Scheduled per los. Gave avs and calendar  

## 2019-10-22 ENCOUNTER — Other Ambulatory Visit: Payer: Medicare Other

## 2019-10-22 ENCOUNTER — Ambulatory Visit: Payer: Medicare Other | Admitting: Hematology

## 2019-11-12 ENCOUNTER — Emergency Department (HOSPITAL_COMMUNITY): Payer: Medicare Other

## 2019-11-12 ENCOUNTER — Encounter (HOSPITAL_COMMUNITY): Payer: Self-pay | Admitting: Emergency Medicine

## 2019-11-12 ENCOUNTER — Emergency Department (HOSPITAL_COMMUNITY)
Admission: EM | Admit: 2019-11-12 | Discharge: 2019-11-12 | Disposition: A | Discharge status: Home / Self Care | Payer: Medicare Other | Attending: Emergency Medicine | Admitting: Emergency Medicine

## 2019-11-12 ENCOUNTER — Other Ambulatory Visit: Payer: Self-pay

## 2019-11-12 DIAGNOSIS — R079 Chest pain, unspecified: Secondary | ICD-10-CM | POA: Diagnosis not present

## 2019-11-12 DIAGNOSIS — Z7982 Long term (current) use of aspirin: Secondary | ICD-10-CM | POA: Diagnosis not present

## 2019-11-12 DIAGNOSIS — Z87891 Personal history of nicotine dependence: Secondary | ICD-10-CM | POA: Diagnosis not present

## 2019-11-12 DIAGNOSIS — R0602 Shortness of breath: Secondary | ICD-10-CM | POA: Insufficient documentation

## 2019-11-12 DIAGNOSIS — R55 Syncope and collapse: Secondary | ICD-10-CM

## 2019-11-12 DIAGNOSIS — R42 Dizziness and giddiness: Secondary | ICD-10-CM | POA: Diagnosis not present

## 2019-11-12 DIAGNOSIS — Z79899 Other long term (current) drug therapy: Secondary | ICD-10-CM | POA: Insufficient documentation

## 2019-11-12 DIAGNOSIS — N183 Chronic kidney disease, stage 3 unspecified: Secondary | ICD-10-CM | POA: Insufficient documentation

## 2019-11-12 DIAGNOSIS — Z856 Personal history of leukemia: Secondary | ICD-10-CM | POA: Diagnosis not present

## 2019-11-12 DIAGNOSIS — R110 Nausea: Secondary | ICD-10-CM | POA: Diagnosis present

## 2019-11-12 DIAGNOSIS — I712 Thoracic aortic aneurysm, without rupture: Secondary | ICD-10-CM | POA: Diagnosis not present

## 2019-11-12 DIAGNOSIS — M1712 Unilateral primary osteoarthritis, left knee: Secondary | ICD-10-CM | POA: Diagnosis not present

## 2019-11-12 LAB — CBC WITH DIFFERENTIAL/PLATELET
Abs Immature Granulocytes: 0.05 10*3/uL (ref 0.00–0.07)
Basophils Absolute: 0 10*3/uL (ref 0.0–0.1)
Basophils Relative: 0 %
Eosinophils Absolute: 0 10*3/uL (ref 0.0–0.5)
Eosinophils Relative: 0 %
HCT: 36.4 % (ref 36.0–46.0)
Hemoglobin: 11.7 g/dL — ABNORMAL LOW (ref 12.0–15.0)
Immature Granulocytes: 0 %
Lymphocytes Relative: 52 %
Lymphs Abs: 6 10*3/uL — ABNORMAL HIGH (ref 0.7–4.0)
MCH: 30.7 pg (ref 26.0–34.0)
MCHC: 32.1 g/dL (ref 30.0–36.0)
MCV: 95.5 fL (ref 80.0–100.0)
Monocytes Absolute: 0.1 10*3/uL (ref 0.1–1.0)
Monocytes Relative: 1 %
Neutro Abs: 5.5 10*3/uL (ref 1.7–7.7)
Neutrophils Relative %: 47 %
Platelets: 292 10*3/uL (ref 150–400)
RBC: 3.81 MIL/uL — ABNORMAL LOW (ref 3.87–5.11)
RDW: 14.3 % (ref 11.5–15.5)
WBC: 11.6 10*3/uL — ABNORMAL HIGH (ref 4.0–10.5)
nRBC: 0.2 % (ref 0.0–0.2)

## 2019-11-12 LAB — BASIC METABOLIC PANEL
Anion gap: 8 (ref 5–15)
BUN: 19 mg/dL (ref 8–23)
CO2: 26 mmol/L (ref 22–32)
Calcium: 10.1 mg/dL (ref 8.9–10.3)
Chloride: 108 mmol/L (ref 98–111)
Creatinine, Ser: 1.1 mg/dL — ABNORMAL HIGH (ref 0.44–1.00)
GFR calc Af Amer: 51 mL/min — ABNORMAL LOW (ref >60)
GFR calc non Af Amer: 44 mL/min — ABNORMAL LOW (ref >60)
Glucose, Bld: 93 mg/dL (ref 70–99)
Potassium: 4 mmol/L (ref 3.5–5.1)
Sodium: 142 mmol/L (ref 135–145)

## 2019-11-12 LAB — TROPONIN I (HIGH SENSITIVITY)
Troponin I (High Sensitivity): 4 ng/L (ref <18)
Troponin I (High Sensitivity): 5 ng/L (ref <18)

## 2019-11-12 MED ORDER — IOHEXOL 350 MG/ML SOLN
100.0000 mL | Freq: Once | INTRAVENOUS | Status: AC | PRN
  Administered 2019-11-12: 80 mL via INTRAVENOUS

## 2019-11-12 MED ORDER — SODIUM CHLORIDE (PF) 0.9 % IJ SOLN
INTRAMUSCULAR | Status: AC
  Filled 2019-11-12: qty 50

## 2019-11-12 NOTE — ED Notes (Signed)
Pt transported to CT ?

## 2019-11-12 NOTE — ED Notes (Signed)
Pt assisted with using the bedside commode.

## 2019-11-12 NOTE — ED Notes (Signed)
Dr Pickering at bedside 

## 2019-11-12 NOTE — ED Triage Notes (Signed)
Patient here from [facility] with complaints of possible reaction to steroid  Injection to left knee today. States that when she got in the car she became sweaty and nauseous.

## 2019-11-12 NOTE — ED Provider Notes (Signed)
[facility] DEPT Provider Note   CSN: [id number] Arrival date & time: 11/12/19  1548     History Chief Complaint  Patient presents with   Medication Reaction    Rebecca King is a 86 y.o. female.  HPI Patient presents after feeling bad after knee injections today.  Had her left knee injected that she has had done numerous times before.  States she was feeling fine with the injection but while the [place] began to feel short of breath lightheaded feel her heart pounding and states she got very sweaty.  Feeling somewhat better now.  States she has been a little more fatigued and is getting night sweats.  No abdominal pain.  No cough.  Has a known ascending aortic aneurysm and history of CLL.  Feeling better now.  Somewhat nervous about what happened however.    Past Medical History:  Diagnosis Date   Abnormal nuclear stress test 01/26/2009   POST EXERCISE PVC's NOTED SINCE 2003   CLL (chronic lymphocytic leukemia) ([place]) 10/2009   DR. KALE   Colon polyp    HYPERPLASTIC   Diverticulosis    DJD (degenerative joint disease)    GERD (gastroesophageal reflux disease)    Hearing loss    DR. SHOEMAKER   Hypercholesterolemia    Hyperlipidemia    Iron deficiency anemia 05/2010   Lesion of breast    LEFT, SMALL   Microscopic hematuria    DR. PETERSON   Neuropathy    PERIPHERAL   Osteopenia    Renal cyst 10/2009   RENAL CYST ON CT 11/2009   Renal disease    STAGE 3   Tendon cysts    EXTENSOR 2MM RIGHT FIRST FINGER   Vitamin D deficiency 10/2011    Patient Active Problem List   Diagnosis Date Noted   Peripheral neuropathy 02/06/2019   Osteopenia 02/06/2019   Hypercholesterolemia 02/06/2019   Hypercalcemia 02/06/2019   CLL (chronic lymphocytic leukemia) ([place]) 02/06/2019   Stage III chronic kidney disease 02/06/2019   Presbycusis of both ears 01/15/2018   Impacted cerumen of right ear 01/15/2018   Osteoarthritis  of knee 09/21/2017   CAD in native artery 07/08/2017   Ascending aortic aneurysm ([place]) 07/08/2017   Sensorineural hearing loss (SNHL), bilateral 12/22/2016   Angina pectoris ([place]) 12/25/2011    Class: Acute   GERD (gastroesophageal reflux disease) 12/25/2011    Past Surgical History:  Procedure Laterality Date   LEFT HEART CATHETERIZATION WITH CORONARY ANGIOGRAM N/A 12/25/2011   Procedure: LEFT HEART CATHETERIZATION WITH CORONARY ANGIOGRAM;  Surgeon: Sinclair Grooms, MD;  Location: [facility] CATH LAB;  Service: Cardiovascular;  Laterality: N/A;     OB History   No obstetric history on file.     Family History  Family history unknown: Yes    Social History   Tobacco Use   Smoking status: Former Smoker    Quit date: 06/25/1980    Years since quitting: 39.4   Smokeless tobacco: Never Used  Substance Use Topics   Alcohol use: Yes   Drug use: No    Home Medications Prior to Admission medications   Medication Sig Start Date End Date Taking? Authorizing Provider  acetaminophen (TYLENOL) 500 MG tablet Take 500 mg by mouth 2 (two) times daily.     [provider]  Ascorbic Acid (VITAMIN C PO) Take 1 tablet by mouth daily.    [provider]  aspirin 81 MG tablet Take 81 mg by mouth daily.  [provider]  Coenzyme Q10 (CO Q 10 PO) Take 1 tablet by mouth daily.    [provider]  fluorometholone (FML) 0.1 % ophthalmic suspension  01/24/19   [provider]  glucosamine-chondroitin 500-400 MG tablet Take 1 tablet 3 (three) times a week by mouth.    [provider]  metoprolol succinate (TOPROL-XL) 25 MG 24 hr tablet Take 0.5 tablets (12.5 mg total) by mouth daily. 07/09/17   Belva Crome, MD  Multiple Vitamin (MULITIVITAMIN WITH MINERALS) TABS Take 1 tablet by mouth daily.    [provider]  NITROSTAT 0.4 MG SL tablet DISSOLVE ONE TABLET UNDERTONGUE AS DIRECTED AS    NEEDED FOR CHEST PAIN Patient taking  differently: Place 0.4 mg under the tongue every 5 (five) minutes as needed for chest pain.  08/31/13   Belva Crome, MD  ondansetron (ZOFRAN ODT) 4 MG disintegrating tablet 4mg  ODT q4 hours prn nausea/vomit 08/06/18   Drenda Freeze, MD  polyethylene glycol [facility] / Floria Raveling) packet Take 17 g by mouth every other day.     [provider]  Probiotic Product (ALIGN PO) Take 1 capsule by mouth as needed.     [provider]  rosuvastatin (CRESTOR) 5 MG tablet Take 5 mg by mouth at bedtime.    [provider]  traMADol (ULTRAM) 50 MG tablet Take 50 mg by mouth 2 (two) times daily as needed for moderate pain.     [provider]    Allergies    Amoxicillin, Ciprofloxacin, Macrodantin [nitrofurantoin], and Sulfa antibiotics  Review of Systems   Review of Systems  Constitutional: Positive for diaphoresis, fatigue and unexpected weight change. Negative for appetite change.  HENT: Negative for congestion.   Respiratory: Positive for shortness of breath.   Cardiovascular: Positive for chest pain.  Gastrointestinal: Negative for abdominal distention.  Genitourinary: Negative for flank pain.  Musculoskeletal: Negative for back pain.  Skin: Negative for rash.  Neurological: Positive for weakness.  Psychiatric/Behavioral: Negative for confusion.    Physical Exam Updated Vital Signs BP (!) 155/75    Pulse 91    Temp 97.9 F (36.6 C) (Oral)    Resp 15    Ht 5\' 4"  (1.626 m)    Wt 69.9 kg    SpO2 98%    BMI 26.43 kg/m   Physical Exam Vitals and nursing note reviewed.  HENT:     Head: Normocephalic.  Eyes:     Extraocular Movements: Extraocular movements intact.  Cardiovascular:     Rate and Rhythm: Regular rhythm.  Pulmonary:     Breath sounds: No wheezing or rhonchi.  Abdominal:     General: There is distension.  Musculoskeletal:     Cervical back: Neck supple.     Right lower leg: No edema.     Left lower leg: No edema.     Comments: Band-Aid  in place on left knee.  No swelling.  Skin:    General: Skin is warm.     Capillary Refill: Capillary refill takes less than 2 seconds.  Neurological:     Mental Status: She is alert and oriented to person, place, and time.     ED Results / Procedures / Treatments   Labs (all labs ordered are listed, but only abnormal results are displayed) Labs Reviewed  BASIC METABOLIC PANEL - Abnormal; Notable for the following components:      Result Value   Creatinine, Ser 1.10 (*)    GFR calc  non Af Amer 44 (*)    GFR calc Af Amer 51 (*)    All other components within normal limits  CBC WITH DIFFERENTIAL/PLATELET - Abnormal; Notable for the following components:   WBC 11.6 (*)    RBC 3.81 (*)    Hemoglobin 11.7 (*)    Lymphs Abs 6.0 (*)    All other components within normal limits  TROPONIN I (HIGH SENSITIVITY)  TROPONIN I (HIGH SENSITIVITY)    EKG EKG Interpretation  Date/Time:  Wednesday November 12 2019 17:02:21 EDT Ventricular Rate:  78 PR Interval:    QRS Duration: 88 QT Interval:  376 QTC Calculation: 429 R Axis:   -19 Text Interpretation: Sinus rhythm Prolonged PR interval Borderline left axis deviation Confirmed by Davonna Belling [id number]) on 11/12/2019 5:22:27 PM   EKG Interpretation  Date/Time:  Wednesday November 12 2019 20:05:25 EDT Ventricular Rate:  104 PR Interval:    QRS Duration: 127 QT Interval:  369 QTC Calculation: 486 R Axis:   -44 Text Interpretation: Sinus tachycardia Left bundle branch block Confirmed by Davonna Belling [phone]) on 11/12/2019 8:22:43 PM        Radiology DG Chest Portable 1 View  Result Date: 11/12/2019 CLINICAL DATA:  Chest pain EXAM: PORTABLE CHEST 1 VIEW COMPARISON:  August 06, 2018. FINDINGS: Lungs are clear. Heart is borderline enlarged with pulmonary vascularity normal. No adenopathy. There is aortic atherosclerosis. Bones appear osteoporotic. IMPRESSION: Lungs clear. Heart borderline enlarged. Aortic Atherosclerosis  (ICD10-I70.0). Electronically Signed   By: Lowella Grip III M.D.   On: 11/12/2019 16:56   CT ANGIO CHEST AORTA W/CM & OR WO/CM  Result Date: 11/12/2019 CLINICAL DATA:  Nonspecific chest pain, thoracic aortic aneurysm EXAM: CT ANGIOGRAPHY CHEST WITH CONTRAST TECHNIQUE: Multidetector CT imaging of the chest was performed using the standard protocol during bolus administration of intravenous contrast. Multiplanar CT image reconstructions and MIPs were obtained to evaluate the vascular anatomy. CONTRAST:  5mL OMNIPAQUE IOHEXOL 350 MG/ML SOLN COMPARISON:  02/06/2019, 11/12/2018 FINDINGS: Cardiovascular: This is a technically adequate evaluation of the thoracic aorta. 4.1 x 4.3 cm ascending thoracic aortic aneurysm is unchanged. No dissection. Moderate atherosclerosis of the aortic arch. The heart is not enlarged.  No pericardial effusion. While not optimized for opacification of the pulmonary vasculature, there is sufficient contrast enhancement to exclude pulmonary emboli. Mediastinum/Nodes: No enlarged mediastinal, hilar, or axillary lymph nodes. Thyroid gland, trachea, and esophagus demonstrate no significant findings. Lungs/Pleura: No acute pleural or parenchymal lung disease. Central airways are patent. Upper Abdomen: Numerous right renal cysts.  No acute abnormalities. Musculoskeletal: No acute displaced fractures. Reconstructed images demonstrate no additional findings. Review of the MIP images confirms the above findings. IMPRESSION: 1. Stable 4.3 cm ascending thoracic aortic aneurysm. Recommend annual imaging followup by CTA or MRA. This recommendation follows 2010 ACCF/AHA/AATS/ACR/ASA/SCA/SCAI/SIR/STS/SVM Guidelines for the Diagnosis and Management of Patients with Thoracic Aortic Disease. Circulation. 2010; [id number]. Aortic aneurysm NOS (ICD10-I71.9) 2. No evidence of pulmonary embolus. 3. No acute intrathoracic process. 4.  Aortic Atherosclerosis (ICD10-I70.0). Electronically Signed   By:  Randa Ngo M.D.   On: 11/12/2019 19:35    Procedures Procedures (including critical care time)  Medications Ordered in ED Medications  sodium chloride (PF) 0.9 % injection (  Given by Other 11/12/19 1924)  iohexol (OMNIPAQUE) 350 MG/ML injection 100 mL (80 mLs Intravenous Contrast Given 11/12/19 1905)    ED Course  I have reviewed the triage vital signs and the nursing notes.  Pertinent labs & imaging results that were  available during my care of the patient were reviewed by me and considered in my medical decision making (see chart for details).    MDM Rules/Calculators/A&P                      Patient presented with a near syncopal episode after knee injection.  Was diaphoretic and had some chest pain and feels her heart beating hard.  Troponin negative x2.  Blood work reassuring.  Initial EKG sinus rhythm later had left bundle branch block sometimes in bigeminy sometimes more just bundle branch block.  Patient feels better.  Will discharge home and have follow-up with her cardiologist as needed Final Clinical Impression(s) / ED Diagnoses Final diagnoses:  Near syncope    Rx / DC Orders ED Discharge Orders    None       Davonna Belling, MD 11/12/19 2023

## 2019-11-14 NOTE — Progress Notes (Signed)
CARDIOLOGY OFFICE NOTE  Date:  11/17/2019    [name] Date of Birth: 09-26-1925 Medical Record [id number]  PCP:  [name], MD  Cardiologist:  Tamala Julian    Chief Complaint  Patient presents with  . Hospitalization Follow-up    Seen for Dr. Tamala Julian    History of Present Illness: Rebecca King is a 86 y.o. female who presents today for a post ER visit. Seen for Dr. Tamala Julian.   She has a history of angina and prior cath has shown 70 to 80% ostial diagonal disease from 2018 - details of specifics not known - she believes this may have been prior to coming back to [place] from [facility]. Other issues include dilated aorta and CLL (Dr. Irene Limbo).   She has not been seen here since 2018. That was following an ER visit for chest pain. CT at that time showed mild calcification and ascending aortic aneurysm of 4.2.   Last week, she presented to the ER following a near syncopal episode after knee injection. Was diaphoretic and had some chest pain and felt her heart beating hard.  Troponin negative x2.  Blood work reassuring.  Initial EKG sinus rhythm later had left bundle branch block sometimes in bigeminy sometimes more just bundle branch block.  Was discharged to see cardiology.   The patient does not have symptoms concerning for COVID-19 infection (fever, chills, cough, or new shortness of breath). She has had both vaccines. Her last vaccine was about a week ago.   Comes in today. Here alone. Numerous issues today. She had a cortisone shot on Friday - has had this in the past without issue - about 30 minutes later - had diaphoresis/weakness and chest pain. This was her first injection in about a year - she was trying to hold off due to the [place]. Unclear to me if there was epinephrine in this.  She was anxious. She notes some occasional chest heaviness - she thinks this is allergies. She notes that she had a butterfly rash on her face last week - along with some other symptoms - she is  wondering if her GI tract is an issue. Still having some headaches and sweating at night when in bed. She will still have some chest pain - "like someone is sitting on her chest". She has had some chronic GI issues - "just indigestion" - but seems to be "more than that". She feels like several things are going on. BP had been running high until today. She notes her heart beats a little fast - comes and goes. She never started the Toprol - says she is pretty "stoic" - did not think she would need. Her aorta is 4.1 x 4.3 when checked last week - this was stable. Moderate atherosclerosis of the aortic arch noted. She had fever a few days ago. She has had elevated WBC - has CLL.   Past Medical History:  Diagnosis Date  . Abnormal nuclear stress test 01/26/2009   POST EXERCISE PVC's NOTED SINCE 2003  . CLL (chronic lymphocytic leukemia) ([place]) 10/2009   DR. KALE  . Colon polyp    HYPERPLASTIC  . Diverticulosis   . DJD (degenerative joint disease)   . GERD (gastroesophageal reflux disease)   . Hearing loss    DR. SHOEMAKER  . Hypercholesterolemia   . Hyperlipidemia   . Iron deficiency anemia 05/2010  . Lesion of breast    LEFT, SMALL  . Microscopic hematuria    DR.  PETERSON  . Neuropathy    PERIPHERAL  . Osteopenia   . Renal cyst 10/2009   RENAL CYST ON CT 11/2009  . Renal disease    STAGE 3  . Tendon cysts    EXTENSOR 2MM RIGHT FIRST FINGER  . Vitamin D deficiency 10/2011    Past Surgical History:  Procedure Laterality Date  . LEFT HEART CATHETERIZATION WITH CORONARY ANGIOGRAM N/A 12/25/2011   Procedure: LEFT HEART CATHETERIZATION WITH CORONARY ANGIOGRAM;  Surgeon: Sinclair Grooms, MD;  Location: [facility] CATH LAB;  Service: Cardiovascular;  Laterality: N/A;     Medications: Current Meds  Medication Sig  . acetaminophen (TYLENOL) 500 MG tablet Take 500 mg by mouth 2 (two) times daily.   . Coenzyme Q10 (CO Q 10 PO) Take 1 tablet by mouth daily.  . Multiple Vitamin (MULITIVITAMIN WITH  MINERALS) TABS Take 1 tablet by mouth daily.  Marland Kitchen NITROSTAT 0.4 MG SL tablet DISSOLVE ONE TABLET UNDERTONGUE AS DIRECTED AS    NEEDED FOR CHEST PAIN  . ondansetron (ZOFRAN ODT) 4 MG disintegrating tablet 4mg  ODT q4 hours prn nausea/vomit  . polyethylene glycol (MIRALAX / GLYCOLAX) packet Take 17 g by mouth every other day.   . Probiotic Product (ALIGN PO) Take 1 capsule by mouth as needed.   . rosuvastatin (CRESTOR) 5 MG tablet Take 5 mg by mouth at bedtime.  . traMADol (ULTRAM) 50 MG tablet Take 50 mg by mouth 2 (two) times daily as needed for moderate pain.   . [DISCONTINUED] Ascorbic Acid (VITAMIN C PO) Take 1 tablet by mouth daily.     Allergies: Allergies  Allergen Reactions  . Amoxicillin Other (See Comments)    HEADACHES  . Ciprofloxacin Nausea Only and Rash    And tingling lips and itching  . Macrodantin [Nitrofurantoin] Rash  . Sulfa Antibiotics Nausea Only and Rash    And tingling lips and itching    Social History: The patient  reports that she quit smoking about 39 years ago. She has never used smokeless tobacco. She reports current alcohol use. She reports that she does not use drugs.   Family History: The patient's Family history is unknown by patient.   Review of Systems: Please see the history of present illness.   All other systems are reviewed and negative.   Physical Exam: VS:  BP 120/80   Pulse 86   Ht 5\' 4"  (1.626 m)   Wt 155 lb 6.4 oz (70.5 kg)   SpO2 98%   BMI 26.67 kg/m  .  BMI Body mass index is 26.67 kg/m.  Wt Readings from Last 3 Encounters:  11/17/19 155 lb 6.4 oz (70.5 kg)  11/12/19 154 lb (69.9 kg)  10/21/19 160 lb 6.4 oz (72.8 kg)    General: Elderly. Alert and in no acute distress.   HEENT: Normal.  Neck: Supple, no JVD, carotid bruits, or masses noted.  Cardiac: Regular rate and rhythm. But rate is fast.  No edema.  Respiratory:  Lungs are clear to auscultation bilaterally with normal work of breathing.  GI: Soft and nontender.  MS:  No deformity or atrophy. Gait and ROM intact.  Skin: Warm and dry. Color is normal.  Neuro:  Strength and sensation are intact and no gross focal deficits noted.  Psych: Alert, appropriate and with normal affect.   LABORATORY DATA:  EKG:  EKG is not ordered today.  Personally reviewed by me. This demonstrates probable AIVR - rate of 99 - reviewed with Dr. Harrington Challenger here.  Prior tracings with lots of ectopy noted as well.   Lab Results  Component Value Date   WBC 11.6 (H) 11/12/2019   HGB 11.7 (L) 11/12/2019   HCT 36.4 11/12/2019   PLT 292 11/12/2019   GLUCOSE 93 11/12/2019   ALT 11 10/21/2019   AST 21 10/21/2019   NA 142 11/12/2019   K 4.0 11/12/2019   CL 108 11/12/2019   CREATININE 1.10 (H) 11/12/2019   BUN 19 11/12/2019   CO2 26 11/12/2019   INR 1.65 (H) 04/14/2010        BNP (last 3 results) No results for input(s): BNP in the last 8760 hours.  ProBNP (last 3 results) No results for input(s): PROBNP in the last 8760 hours.   Other Studies Reviewed Today:  Echo Study Conclusions 07/2017  - Left ventricle: The cavity size was normal. Wall thickness was  increased in a pattern of mild LVH. The estimated ejection  fraction was 55%. Doppler parameters are consistent with abnormal  left ventricular relaxation (grade 1 diastolic dysfunction).  - Mitral valve: Calcification of the anterior leaflet There was  mild regurgitation.  - Atrial septum: No defect or patent foramen ovale was identified.    ASSESSMENT & PLAN:    1. Pre syncope/chest pain/shortness of breath/fever/weakness - discussed with Dr. Harrington Challenger - she was not COVID tested - will send her for this today. Adding back Toprol (that she never took from last visit here). See back with EKG. This started after a cortisone injection - ?if there was any epi in this injection.   2. AIVR - most likely on today's EKG - she will restart Toprol at 12.5 mg a day after discussion with Dr. Harrington Challenger. Will need EKG on return.    3. Thoracic aortic aneurysm - most recent scan noted  4. Known CAD - seems to not remember this but acknowledges a plan for conservative approach - adding back beta blocker today.   5.  CLL - no on active therapy - follows with Dr. Irene Limbo.   6. HLD - on statin therapy  7. COVID-19 Education: The signs and symptoms of COVID-19 were discussed with the patient and how to seek care for testing (follow up with PCP or arrange E-visit).  The importance of social distancing, staying at home, hand hygiene and wearing a mask when out in public were discussed today. She has had both vaccines - last was approximately one week ago.   Current medicines are reviewed with the patient today.  The patient does not have concerns regarding medicines other than what has been noted above.  The following changes have been made:  See above.  Labs/ tests ordered today include:   No orders of the defined types were placed in this encounter.    Disposition:   FU with [place] in a month with repeat EKG   Patient is agreeable to this plan and will call if any problems develop in the interim.   SignedTruitt Merle, NP  11/17/2019 11:02 AM  [place] [address] [place], [place]  [address] Phone: [phone] Fax: [phone]

## 2019-11-17 ENCOUNTER — Ambulatory Visit (INDEPENDENT_AMBULATORY_CARE_PROVIDER_SITE_OTHER): Payer: Medicare Other | Admitting: Nurse Practitioner

## 2019-11-17 ENCOUNTER — Ambulatory Visit: Payer: Medicare Other | Attending: Internal Medicine

## 2019-11-17 ENCOUNTER — Other Ambulatory Visit: Payer: Self-pay

## 2019-11-17 ENCOUNTER — Encounter: Payer: Self-pay | Admitting: Nurse Practitioner

## 2019-11-17 VITALS — BP 120/80 | HR 86 | Ht 64.0 in | Wt 155.4 lb

## 2019-11-17 DIAGNOSIS — C9110 Chronic lymphocytic leukemia of B-cell type not having achieved remission: Secondary | ICD-10-CM | POA: Diagnosis not present

## 2019-11-17 DIAGNOSIS — Z7189 Other specified counseling: Secondary | ICD-10-CM

## 2019-11-17 DIAGNOSIS — R079 Chest pain, unspecified: Secondary | ICD-10-CM

## 2019-11-17 DIAGNOSIS — K30 Functional dyspepsia: Secondary | ICD-10-CM

## 2019-11-17 DIAGNOSIS — I442 Atrioventricular block, complete: Secondary | ICD-10-CM | POA: Diagnosis not present

## 2019-11-17 DIAGNOSIS — R509 Fever, unspecified: Secondary | ICD-10-CM | POA: Diagnosis not present

## 2019-11-17 DIAGNOSIS — Z20822 Contact with and (suspected) exposure to covid-19: Secondary | ICD-10-CM | POA: Diagnosis not present

## 2019-11-17 DIAGNOSIS — I259 Chronic ischemic heart disease, unspecified: Secondary | ICD-10-CM

## 2019-11-17 MED ORDER — METOPROLOL SUCCINATE ER 25 MG PO TB24: 12.5000 mg | ORAL_TABLET | Freq: Every day | ORAL | 6 refills | Status: DC

## 2019-11-17 NOTE — Patient Instructions (Addendum)
After Visit Summary:  We will be checking the following labs today - NONE   Medication Instructions:    Continue with your current medicines. BUT  We are restarting the Toprol 25 mg to take just 1/2 tablet once a day - start this today.    If you need a refill on your cardiac medications before your next appointment, please call your pharmacy.     Testing/Procedures To Be Arranged:   COVID test  Follow-Up:   See     At [facility], you and your health needs are our priority.  As part of our continuing mission to provide you with exceptional heart care, we have created designated Provider Care Teams.  These Care Teams include your primary Cardiologist (physician) and Advanced Practice Providers (APPs -  Physician Assistants and Nurse Practitioners) who all work together to provide you with the care you need, when you need it.  Special Instructions:  . Stay safe, stay home, wash your hands for at least 20 seconds and wear a mask when out in public.  . It was good to talk with you today.    Call the [place] office at [phone] if you have any questions, problems or concerns.

## 2019-11-19 LAB — NOVEL CORONAVIRUS, NAA: SARS-CoV-2, NAA: NOT DETECTED

## 2019-11-19 LAB — SARS-COV-2, NAA 2 DAY TAT

## 2019-11-20 ENCOUNTER — Telehealth: Payer: Self-pay | Admitting: Geriatric Medicine

## 2019-11-20 NOTE — Telephone Encounter (Signed)
Patient husband called and received her negative covid test result

## 2019-11-25 DIAGNOSIS — E7800 Pure hypercholesterolemia, unspecified: Secondary | ICD-10-CM | POA: Diagnosis not present

## 2019-11-25 DIAGNOSIS — J301 Allergic rhinitis due to pollen: Secondary | ICD-10-CM | POA: Diagnosis not present

## 2019-11-25 DIAGNOSIS — I700 Atherosclerosis of aorta: Secondary | ICD-10-CM | POA: Diagnosis not present

## 2019-11-25 DIAGNOSIS — C9110 Chronic lymphocytic leukemia of B-cell type not having achieved remission: Secondary | ICD-10-CM | POA: Diagnosis not present

## 2019-11-25 DIAGNOSIS — R413 Other amnesia: Secondary | ICD-10-CM | POA: Diagnosis not present

## 2019-11-25 DIAGNOSIS — Z79899 Other long term (current) drug therapy: Secondary | ICD-10-CM | POA: Diagnosis not present

## 2019-11-25 DIAGNOSIS — Z7189 Other specified counseling: Secondary | ICD-10-CM | POA: Diagnosis not present

## 2019-11-25 DIAGNOSIS — I712 Thoracic aortic aneurysm, without rupture: Secondary | ICD-10-CM | POA: Diagnosis not present

## 2019-11-25 DIAGNOSIS — K219 Gastro-esophageal reflux disease without esophagitis: Secondary | ICD-10-CM | POA: Diagnosis not present

## 2019-11-25 NOTE — Addendum Note (Signed)
Addended by: Lanna Poche R on: 11/25/2019 05:07 PM   Modules accepted: Orders

## 2019-12-14 NOTE — Progress Notes (Signed)
Cardiology Office Note:    Date:  12/15/2019   ID:  Rebecca King, DOB 11-05-1925, MRN [id number]  PCP:  [name], MD  Cardiologist:  Sinclair Grooms, MD   Referring MD: [name], MD   Chief Complaint  Patient presents with  . Coronary Artery Disease  . Shortness of Breath  . Chest Pain    History of Present Illness:    Rebecca King is a 86 y.o. female with a angina pectoris and prior cath demonstrating 70-80% ostial diagonal 2013,chest CT with dillated aorta (aneurysm), AIVR., and recent near syncope after injection(Pre syncope/chest pain/shortness of breath/fever/weakness).  Having some dyspnea and fatigue.  Had an episode of near syncope with chest pain and shortness of breath and weakness.  No positive findings during the emergency room visit.  No recurrence of similar symptoms.  The episode occurred after she had an injection from an orthopedist.  She denies orthopnea, PND, lower extremity swelling.  Past Medical History:  Diagnosis Date  . Abnormal nuclear stress test 01/26/2009   POST EXERCISE PVC's NOTED SINCE 2003  . CLL (chronic lymphocytic leukemia) ([place]) 10/2009   DR. KALE  . Colon polyp    HYPERPLASTIC  . Diverticulosis   . DJD (degenerative joint disease)   . GERD (gastroesophageal reflux disease)   . Hearing loss    DR. SHOEMAKER  . Hypercholesterolemia   . Hyperlipidemia   . Iron deficiency anemia 05/2010  . Lesion of breast    LEFT, SMALL  . Microscopic hematuria    DR. PETERSON  . Neuropathy    PERIPHERAL  . Osteopenia   . Renal cyst 10/2009   RENAL CYST ON CT 11/2009  . Renal disease    STAGE 3  . Tendon cysts    EXTENSOR 2MM RIGHT FIRST FINGER  . Vitamin D deficiency 10/2011    Past Surgical History:  Procedure Laterality Date  . LEFT HEART CATHETERIZATION WITH CORONARY ANGIOGRAM N/A 12/25/2011   Procedure: LEFT HEART CATHETERIZATION WITH CORONARY ANGIOGRAM;  Surgeon: Sinclair Grooms, MD;  Location: [facility] CATH LAB;  Service:  Cardiovascular;  Laterality: N/A;    Current Medications: Current Meds  Medication Sig  . acetaminophen (TYLENOL) 500 MG tablet Take 500 mg by mouth 2 (two) times daily.   Marland Kitchen aspirin 81 MG tablet Take 81 mg by mouth daily.  . Coenzyme Q10 (CO Q 10 PO) Take 1 tablet by mouth daily.  . metoprolol succinate (TOPROL XL) 25 MG 24 hr tablet Take 0.5 tablets (12.5 mg total) by mouth daily.  . Multiple Vitamin (MULITIVITAMIN WITH MINERALS) TABS Take 1 tablet by mouth daily.  Marland Kitchen NITROSTAT 0.4 MG SL tablet DISSOLVE ONE TABLET UNDERTONGUE AS DIRECTED AS    NEEDED FOR CHEST PAIN  . ondansetron (ZOFRAN ODT) 4 MG disintegrating tablet 4mg  ODT q4 hours prn nausea/vomit  . polyethylene glycol (MIRALAX / GLYCOLAX) packet Take 17 g by mouth every other day.   . Probiotic Product (ALIGN PO) Take 1 capsule by mouth as needed.   . rosuvastatin (CRESTOR) 5 MG tablet Take 5 mg by mouth at bedtime.  . traMADol (ULTRAM) 50 MG tablet Take 50 mg by mouth 2 (two) times daily as needed for moderate pain.      Allergies:   Amoxicillin, Ciprofloxacin, Macrodantin [nitrofurantoin], and Sulfa antibiotics   Social History   Socioeconomic History  . Marital status: Married    Spouse name: Not on file  . Number of children: Not on file  .  Years of education: Not on file  . Highest education level: Not on file  Occupational History  . Occupation: UNEMPLOYED  Tobacco Use  . Smoking status: Former Smoker    Quit date: 06/25/1980    Years since quitting: 39.4  . Smokeless tobacco: Never Used  Substance and Sexual Activity  . Alcohol use: Yes  . Drug use: No  . Sexual activity: Not on file  Other Topics Concern  . Not on file  Social History Narrative  . Not on file   Social Determinants of Health   Financial Resource Strain:   . Difficulty of Paying Living Expenses:   Food Insecurity:   . Worried About Charity fundraiser in the Last Year:   . Arboriculturist in the Last Year:   Transportation Needs:   .  Film/video editor (Medical):   Marland Kitchen Lack of Transportation (Non-Medical):   Physical Activity:   . Days of Exercise per Week:   . Minutes of Exercise per Session:   Stress:   . Feeling of Stress :   Social Connections:   . Frequency of Communication with Friends and Family:   . Frequency of Social Gatherings with Friends and Family:   . Attends Religious Services:   . Active Member of Clubs or Organizations:   . Attends Archivist Meetings:   Marland Kitchen Marital Status:      Family History: The patient's Family history is unknown by patient.  ROS:   Please see the history of present illness.    Decreased memory.  Sleeps more than 10 hours a day.  Sedentary.  All other systems reviewed and are negative.  EKGs/Labs/Other Studies Reviewed:    The following studies were reviewed today: No new data  EKG:  EKG normal sinus rhythm with nonspecific T wave flattening  Recent Labs: 10/21/2019: ALT 11 11/12/2019: BUN 19; Creatinine, Ser 1.10; Hemoglobin 11.7; Platelets 292; Potassium 4.0; Sodium 142  Recent Lipid Panel No results found for: CHOL, TRIG, HDL, CHOLHDL, VLDL, LDLCALC, LDLDIRECT  Physical Exam:    VS:  BP 122/80   Pulse 69   Ht 5\' 3"  (1.6 m)   Wt 158 lb 3.2 oz (71.8 kg)   BMI 28.02 kg/m     Wt Readings from Last 3 Encounters:  12/15/19 158 lb 3.2 oz (71.8 kg)  11/17/19 155 lb 6.4 oz (70.5 kg)  11/12/19 154 lb (69.9 kg)     GEN: Appears younger than stated age. No acute distress HEENT: Normal NECK: No JVD. LYMPHATICS: No lymphadenopathy CARDIAC:  RRR without murmur, gallop, or edema. VASCULAR:  Normal Pulses. No bruits. RESPIRATORY:  Clear to auscultation without rales, wheezing or rhonchi  ABDOMEN: Soft, non-tender, non-distended, No pulsatile mass, MUSCULOSKELETAL: No deformity  SKIN: Warm and dry NEUROLOGIC:  Alert and oriented x 3 PSYCHIATRIC:  Normal affect   ASSESSMENT:    1. Ischemic heart disease   2. Chest pain of uncertain etiology   3.  AIVR (accelerated idioventricular rhythm) ([place])   4. CLL (chronic lymphocytic leukemia) ([place])   5. Educated about COVID-19 virus infection    PLAN:    In order of problems listed above:  1. It is difficult to tell if the symptoms around the emergency room visit were ischemic.  She certainly has had no change in EKG appearance and no recurrence of similar symptoms.  Plan clinical observation. 2. Continue Toprol-XL 12.5 mg/day.  Notify us of increased symptoms.  Secondary prevention discussed.  30 minutes  of moderate activity 5 out of 7 days as discussed. 3. Not discussed 4. Not discussed 5. COVID-19 vaccine and social distancing is discussed.  Overall education and awareness concerning primary/secondary risk prevention was discussed in detail: LDL less than 70, hemoglobin A1c less than 7, blood pressure target less than 130/80 mmHg, >150 minutes of moderate aerobic activity per week, avoidance of smoking, weight control (via diet and exercise), and continued surveillance/management of/for obstructive sleep apnea.     Medication Adjustments/Labs and Tests Ordered: Current medicines are reviewed at length with the patient today.  Concerns regarding medicines are outlined above.  No orders of the defined types were placed in this encounter.  No orders of the defined types were placed in this encounter.   Patient Instructions  Medication Instructions:  Your physician recommends that you continue on your current medications as directed. Please refer to the Current Medication list given to you today.  *If you need a refill on your cardiac medications before your next appointment, please call your pharmacy*   Lab Work: None If you have labs (blood work) drawn today and your tests are completely normal, you will receive your results only by: Marland Kitchen MyChart Message (if you have MyChart) OR . A paper copy in the mail If you have any lab test that is abnormal or we need to change your treatment,  we will call you to review the results.   Testing/Procedures: None   Follow-Up: At [facility], you and your health needs are our priority.  As part of our continuing mission to provide you with exceptional heart care, we have created designated Provider Care Teams.  These Care Teams include your primary Cardiologist (physician) and Advanced Practice Providers (APPs -  Physician Assistants and Nurse Practitioners) who all work together to provide you with the care you need, when you need it.  We recommend signing up for the patient portal called "MyChart".  Sign up information is provided on this After Visit Summary.  MyChart is used to connect with patients for Virtual Visits (Telemedicine).  Patients are able to view lab/test results, encounter notes, upcoming appointments, etc.  Non-urgent messages can be sent to your provider as well.   To learn more about what you can do with MyChart, go to [email or web address].    Your next appointment:   As needed  The format for your next appointment:   In Person  Provider:   You may see Sinclair Grooms, MD or one of the following Advanced Practice Providers on your designated Care Team:    Truitt Merle, NP  Cecilie Kicks, NP  Kathyrn Drown, NP    Other Instructions      Signed, Sinclair Grooms, MD  12/15/2019 3:26 PM    [place]

## 2019-12-15 ENCOUNTER — Ambulatory Visit (INDEPENDENT_AMBULATORY_CARE_PROVIDER_SITE_OTHER): Payer: Medicare Other | Admitting: Interventional Cardiology

## 2019-12-15 ENCOUNTER — Encounter: Payer: Self-pay | Admitting: Interventional Cardiology

## 2019-12-15 ENCOUNTER — Other Ambulatory Visit: Payer: Self-pay

## 2019-12-15 VITALS — BP 122/80 | HR 69 | Ht 63.0 in | Wt 158.2 lb

## 2019-12-15 DIAGNOSIS — Z7189 Other specified counseling: Secondary | ICD-10-CM

## 2019-12-15 DIAGNOSIS — I259 Chronic ischemic heart disease, unspecified: Secondary | ICD-10-CM

## 2019-12-15 DIAGNOSIS — I442 Atrioventricular block, complete: Secondary | ICD-10-CM | POA: Diagnosis not present

## 2019-12-15 DIAGNOSIS — C9110 Chronic lymphocytic leukemia of B-cell type not having achieved remission: Secondary | ICD-10-CM | POA: Diagnosis not present

## 2019-12-15 DIAGNOSIS — R079 Chest pain, unspecified: Secondary | ICD-10-CM

## 2019-12-15 NOTE — Patient Instructions (Signed)
Medication Instructions:  Your physician recommends that you continue on your current medications as directed. Please refer to the Current Medication list given to you today.  *If you need a refill on your cardiac medications before your next appointment, please call your pharmacy*   Lab Work: None If you have labs (blood work) drawn today and your tests are completely normal, you will receive your results only by: . MyChart Message (if you have MyChart) OR . A paper copy in the mail If you have any lab test that is abnormal or we need to change your treatment, we will call you to review the results.   Testing/Procedures: None   Follow-Up: At [facility], you and your health needs are our priority.  As part of our continuing mission to provide you with exceptional heart care, we have created designated Provider Care Teams.  These Care Teams include your primary Cardiologist (physician) and Advanced Practice Providers (APPs -  Physician Assistants and Nurse Practitioners) who all work together to provide you with the care you need, when you need it.  We recommend signing up for the patient portal called "MyChart".  Sign up information is provided on this After Visit Summary.  MyChart is used to connect with patients for Virtual Visits (Telemedicine).  Patients are able to view lab/test results, encounter notes, upcoming appointments, etc.  Non-urgent messages can be sent to your provider as well.   To learn more about what you can do with MyChart, go to [email or web address].    Your next appointment:   As needed  The format for your next appointment:   In Person  Provider:   You may see Henry W Smith III, MD or one of the following Advanced Practice Providers on your designated Care Team:    Lori Gerhardt, NP  Laura Ingold, NP  Jill McDaniel, NP    Other Instructions   

## 2019-12-17 NOTE — Addendum Note (Signed)
Addended by: Loren Racer on: 12/17/2019 08:02 AM   Modules accepted: Orders

## 2020-01-28 ENCOUNTER — Other Ambulatory Visit: Payer: Self-pay | Admitting: Geriatric Medicine

## 2020-01-28 DIAGNOSIS — I712 Thoracic aortic aneurysm, without rupture, unspecified: Secondary | ICD-10-CM

## 2020-02-12 ENCOUNTER — Other Ambulatory Visit: Payer: Medicare Other

## 2020-02-20 ENCOUNTER — Inpatient Hospital Stay: Admission: RE | Admit: 2020-02-20 | Payer: Medicare Other | Source: Ambulatory Visit

## 2020-02-26 ENCOUNTER — Emergency Department (HOSPITAL_COMMUNITY): Payer: Medicare Other

## 2020-02-26 ENCOUNTER — Other Ambulatory Visit: Payer: Self-pay

## 2020-02-26 ENCOUNTER — Telehealth: Payer: Self-pay | Admitting: Interventional Cardiology

## 2020-02-26 ENCOUNTER — Encounter (HOSPITAL_COMMUNITY): Payer: Self-pay | Admitting: Emergency Medicine

## 2020-02-26 ENCOUNTER — Emergency Department (HOSPITAL_COMMUNITY)
Admission: EM | Admit: 2020-02-26 | Discharge: 2020-02-26 | Disposition: A | Discharge status: Home / Self Care | Payer: Medicare Other | Attending: Emergency Medicine | Admitting: Emergency Medicine

## 2020-02-26 DIAGNOSIS — H539 Unspecified visual disturbance: Secondary | ICD-10-CM | POA: Diagnosis not present

## 2020-02-26 DIAGNOSIS — R079 Chest pain, unspecified: Secondary | ICD-10-CM | POA: Insufficient documentation

## 2020-02-26 DIAGNOSIS — R55 Syncope and collapse: Secondary | ICD-10-CM | POA: Diagnosis not present

## 2020-02-26 DIAGNOSIS — Z87891 Personal history of nicotine dependence: Secondary | ICD-10-CM | POA: Diagnosis not present

## 2020-02-26 DIAGNOSIS — N183 Chronic kidney disease, stage 3 unspecified: Secondary | ICD-10-CM | POA: Diagnosis not present

## 2020-02-26 DIAGNOSIS — I25119 Atherosclerotic heart disease of native coronary artery with unspecified angina pectoris: Secondary | ICD-10-CM | POA: Diagnosis not present

## 2020-02-26 DIAGNOSIS — R0789 Other chest pain: Secondary | ICD-10-CM | POA: Diagnosis not present

## 2020-02-26 DIAGNOSIS — K5900 Constipation, unspecified: Secondary | ICD-10-CM | POA: Insufficient documentation

## 2020-02-26 DIAGNOSIS — R001 Bradycardia, unspecified: Secondary | ICD-10-CM | POA: Diagnosis not present

## 2020-02-26 DIAGNOSIS — I959 Hypotension, unspecified: Secondary | ICD-10-CM | POA: Diagnosis not present

## 2020-02-26 DIAGNOSIS — R42 Dizziness and giddiness: Secondary | ICD-10-CM | POA: Diagnosis not present

## 2020-02-26 DIAGNOSIS — R609 Edema, unspecified: Secondary | ICD-10-CM | POA: Diagnosis not present

## 2020-02-26 DIAGNOSIS — Z79899 Other long term (current) drug therapy: Secondary | ICD-10-CM | POA: Insufficient documentation

## 2020-02-26 DIAGNOSIS — R110 Nausea: Secondary | ICD-10-CM | POA: Diagnosis not present

## 2020-02-26 LAB — CBC
HCT: 36.6 % (ref 36.0–46.0)
Hemoglobin: 11.9 g/dL — ABNORMAL LOW (ref 12.0–15.0)
MCH: 30.7 pg (ref 26.0–34.0)
MCHC: 32.5 g/dL (ref 30.0–36.0)
MCV: 94.6 fL (ref 80.0–100.0)
Platelets: 305 10*3/uL (ref 150–400)
RBC: 3.87 MIL/uL (ref 3.87–5.11)
RDW: 14.4 % (ref 11.5–15.5)
WBC: 16.1 10*3/uL — ABNORMAL HIGH (ref 4.0–10.5)
nRBC: 0 % (ref 0.0–0.2)

## 2020-02-26 LAB — BASIC METABOLIC PANEL
Anion gap: 10 (ref 5–15)
BUN: 19 mg/dL (ref 8–23)
CO2: 25 mmol/L (ref 22–32)
Calcium: 10.1 mg/dL (ref 8.9–10.3)
Chloride: 102 mmol/L (ref 98–111)
Creatinine, Ser: 1.04 mg/dL — ABNORMAL HIGH (ref 0.44–1.00)
GFR calc Af Amer: 54 mL/min — ABNORMAL LOW (ref >60)
GFR calc non Af Amer: 47 mL/min — ABNORMAL LOW (ref >60)
Glucose, Bld: 95 mg/dL (ref 70–99)
Potassium: 4.5 mmol/L (ref 3.5–5.1)
Sodium: 137 mmol/L (ref 135–145)

## 2020-02-26 LAB — TROPONIN I (HIGH SENSITIVITY)
Troponin I (High Sensitivity): 5 ng/L (ref <18)
Troponin I (High Sensitivity): 5 ng/L (ref <18)

## 2020-02-26 MED ORDER — SODIUM CHLORIDE 0.9% FLUSH: 3.0000 mL | Freq: Once | INTRAVENOUS | Status: DC

## 2020-02-26 NOTE — Discharge Instructions (Signed)
Please follow up with PCP and Cardiologist in next few days. Recommend outpatient Echo in conjunction with evaluation by cardiologist.

## 2020-02-26 NOTE — Telephone Encounter (Signed)
Patient's call with sent in directly to triage. Nurse, Nira Conn from [facility] informed us that patient has been feeling dizzy this morning and almost passed out. Husband took her BP earlier this morning and it was 85/44. Now with patient laying down after breakfast her BP is 118/58 HR 60. Patient is still feeling dizzy and light headed and her BP drops when she tries to sit up. Patient is on Metoprolol 12.5 mg daily and her last dose was last night. Since patient almost passed out, encouraged patient to go to the ED to get evaluated. Nurse and patient verbalized understanding. Will forward to Dr. Tamala Julian and his nurse.

## 2020-02-26 NOTE — Telephone Encounter (Signed)
Pt c/o BP issue: STAT if pt c/o blurred vision, one-sided weakness or slurred speech  1. What are your last 5 BP readings? 85/44, 85/44, 118/58, 112/68  2. Are you having any other symptoms (ex. Dizziness, headache, blurred vision, passed out)? Dizziness, nausea, passing out, severe lightheadedness  3. What is your BP issue? Experiencing very low BP. Husband got help. Currently feeling lightheadedness

## 2020-02-26 NOTE — Telephone Encounter (Signed)
Agree with ED. Not on much meds that will lower BP. R/O bleeding, dehydration, and sepsis.

## 2020-02-26 NOTE — ED Provider Notes (Signed)
[facility] EMERGENCY DEPARTMENT Provider Note   CSN: [id number] Arrival date & time: 02/26/20  1111     History Chief Complaint  Patient presents with  . Dizziness  . Chest Pain    ILAH BOULE is a 86 y.o. female w history of HLD, CKD3, CAD, Aortic Aneurysm, GERD, who presents following pre-syncopal episode this am. Pt states shortly after finishing breakfast she had sharp non-radiating L-sided chest pain that was accompanied by HA, lightheadedness, nausea, SOB. Pt BP taken and found to be low. Pt states symptoms resolved on own and had normal BP after resting/laying down. Pt had recurrance following standing from reclined position. Pt endorses having lightheadedness at night/not feeling well for the past several months. Pt does endorse starting metoprolol nightly which she takes prior to nightly symptom onset. Pt endorses chest heaviness "person sitting on my chest" for the past few days. Has had rhinorrhea that pt believes is due to allergies. Denies CP, SOB at this time. Endorses chronic GI discomfort/constipation. Denies recent fever, dysuria/hematuria.  HPI     Past Medical History:  Diagnosis Date  . Abnormal nuclear stress test 01/26/2009   POST EXERCISE PVC's NOTED SINCE 2003  . CLL (chronic lymphocytic leukemia) ([place]) 10/2009   DR. KALE  . Colon polyp    HYPERPLASTIC  . Diverticulosis   . DJD (degenerative joint disease)   . GERD (gastroesophageal reflux disease)   . Hearing loss    DR. SHOEMAKER  . Hypercholesterolemia   . Hyperlipidemia   . Iron deficiency anemia 05/2010  . Lesion of breast    LEFT, SMALL  . Microscopic hematuria    DR. PETERSON  . Neuropathy    PERIPHERAL  . Osteopenia   . Renal cyst 10/2009   RENAL CYST ON CT 11/2009  . Renal disease    STAGE 3  . Tendon cysts    EXTENSOR 2MM RIGHT FIRST FINGER  . Vitamin D deficiency 10/2011    Patient Active Problem List   Diagnosis Date Noted  . Peripheral neuropathy  02/06/2019  . Osteopenia 02/06/2019  . Hypercholesterolemia 02/06/2019  . Hypercalcemia 02/06/2019  . CLL (chronic lymphocytic leukemia) ([place]) 02/06/2019  . Stage III chronic kidney disease 02/06/2019  . Presbycusis of both ears 01/15/2018  . Impacted cerumen of right ear 01/15/2018  . Osteoarthritis of knee 09/21/2017  . CAD in native artery 07/08/2017  . Ascending aortic aneurysm ([place]) 07/08/2017  . Sensorineural hearing loss (SNHL), bilateral 12/22/2016  . Angina pectoris ([place]) 12/25/2011    Class: Acute  . GERD (gastroesophageal reflux disease) 12/25/2011    Past Surgical History:  Procedure Laterality Date  . LEFT HEART CATHETERIZATION WITH CORONARY ANGIOGRAM N/A 12/25/2011   Procedure: LEFT HEART CATHETERIZATION WITH CORONARY ANGIOGRAM;  Surgeon: Sinclair Grooms, MD;  Location: [facility] CATH LAB;  Service: Cardiovascular;  Laterality: N/A;     OB History   No obstetric history on file.     Family History  Family history unknown: Yes    Social History   Tobacco Use  . Smoking status: Former Smoker    Quit date: 06/25/1980    Years since quitting: 39.7  . Smokeless tobacco: Never Used  Vaping Use  . Vaping Use: Never used  Substance Use Topics  . Alcohol use: Yes  . Drug use: No    Home Medications Prior to Admission medications   Medication Sig Start Date End Date Taking? Authorizing Provider  acetaminophen (TYLENOL) 500 MG tablet Take 500 mg  by mouth 2 (two) times daily.     [provider]  aspirin 81 MG tablet Take 81 mg by mouth daily.    [provider]  Coenzyme Q10 (CO Q 10 PO) Take 1 tablet by mouth daily.    [provider]  metoprolol succinate (TOPROL XL) 25 MG 24 hr tablet Take 0.5 tablets (12.5 mg total) by mouth daily. 11/17/19   Burtis Junes, NP  Multiple Vitamin (MULITIVITAMIN WITH MINERALS) TABS Take 1 tablet by mouth daily.    [provider]  NITROSTAT 0.4 MG SL tablet DISSOLVE ONE TABLET UNDERTONGUE AS  DIRECTED AS    NEEDED FOR CHEST PAIN 08/31/13   Belva Crome, MD  ondansetron (ZOFRAN ODT) 4 MG disintegrating tablet 4mg  ODT q4 hours prn nausea/vomit 08/06/18   Drenda Freeze, MD  polyethylene glycol [facility] / Floria Raveling) packet Take 17 g by mouth every other day.     [provider]  Probiotic Product (ALIGN PO) Take 1 capsule by mouth as needed.     [provider]  rosuvastatin (CRESTOR) 5 MG tablet Take 5 mg by mouth at bedtime.    [provider]  traMADol (ULTRAM) 50 MG tablet Take 50 mg by mouth 2 (two) times daily as needed for moderate pain.     [provider]    Allergies    Amoxicillin, Ciprofloxacin, Macrodantin [nitrofurantoin], and Sulfa antibiotics  Review of Systems   Review of Systems  Constitutional: Negative for chills and fever.  HENT: Positive for rhinorrhea. Negative for congestion and sore throat.   Eyes: Positive for visual disturbance. Negative for redness.  Respiratory: Negative for shortness of breath.   Cardiovascular: Positive for leg swelling. Negative for chest pain.  Gastrointestinal: Positive for abdominal pain, constipation and nausea. Negative for vomiting.  Genitourinary: Negative for dysuria and hematuria.  Musculoskeletal: Negative for back pain and myalgias.  Skin: Negative for rash and wound.  Neurological: Positive for light-headedness. Negative for syncope and headaches.    Physical Exam Updated Vital Signs BP 133/66   Pulse (!) 56   Temp 98.2 F (36.8 C) (Oral)   Resp 18   SpO2 100%   Physical Exam Vitals and nursing note reviewed.  Constitutional:      General: She is not in acute distress.    Appearance: She is well-developed and normal weight. She is not ill-appearing, toxic-appearing or diaphoretic.  HENT:     Head: Normocephalic and atraumatic.  Eyes:     Extraocular Movements: Extraocular movements intact.  Cardiovascular:     Rate and Rhythm: Normal rate and regular rhythm.      Heart sounds: Normal heart sounds.  Pulmonary:     Effort: Pulmonary effort is normal.     Breath sounds: Normal breath sounds. No decreased breath sounds, wheezing or rhonchi.  Chest:     Chest wall: No tenderness.  Abdominal:     Palpations: Abdomen is soft.  Musculoskeletal:     Right lower leg: Tenderness present. Edema present.     Left lower leg: Tenderness present. Edema present.     Comments: Trace pitting edema to b/l LE   Skin:    General: Skin is warm.     Findings: No rash.  Neurological:     Mental Status: She is alert and oriented to person, place, and time.     ED Results / Procedures / Treatments   Labs (all labs ordered are listed, but only abnormal results are displayed)  Labs Reviewed  BASIC METABOLIC PANEL - Abnormal; Notable for the following components:      Result Value   Creatinine, Ser 1.04 (*)    GFR calc non Af Amer 47 (*)    GFR calc Af Amer 54 (*)    All other components within normal limits  CBC - Abnormal; Notable for the following components:   WBC 16.1 (*)    Hemoglobin 11.9 (*)    All other components within normal limits  TROPONIN I (HIGH SENSITIVITY)  TROPONIN I (HIGH SENSITIVITY)    EKG None  Radiology DG Chest 2 View  Result Date: 02/26/2020 CLINICAL DATA:  Chest pain EXAM: CHEST - 2 VIEW COMPARISON:  November 12, 2019 and chest CT November 12, 2019 FINDINGS: The lungs are clear. Heart is borderline enlarged with pulmonary vascularity normal. No adenopathy. There is aortic atherosclerosis. No pneumothorax. No bone lesions. IMPRESSION: Lungs clear. Heart borderline enlarged. Aortic Atherosclerosis (ICD10-I70.0). Electronically Signed   By: Lowella Grip III M.D.   On: 02/26/2020 12:03    Procedures Procedures (including critical care time)  Medications Ordered in ED Medications - No data to display  ED Course  I have reviewed the triage vital signs and the nursing notes.  Pertinent labs & imaging results that were available  during my care of the patient were reviewed by me and considered in my medical decision making (see chart for details).    MDM Rules/Calculators/A&P                           ARIANN KHAIMOV is a 86 y.o. female who presented to the ED today w pre-syncopal event with nausea, lightheadedness, CP, SOB that occurred after eating breakfast. Pt states BP found to be  SBP 80s. PMHx significant for HLD, CKD3, CAD, Aortic Aneurysm, GERD. Reviewed and confirmed nursing documentation for past medical history, family history, social history. On my initial exam, the pt was afebrile, HDS, NAD. Pt resting comfortably on stretcher. Physical exam wo focal findings. Specifically no cardiac murmur on auscultation. Lungs clear in all fields. No LE edema. No rash. No signs of trauma.  Clinical picture could be secondary to vasovagal etiology versus dehydration. Patient initially sitting in chair with symptom onset, but improvement with laying flat and recurrence of symptoms when standing again. Pharmacy also considered in setting of daily tramadol use for chronic pain. Patient has no concerning significant signs or symptoms of syncope including exertional chest pain or exertional syncope. Patient had no chest pain, no tearing chest pain, no back pain, no concerning EKG findings. Specifically no signs of Brugada, HOCM, pre-excitation, or prolonged QT.  No seizure like activity reports, no postictal period, loss of incontinence, Doubt Seizure. Patient denies any hematemesis, hematochezia. No diplopia , dysarthria, ataxia, or vertigo prior to or after the event, doubt posterior circulation stroke.  Patient does endorse good PO intake.  Patient does endorse symptoms at night for the past several months which appear to follow nightly metoprolol dose. Chart review demonstrates recent ED/primary care evaluation for similar complaints.  In setting of history of CAD, ACS rule out completed.  EKG completed and significant for sinus  arrhythmia.  No acute cardiopulmonary abnormalities on CXR.  Serial troponin WNL (5, 5).  Chart review demonstrates aortic aneurysm stable on prior evaluation.  BMP without significant abnormalities.  CR, BUN stable to prior.  On reevaluation patient appears back to baseline, asymptomatic.  Based on the above findings, I  believe patient is hemodynamically stable for discharge with close outpatient follow up. Patient states she will follow up closely with PCP. Already has outpatient echo/stress test scheduled for this month. Patient and family educated about specific return precautions for given chief complaint and symptoms.  Patient and family educated about follow-up with PCP and Cardiology.  Patient and family expressed understanding of return precautions and need for follow-up.  Patient discharged.      Final Clinical Impression(s) / ED Diagnoses Final diagnoses:  Near syncope    Rx / DC Orders ED Discharge Orders    None       Kennyth Lose, MD 02/27/20 1412    Davonna Belling, MD 02/27/20 1530

## 2020-02-26 NOTE — ED Triage Notes (Signed)
Pt to triage via EMS from home.  Reports dizziness, nausea, and chest pain that started after eating breakfast.  Chest pain and nausea has resolved.  Pt states she has been having intermittent dizziness at night that she thought was related to her medication.

## 2020-03-02 DIAGNOSIS — R61 Generalized hyperhidrosis: Secondary | ICD-10-CM | POA: Diagnosis not present

## 2020-03-02 DIAGNOSIS — C9110 Chronic lymphocytic leukemia of B-cell type not having achieved remission: Secondary | ICD-10-CM | POA: Diagnosis not present

## 2020-03-02 DIAGNOSIS — R55 Syncope and collapse: Secondary | ICD-10-CM | POA: Diagnosis not present

## 2020-03-02 DIAGNOSIS — K219 Gastro-esophageal reflux disease without esophagitis: Secondary | ICD-10-CM | POA: Diagnosis not present

## 2020-03-02 DIAGNOSIS — N1831 Chronic kidney disease, stage 3a: Secondary | ICD-10-CM | POA: Diagnosis not present

## 2020-04-21 ENCOUNTER — Inpatient Hospital Stay: Payer: Medicare Other | Attending: Hematology | Admitting: Hematology

## 2020-04-21 ENCOUNTER — Inpatient Hospital Stay: Payer: Medicare Other

## 2020-04-21 ENCOUNTER — Telehealth: Payer: Self-pay | Admitting: Hematology

## 2020-04-21 ENCOUNTER — Other Ambulatory Visit: Payer: Self-pay

## 2020-04-21 VITALS — BP 143/90 | HR 92 | Temp 97.3°F | Resp 18 | Ht 63.0 in | Wt 155.7 lb

## 2020-04-21 DIAGNOSIS — C9110 Chronic lymphocytic leukemia of B-cell type not having achieved remission: Secondary | ICD-10-CM | POA: Insufficient documentation

## 2020-04-21 DIAGNOSIS — Z23 Encounter for immunization: Secondary | ICD-10-CM | POA: Diagnosis not present

## 2020-04-21 DIAGNOSIS — M8580 Other specified disorders of bone density and structure, unspecified site: Secondary | ICD-10-CM | POA: Insufficient documentation

## 2020-04-21 DIAGNOSIS — Z79899 Other long term (current) drug therapy: Secondary | ICD-10-CM | POA: Diagnosis not present

## 2020-04-21 DIAGNOSIS — Z87891 Personal history of nicotine dependence: Secondary | ICD-10-CM | POA: Insufficient documentation

## 2020-04-21 DIAGNOSIS — R61 Generalized hyperhidrosis: Secondary | ICD-10-CM | POA: Insufficient documentation

## 2020-04-21 DIAGNOSIS — K5900 Constipation, unspecified: Secondary | ICD-10-CM | POA: Insufficient documentation

## 2020-04-21 DIAGNOSIS — R413 Other amnesia: Secondary | ICD-10-CM | POA: Insufficient documentation

## 2020-04-21 DIAGNOSIS — E785 Hyperlipidemia, unspecified: Secondary | ICD-10-CM | POA: Insufficient documentation

## 2020-04-21 DIAGNOSIS — K219 Gastro-esophageal reflux disease without esophagitis: Secondary | ICD-10-CM | POA: Insufficient documentation

## 2020-04-21 DIAGNOSIS — I259 Chronic ischemic heart disease, unspecified: Secondary | ICD-10-CM | POA: Diagnosis not present

## 2020-04-21 LAB — CBC WITH DIFFERENTIAL/PLATELET
Abs Immature Granulocytes: 0.03 10*3/uL (ref 0.00–0.07)
Basophils Absolute: 0.1 10*3/uL (ref 0.0–0.1)
Basophils Relative: 0 %
Eosinophils Absolute: 0.1 10*3/uL (ref 0.0–0.5)
Eosinophils Relative: 1 %
HCT: 35.2 % — ABNORMAL LOW (ref 36.0–46.0)
Hemoglobin: 11.8 g/dL — ABNORMAL LOW (ref 12.0–15.0)
Immature Granulocytes: 0 %
Lymphocytes Relative: 67 %
Lymphs Abs: 12 10*3/uL — ABNORMAL HIGH (ref 0.7–4.0)
MCH: 30.9 pg (ref 26.0–34.0)
MCHC: 33.5 g/dL (ref 30.0–36.0)
MCV: 92.1 fL (ref 80.0–100.0)
Monocytes Absolute: 1.2 10*3/uL — ABNORMAL HIGH (ref 0.1–1.0)
Monocytes Relative: 7 %
Neutro Abs: 4.5 10*3/uL (ref 1.7–7.7)
Neutrophils Relative %: 25 %
Platelets: 301 10*3/uL (ref 150–400)
RBC: 3.82 MIL/uL — ABNORMAL LOW (ref 3.87–5.11)
RDW: 14.4 % (ref 11.5–15.5)
WBC: 17.8 10*3/uL — ABNORMAL HIGH (ref 4.0–10.5)
nRBC: 0 % (ref 0.0–0.2)

## 2020-04-21 LAB — CMP (CANCER CENTER ONLY)
ALT: 11 U/L (ref 0–44)
AST: 21 U/L (ref 15–41)
Albumin: 3.9 g/dL (ref 3.5–5.0)
Alkaline Phosphatase: 73 U/L (ref 38–126)
Anion gap: 8 (ref 5–15)
BUN: 17 mg/dL (ref 8–23)
CO2: 25 mmol/L (ref 22–32)
Calcium: 10.1 mg/dL (ref 8.9–10.3)
Chloride: 107 mmol/L (ref 98–111)
Creatinine: 1.08 mg/dL — ABNORMAL HIGH (ref 0.44–1.00)
GFR, Est AFR Am: 52 mL/min — ABNORMAL LOW (ref >60)
GFR, Estimated: 45 mL/min — ABNORMAL LOW (ref >60)
Glucose, Bld: 82 mg/dL (ref 70–99)
Potassium: 4.5 mmol/L (ref 3.5–5.1)
Sodium: 140 mmol/L (ref 135–145)
Total Bilirubin: 0.4 mg/dL (ref 0.3–1.2)
Total Protein: 6.6 g/dL (ref 6.5–8.1)

## 2020-04-21 LAB — LACTATE DEHYDROGENASE: LDH: 200 U/L — ABNORMAL HIGH (ref 98–192)

## 2020-04-21 NOTE — Progress Notes (Signed)
HEMATOLOGY/ONCOLOGY CLINIC NOTE  Date of Service: 04/21/2020  Patient Care Team: [name], MD as PCP - General (Internal Medicine) Belva Crome, MD as PCP - Cardiology (Cardiology)  CHIEF COMPLAINTS/PURPOSE OF CONSULTATION:   F/u for CLL  HISTORY OF PRESENTING ILLNESS:   Rebecca BORROR is a wonderful 86 y.o. female who has been referred to [place] by Dr .[name], MD  for evaluation and management of lymphocytosis.  Patient has a history of osteopenia, dyslipidemia, GERD and As per PCP notes a known diagnosis of CLL diagnosed in March 2011. Patient is not very aware of this diagnosis when asked today.  She notes that she recently has had a cough and a cold from early November 2017 which she has had difficulties getting over. Notes that she had some fevers and chills at the time and was treated with azithromycin for 5 days. She also notes that she go to steroid shot.  She notes no objective fevers at this time. She is currently afebrile with a temperature of 98.41F and saturating 97% on room air. She has a history of GERD and notes some upper abdominal discomfort. She had an ultrasound of the abdomen done by her primary care physician on 08/02/2016 which showed no acute intra-abdominal pathology. No splenomegaly..   She reports some increased fatigue and weight loss from 172 down to 160 pounds. She also notes some decreased appetite. She notes that her daughter had ALL and wonders what her blood counts mean.  She notes no overt palpable new lymph nodes. Currently no acute shortness of breath or chest pain.  Labs done in our clinic today showed no anemia with a normal hemoglobin of 12.3, mild leukocytosis of 15.5k with 8.8k lymphocytes. Normal platelets.  She had a chest x-ray which showed no acute infiltrates but overinflation suggestive of COPD. procalcitonin was in normal limits and did not suggest an ongoing bacterial process. Labs also did not show any  hypogammaglobinemia.   INTERVAL HISTORY: Rebecca King is here for management and evaluation of her CLL. The patient's last visit with [place] was on 10/21/2019. The pt reports that she is doing well overall.  The pt reports that she has continued experiencing a burning sensation in her chest when she lays down at night. She also experiences hiccups upon waking up. Pt has been taking Ranitidine at night. Pt has night sweats nearly every night. She can feel herself perspiring, but does not feel that they are drenching. Pt feels warm during these episodes.   She has recently been experiencing issues with her memory. Pt has been having difficulty remembering her son's names and items that she is searching for.   Lab results today (04/21/20) of CBC w/diff and CMP is as follows: all values are WNL except for WBC at 17.8K, RBC at 3.82, Hgb at 11.8, HCT at 35.2, Creatinine at 1.08, GFR Est Non Af Am at 45. 04/21/2020 LDH at 200  On review of systems, pt reports acid reflux, night sweats, memory loss, constipation and denies fevers, chills, new lumps/bumps, abdominal pain and any other symptoms.    MEDICAL HISTORY:  Past Medical History:  Diagnosis Date  . Abnormal nuclear stress test 01/26/2009   POST EXERCISE PVC's NOTED SINCE 2003  . CLL (chronic lymphocytic leukemia) ([place]) 10/2009   DR. Adonte Vanriper  . Colon polyp    HYPERPLASTIC  . Diverticulosis   . DJD (degenerative joint disease)   . GERD (gastroesophageal reflux disease)   . Hearing loss  DR. Wilburn Cornelia  . Hypercholesterolemia   . Hyperlipidemia   . Iron deficiency anemia 05/2010  . Lesion of breast    LEFT, SMALL  . Microscopic hematuria    DR. PETERSON  . Neuropathy    PERIPHERAL  . Osteopenia   . Renal cyst 10/2009   RENAL CYST ON CT 11/2009  . Renal disease    STAGE 3  . Tendon cysts    EXTENSOR 2MM RIGHT FIRST FINGER  . Vitamin D deficiency 10/2011  DJD knees Diverticulosis Hearing loss Poor vision CLL March 2011 Microscopic  hematuria.  SURGICAL HISTORY: Past Surgical History:  Procedure Laterality Date  . LEFT HEART CATHETERIZATION WITH CORONARY ANGIOGRAM N/A 12/25/2011   Procedure: LEFT HEART CATHETERIZATION WITH CORONARY ANGIOGRAM;  Surgeon: Sinclair Grooms, MD;  Location: [facility] CATH LAB;  Service: Cardiovascular;  Laterality: N/A;    SOCIAL HISTORY: Social History   Socioeconomic History  . Marital status: Married    Spouse name: Not on file  . Number of children: Not on file  . Years of education: Not on file  . Highest education level: Not on file  Occupational History  . Occupation: UNEMPLOYED  Tobacco Use  . Smoking status: Former Smoker    Quit date: 06/25/1980    Years since quitting: 39.8  . Smokeless tobacco: Never Used  Vaping Use  . Vaping Use: Never used  Substance and Sexual Activity  . Alcohol use: Yes  . Drug use: No  . Sexual activity: Not on file  Other Topics Concern  . Not on file  Social History Narrative  . Not on file   Social Determinants of Health   Financial Resource Strain:   . Difficulty of Paying Living Expenses: Not on file  Food Insecurity:   . Worried About Charity fundraiser in the Last Year: Not on file  . Ran Out of Food in the Last Year: Not on file  Transportation Needs:   . Lack of Transportation (Medical): Not on file  . Lack of Transportation (Non-Medical): Not on file  Physical Activity:   . Days of Exercise per Week: Not on file  . Minutes of Exercise per Session: Not on file  Stress:   . Feeling of Stress : Not on file  Social Connections:   . Frequency of Communication with Friends and Family: Not on file  . Frequency of Social Gatherings with Friends and Family: Not on file  . Attends Religious Services: Not on file  . Active Member of Clubs or Organizations: Not on file  . Attends Archivist Meetings: Not on file  . Marital Status: Not on file  Intimate Partner Violence:   . Fear of Current or Ex-Partner: Not on file  .  Emotionally Abused: Not on file  . Physically Abused: Not on file  . Sexually Abused: Not on file    FAMILY HISTORY: Family History  Family history unknown: Yes    ALLERGIES:  is allergic to amoxicillin, ciprofloxacin, macrodantin [nitrofurantoin], and sulfa antibiotics.  MEDICATIONS:  Current Outpatient Medications  Medication Sig Dispense Refill  . acetaminophen (TYLENOL) 500 MG tablet Take 500 mg by mouth 2 (two) times daily.     Marland Kitchen aspirin 81 MG tablet Take 81 mg by mouth daily.    . Coenzyme Q10 (CO Q 10 PO) Take 1 tablet by mouth daily.    . metoprolol succinate (TOPROL XL) 25 MG 24 hr tablet Take 0.5 tablets (12.5 mg total) by mouth daily. [place]  tablet 6  . Multiple Vitamin (MULITIVITAMIN WITH MINERALS) TABS Take 1 tablet by mouth daily.    Marland Kitchen NITROSTAT 0.4 MG SL tablet DISSOLVE ONE TABLET UNDERTONGUE AS DIRECTED AS    NEEDED FOR CHEST PAIN 25 tablet 0  . ondansetron (ZOFRAN ODT) 4 MG disintegrating tablet 4mg  ODT q4 hours prn nausea/vomit 8 tablet 0  . polyethylene glycol (MIRALAX / GLYCOLAX) packet Take 17 g by mouth every other day.     . Probiotic Product (ALIGN PO) Take 1 capsule by mouth as needed.     . rosuvastatin (CRESTOR) 5 MG tablet Take 5 mg by mouth at bedtime.    . traMADol (ULTRAM) 50 MG tablet Take 50 mg by mouth 2 (two) times daily as needed for moderate pain.      No current facility-administered medications for this visit.    REVIEW OF SYSTEMS:   A 10+ POINT REVIEW OF SYSTEMS WAS OBTAINED including neurology, dermatology, psychiatry, cardiac, respiratory, lymph, extremities, GI, GU, Musculoskeletal, constitutional, breasts, reproductive, HEENT.  All pertinent positives are noted in the HPI.  All others are negative.   PHYSICAL EXAMINATION: ECOG PERFORMANCE STATUS: 2 - Symptomatic, <50% confined to bed    Vitals:   04/21/20 1350  BP: (!) 143/90  Pulse: 92  Resp: 18  Temp: (!) 97.3 F (36.3 C)  SpO2: 98%   Filed Weights   04/21/20 1350  Weight: 155  lb 11.2 oz (70.6 kg)   Body mass index is 27.58 kg/m.  Exam was given in a chair   GENERAL:alert, in no acute distress and comfortable SKIN: no acute rashes, no significant lesions EYES: conjunctiva are pink and non-injected, sclera anicteric OROPHARYNX: MMM, no exudates, no oropharyngeal erythema or ulceration NECK: supple, no JVD LYMPH:  no palpable lymphadenopathy in the cervical, axillary or inguinal regions LUNGS: clear to auscultation b/l with normal respiratory effort HEART: regular rate & rhythm ABDOMEN:  normoactive bowel sounds , non tender, not distended. No palpable hepatosplenomegaly.  Extremity: no pedal edema PSYCH: alert & oriented x 3 with fluent speech NEURO: no focal motor/sensory deficits  LABORATORY DATA:  I have reviewed the data as listed  . CBC Latest Ref Rng & Units 04/21/2020 02/26/2020 11/12/2019  WBC 4.0 - 10.5 K/uL 17.8(H) 16.1(H) 11.6(H)  Hemoglobin 12.0 - 15.0 g/dL 11.8(L) 11.9(L) 11.7(L)  Hematocrit 36 - 46 % 35.2(L) 36.6 36.4  Platelets 150 - 400 K/uL 301 305 292   . CBC    Component Value Date/Time   WBC 17.8 (H) 04/21/2020 1332   RBC 3.82 (L) 04/21/2020 1332   HGB 11.8 (L) 04/21/2020 1332   HGB 11.3 (L) 04/25/2018 1458   HGB 12.2 04/26/2017 1344   HCT 35.2 (L) 04/21/2020 1332   HCT 36.3 04/26/2017 1344   PLT 301 04/21/2020 1332   PLT 316 04/25/2018 1458   PLT 296 04/26/2017 1344   MCV 92.1 04/21/2020 1332   MCV 94.3 04/26/2017 1344   MCH 30.9 04/21/2020 1332   MCHC 33.5 04/21/2020 1332   RDW 14.4 04/21/2020 1332   RDW 15.4 (H) 04/26/2017 1344   LYMPHSABS 12.0 (H) 04/21/2020 1332   LYMPHSABS 7.8 (H) 04/26/2017 1344   MONOABS 1.2 (H) 04/21/2020 1332   MONOABS 0.8 04/26/2017 1344   EOSABS 0.1 04/21/2020 1332   EOSABS 0.1 04/26/2017 1344   BASOSABS 0.1 04/21/2020 1332   BASOSABS 0.0 04/26/2017 1344   . CMP Latest Ref Rng & Units 04/21/2020 02/26/2020 11/12/2019  Glucose 70 - 99 mg/dL 82 95 93  BUN 8 - 23 mg/dL 17 19 19   Creatinine  0.44 - 1.00 mg/dL 1.08(H) 1.04(H) 1.10(H)  Sodium 135 - 145 mmol/L 140 137 142  Potassium 3.5 - 5.1 mmol/L 4.5 4.5 4.0  Chloride 98 - 111 mmol/L 107 102 108  CO2 22 - 32 mmol/L 25 25 26   Calcium 8.9 - 10.3 mg/dL 10.1 10.1 10.1  Total Protein 6.5 - 8.1 g/dL 6.6 - -  Total Bilirubin 0.3 - 1.2 mg/dL 0.4 - -  Alkaline Phos 38 - 126 U/L 73 - -  AST 15 - 41 U/L 21 - -  ALT 0 - 44 U/L 11 - -   . Lab Results  Component Value Date   LDH 200 (H) 04/21/2020             RADIOGRAPHIC STUDIES: I have personally reviewed the radiological images as listed and agreed with the findings in the report. No results found.  ASSESSMENT & PLAN:   86 y.o. female with   1) Rai Stage 1 Chronic lymphocytic leukemia. Apparently was previously diagnosed in March 2011 but no data available from this. CLL Fish prognostic panel showed predominantly 13q deletion in 63% of the cells. This typically suggests a good prognosis small clone with trisomy 85 which is an intermediate prognosis.   Normal LDH levels. No palpable splenomegaly. No significant cytopenias at this time. Minimal cervical lymphadenopathy. No significant weight loss at this time.  No evidence of hypogammaglobinemia related to CLL, IgG levels tested in Jan were normal.  #2) Shingles- Resolved. -PCP could consider referral to Interventional pain specialist for nerve block for post-herpetic neuralgias     PLAN: -Discussed pt labwork today, 04/21/20; blood counts and chemistries are steady, LDH is borderline elevated -No lab or clinical evidence of CLL progression at this time. No indication to begin treatment. Will continue watchful observation. -Recommend pt take Nexium or Omeprazole to improve acid reflux. Recommend pt take before breakfast. Okay to take twice per day. -Advised pt that some memory loss is normal as we age. Advised that stress, medication, and lack of sleep can negatively impact memory. -Recommend pt f/u with PCP as  scheduled to r/o reversible causes of memory loss.  -Advised pt that if there are no reversible factors they may also complete a w/o for dementia.  -Discussed the CDC guidelines regarding the COVID19 booster. Will give in clinic tomorrow.  -Will see back in 6 months with labs   FOLLOW UP: Plz schedule patient and her husband for covid booster vaccine tomorrow RTC with Dr Irene Limbo with labs in 6 months    The total time spent in the appt was 20 minutes and more than 50% was on counseling and direct patient cares.  All of the patient's questions were answered with apparent satisfaction. The patient knows to call the clinic with any problems, questions or concerns.    Sullivan Lone MD [place] AAHIVMS [facility] Hematology/Oncology Physician [facility]  (Office):       [phone] (Work cell):  [phone] (Fax):           [phone]  04/21/2020 2:57 PM  I, Yevette Edwards, am acting as a scribe for Dr. Sullivan Lone.   .I have reviewed the above documentation for accuracy and completeness, and I agree with the above. Brunetta Genera MD

## 2020-04-21 NOTE — Telephone Encounter (Signed)
Scheduled appointment per 9/8 los. Did not scheduled patient's Covid booster shot because patient requested to call back later to schedule. Patient needed to speak to her husband first. I gave patient an updated calendar with her scheduled appointments.

## 2020-04-29 ENCOUNTER — Inpatient Hospital Stay: Payer: Medicare Other

## 2020-04-29 ENCOUNTER — Other Ambulatory Visit: Payer: Self-pay

## 2020-04-29 DIAGNOSIS — Z23 Encounter for immunization: Secondary | ICD-10-CM

## 2020-04-29 NOTE — Progress Notes (Signed)
Rebecca King was was observed post Covid-19 immunization, 3rd dose, for 30 min without incident. She was provided with vaccine information sheet and instructions for side effects, if they may occur. Pt verbalized full understanding

## 2020-04-29 NOTE — Patient Instructions (Signed)
Immunization Schedule, 65 Years and Older  Vaccines are usually given at various ages, according to a schedule. Your health care provider will recommend vaccines for you based on your age, medical history, and lifestyle or other factors such as travel or where you work. You may receive vaccines as individual doses or as more than one vaccine together in one shot (combination vaccines). Talk with your health care provider about the risks and benefits of combination vaccines. Recommended immunizations for 37 and older Influenza vaccine  You should get a dose of the influenza vaccine every year. Tetanus, diphtheria, and pertussis vaccine A vaccine that protects against tetanus, diphtheria, and pertussis is known as the Tdap vaccine. A vaccine that protects against tetanus and diphtheria is known as the Td vaccine.  You should only get the Td vaccine if you have had at least 1 dose of the Tdap vaccine.  You should get 1 dose of the Td or Tdap vaccine every 10 years, or you should get 1 dose of the Tdap vaccine if: ? You have not previously gotten a Tdap vaccine. ? You do not know if you have ever gotten a Tdap vaccine. Zoster vaccine This is also known as the RZV vaccine. You should get 2 doses of the RZV vaccine 2 to 6 months apart. It is important to get the RZV vaccine even if you:  Have had shingles.  Have received the ZVL vaccine, an older version of the RZV vaccine.  Are unsure if you have had chickenpox (varicella). Pneumococcal polysaccharide vaccine This is also known as the PPSV23 vaccine. You should get 1 dose of the PPSV23 vaccine. Pneumococcal conjugate vaccine This is also known as the PCV13 vaccine. Talk to your health care provider about whether it is appropriate for you to get the PCV13 vaccine. Hepatitis A vaccine This is also known as the HepA vaccine. If you did not get the HepA vaccine previously, you should get it if:  You are at risk for a hepatitis A infection. You  may be at risk for infection if you: ? Have chronic liver disease. ? Have HIV or AIDS. ? Are a man who has sex with men. ? Use drugs. ? Are homeless. ? May be exposed to hepatitis A through work. ? Travel to countries where hepatitis A is common. ? Have or will have close contact with someone who was adopted from another country.  You are not at risk for infection but want protection from hepatitis A. Hepatitis B vaccine This is also known as the HepB vaccine. If you did not get the HepB vaccine previously, you should get it if:  You are at risk for hepatitis B infection. You are at risk if you: ? Have chronic liver disease. ? Have HIV or AIDS. ? Have sex with a partner who has hepatitis B, or:  You have multiple sex partners.  You are a man who has sex with men. ? Use drugs. ? May be exposed to hepatitis B through work. ? Live with someone who has hepatitis B. ? Receive dialysis treatment. ? Have diabetes. ? Travel to countries where hepatitis B is common.  You are not at risk of infection but want protection from hepatitis B. Varicella vaccine This is also known as the VAR vaccine. You may need to get the VAR vaccine if you do not have evidence of immunity (by a blood test) and you may be exposed to varicella through work. This is especially important if you work  in a health care setting. Meningococcal conjugate vaccine This is also known as the MenACWY vaccine. You may need to get the MenACWY vaccine if you:  Have not been given the vaccine before.  Need to catch up on doses you missed in the past. This vaccine is especially important if you:  Do not have a spleen.  Have sickle cell disease.  Have HIV.  Take medicines that suppress your body's disease-fighting system (immune system).  Travel to countries where meningococcal disease is common.  Are exposed to Neisseria meningitidis at work. Serogroup B meningococcal vaccine This is also known as the MenB vaccine.  You may need to get the MenB vaccine if you:  Have not been given the vaccine before.  Need to catch up on doses you missed in the past. This vaccine is especially important if you:  Do not have a spleen.  Have sickle cell disease.  Take medicines that suppress your immune system.  Are exposed to Neisseria meningitidis at work. Haemophilus influenzae type b vaccine This is also known as the Hib vaccine. Anyone older than 84 years of age is usually not given the Hib vaccine. However, if you have certain high-risk conditions, you may need to get this vaccine. These conditions include:  Not having a spleen.  Having received a stem cell transplant. Before you get a vaccine: Talk with your health care provider about which vaccines are right for you. This is especially important if:  You previously had a reaction after getting a vaccine.  You have a weakened immune system. You may have a weakened immune system if you: ? Are taking medicines that reduce (suppress) the activity of your immune system. ? Are taking medicines to treat cancer (chemotherapy). ? Have HIV or AIDS.  You work in an environment where you may be exposed to a disease.  You plan to travel outside of the country.  You have a chronic illness, such as heart disease, kidney disease, diabetes, or lung disease. Summary  Before you get a vaccine, tell your health care provider if you have reacted to vaccines in the past or have a condition that weakens your immune system.  You should get a dose of the influenza vaccine every year and a dose of the Td or Tdap vaccine every 10 years.  You should get 2 doses of the RZV vaccine 2 to 6 months apart.  Depending on your medical history and your risk factors, you may need other vaccines. Ask your health care provider whether you are up to date on all your vaccines. This information is not intended to replace advice given to you by your health care provider. Make sure you  discuss any questions you have with your health care provider. Document Revised: 05/27/2019 Document Reviewed: 05/27/2019 Elsevier Patient Education  [employer].

## 2020-05-05 DIAGNOSIS — H10412 Chronic giant papillary conjunctivitis, left eye: Secondary | ICD-10-CM | POA: Diagnosis not present

## 2020-05-13 DIAGNOSIS — H10412 Chronic giant papillary conjunctivitis, left eye: Secondary | ICD-10-CM | POA: Diagnosis not present

## 2020-05-24 DIAGNOSIS — K219 Gastro-esophageal reflux disease without esophagitis: Secondary | ICD-10-CM | POA: Diagnosis not present

## 2020-05-24 DIAGNOSIS — R1013 Epigastric pain: Secondary | ICD-10-CM | POA: Diagnosis not present

## 2020-05-24 DIAGNOSIS — R55 Syncope and collapse: Secondary | ICD-10-CM | POA: Diagnosis not present

## 2020-06-09 DIAGNOSIS — M1712 Unilateral primary osteoarthritis, left knee: Secondary | ICD-10-CM | POA: Diagnosis not present

## 2020-06-15 DIAGNOSIS — H6121 Impacted cerumen, right ear: Secondary | ICD-10-CM | POA: Diagnosis not present

## 2020-06-16 DIAGNOSIS — R55 Syncope and collapse: Secondary | ICD-10-CM | POA: Diagnosis not present

## 2020-06-22 DIAGNOSIS — C9110 Chronic lymphocytic leukemia of B-cell type not having achieved remission: Secondary | ICD-10-CM | POA: Diagnosis not present

## 2020-06-22 DIAGNOSIS — K219 Gastro-esophageal reflux disease without esophagitis: Secondary | ICD-10-CM | POA: Diagnosis not present

## 2020-06-22 DIAGNOSIS — Z23 Encounter for immunization: Secondary | ICD-10-CM | POA: Diagnosis not present

## 2020-06-22 DIAGNOSIS — J301 Allergic rhinitis due to pollen: Secondary | ICD-10-CM | POA: Diagnosis not present

## 2020-07-05 DIAGNOSIS — R55 Syncope and collapse: Secondary | ICD-10-CM | POA: Diagnosis not present

## 2020-07-26 DIAGNOSIS — H903 Sensorineural hearing loss, bilateral: Secondary | ICD-10-CM | POA: Diagnosis not present

## 2020-09-06 DIAGNOSIS — J302 Other seasonal allergic rhinitis: Secondary | ICD-10-CM | POA: Diagnosis not present

## 2020-09-06 DIAGNOSIS — J342 Deviated nasal septum: Secondary | ICD-10-CM | POA: Diagnosis not present

## 2020-09-06 DIAGNOSIS — H903 Sensorineural hearing loss, bilateral: Secondary | ICD-10-CM | POA: Diagnosis not present

## 2020-09-22 DIAGNOSIS — C9110 Chronic lymphocytic leukemia of B-cell type not having achieved remission: Secondary | ICD-10-CM | POA: Diagnosis not present

## 2020-09-22 DIAGNOSIS — G3184 Mild cognitive impairment, so stated: Secondary | ICD-10-CM | POA: Diagnosis not present

## 2020-09-22 DIAGNOSIS — Z0000 Encounter for general adult medical examination without abnormal findings: Secondary | ICD-10-CM | POA: Diagnosis not present

## 2020-09-22 DIAGNOSIS — I700 Atherosclerosis of aorta: Secondary | ICD-10-CM | POA: Diagnosis not present

## 2020-09-22 DIAGNOSIS — N1831 Chronic kidney disease, stage 3a: Secondary | ICD-10-CM | POA: Diagnosis not present

## 2020-09-22 DIAGNOSIS — G629 Polyneuropathy, unspecified: Secondary | ICD-10-CM | POA: Diagnosis not present

## 2020-09-22 DIAGNOSIS — K219 Gastro-esophageal reflux disease without esophagitis: Secondary | ICD-10-CM | POA: Diagnosis not present

## 2020-09-22 DIAGNOSIS — J309 Allergic rhinitis, unspecified: Secondary | ICD-10-CM | POA: Diagnosis not present

## 2020-09-22 DIAGNOSIS — B0229 Other postherpetic nervous system involvement: Secondary | ICD-10-CM | POA: Diagnosis not present

## 2020-09-22 DIAGNOSIS — E7800 Pure hypercholesterolemia, unspecified: Secondary | ICD-10-CM | POA: Diagnosis not present

## 2020-09-22 DIAGNOSIS — I712 Thoracic aortic aneurysm, without rupture: Secondary | ICD-10-CM | POA: Diagnosis not present

## 2020-09-22 DIAGNOSIS — Z1389 Encounter for screening for other disorder: Secondary | ICD-10-CM | POA: Diagnosis not present

## 2020-10-11 ENCOUNTER — Other Ambulatory Visit: Payer: Self-pay

## 2020-10-11 ENCOUNTER — Ambulatory Visit
Admission: RE | Admit: 2020-10-11 | Discharge: 2020-10-11 | Disposition: A | Discharge status: Home / Self Care | Payer: Medicare Other | Source: Ambulatory Visit | Attending: Geriatric Medicine | Admitting: Geriatric Medicine

## 2020-10-11 DIAGNOSIS — I712 Thoracic aortic aneurysm, without rupture, unspecified: Secondary | ICD-10-CM

## 2020-10-11 DIAGNOSIS — I700 Atherosclerosis of aorta: Secondary | ICD-10-CM | POA: Diagnosis not present

## 2020-10-11 MED ORDER — IOPAMIDOL (ISOVUE-370) INJECTION 76%
75.0000 mL | Freq: Once | INTRAVENOUS | Status: AC | PRN
  Administered 2020-10-11: 75 mL via INTRAVENOUS

## 2020-10-18 NOTE — Progress Notes (Signed)
HEMATOLOGY/ONCOLOGY CLINIC NOTE  Date of Service: 10/19/2020  Patient Care Team: [name], MD as PCP - General (Internal Medicine) Belva Crome, MD as PCP - Cardiology (Cardiology)  CHIEF COMPLAINTS/PURPOSE OF CONSULTATION:   F/u for CLL  HISTORY OF PRESENTING ILLNESS:   Rebecca King is a wonderful 87 y.o. female who has been referred to [place] by Dr .[name], MD  for evaluation and management of lymphocytosis.  Patient has a history of osteopenia, dyslipidemia, GERD and As per PCP notes a known diagnosis of CLL diagnosed in March 2011. Patient is not very aware of this diagnosis when asked today.  She notes that she recently has had a cough and a cold from early November 2017 which she has had difficulties getting over. Notes that she had some fevers and chills at the time and was treated with azithromycin for 5 days. She also notes that she go to steroid shot.  She notes no objective fevers at this time. She is currently afebrile with a temperature of 98.17F and saturating 97% on room air. She has a history of GERD and notes some upper abdominal discomfort. She had an ultrasound of the abdomen done by her primary care physician on 08/02/2016 which showed no acute intra-abdominal pathology. No splenomegaly..   She reports some increased fatigue and weight loss from 172 down to 160 pounds. She also notes some decreased appetite. She notes that her daughter had ALL and wonders what her blood counts mean.  She notes no overt palpable new lymph nodes. Currently no acute shortness of breath or chest pain.  Labs done in our clinic today showed no anemia with a normal hemoglobin of 12.3, mild leukocytosis of 15.5k with 8.8k lymphocytes. Normal platelets.  She had a chest x-ray which showed no acute infiltrates but overinflation suggestive of COPD. procalcitonin was in normal limits and did not suggest an ongoing bacterial process. Labs also did not show any  hypogammaglobinemia.   INTERVAL HISTORY:  Rebecca King is here for management and evaluation of her CLL. The patient's last visit with [place] was on 04/21/2020. The pt reports that she is doing well overall.  The pt reports that her recall has been worsening recently. She notes that she goes to the bedroom and forgets what she went there for. However, this is not bothersome to her way of life. She notes that she sometimes messes up her family's name. The pt notes she makes herself a list of three things to do daily. The pt notes she has a burning feeling in her stomach and this happens frequently. The pt is taking some acid reducers for this, but it seems to be due to different types of foods provoking this. The pt also notes she experiences night sweats. The pt notes her urine is normal during the day and is darker in the morning first thing.  Lab results today 10/19/2020 of CBC w/diff and CMP is as follows: all values are WNL except for WBC of 19.2K, RBC of 3.73, Hgb of 11.5, HCT of 35.5, Creatinine of 1.04, GFR est of 50. 10/19/2020 LDH of 142.  On review of systems, pt reports decreased short-term memory recall, night sweats, burning feeling in stomach and denies new lumps/ bumps, fevers, chills, abdominal pain, back pain, leg swelling, and any other symptoms.   MEDICAL HISTORY:  Past Medical History:  Diagnosis Date  . Abnormal nuclear stress test 01/26/2009   POST EXERCISE PVC's NOTED SINCE 2003  . CLL (  chronic lymphocytic leukemia) ([place]) 10/2009   DR. Martia Dalby  . Colon polyp    HYPERPLASTIC  . Diverticulosis   . DJD (degenerative joint disease)   . GERD (gastroesophageal reflux disease)   . Hearing loss    DR. SHOEMAKER  . Hypercholesterolemia   . Hyperlipidemia   . Iron deficiency anemia 05/2010  . Lesion of breast    LEFT, SMALL  . Microscopic hematuria    DR. PETERSON  . Neuropathy    PERIPHERAL  . Osteopenia   . Renal cyst 10/2009   RENAL CYST ON CT 11/2009  . Renal disease     STAGE 3  . Tendon cysts    EXTENSOR 2MM RIGHT FIRST FINGER  . Vitamin D deficiency 10/2011  DJD knees Diverticulosis Hearing loss Poor vision CLL March 2011 Microscopic hematuria.  SURGICAL HISTORY: Past Surgical History:  Procedure Laterality Date  . LEFT HEART CATHETERIZATION WITH CORONARY ANGIOGRAM N/A 12/25/2011   Procedure: LEFT HEART CATHETERIZATION WITH CORONARY ANGIOGRAM;  Surgeon: Sinclair Grooms, MD;  Location: [facility] CATH LAB;  Service: Cardiovascular;  Laterality: N/A;    SOCIAL HISTORY: Social History   Socioeconomic History  . Marital status: Married    Spouse name: Not on file  . Number of children: Not on file  . Years of education: Not on file  . Highest education level: Not on file  Occupational History  . Occupation: UNEMPLOYED  Tobacco Use  . Smoking status: Former Smoker    Quit date: 06/25/1980    Years since quitting: 40.3  . Smokeless tobacco: Never Used  Vaping Use  . Vaping Use: Never used  Substance and Sexual Activity  . Alcohol use: Yes  . Drug use: No  . Sexual activity: Not on file  Other Topics Concern  . Not on file  Social History Narrative  . Not on file   Social Determinants of Health   Financial Resource Strain: Not on file  Food Insecurity: Not on file  Transportation Needs: Not on file  Physical Activity: Not on file  Stress: Not on file  Social Connections: Not on file  Intimate Partner Violence: Not on file    FAMILY HISTORY: Family History  Family history unknown: Yes    ALLERGIES:  is allergic to amoxicillin, ciprofloxacin, macrodantin [nitrofurantoin], and sulfa antibiotics.  MEDICATIONS:  Current Outpatient Medications  Medication Sig Dispense Refill  . acetaminophen (TYLENOL) 500 MG tablet Take 500 mg by mouth 2 (two) times daily.     Marland Kitchen aspirin 81 MG tablet Take 81 mg by mouth daily.    . Coenzyme Q10 (CO Q 10 PO) Take 1 tablet by mouth daily.    . metoprolol succinate (TOPROL XL) 25 MG 24 hr tablet Take  0.5 tablets (12.5 mg total) by mouth daily. (Patient not taking: Reported on 04/22/2020) 30 tablet 6  . Multiple Vitamin (MULITIVITAMIN WITH MINERALS) TABS Take 1 tablet by mouth daily.    Marland Kitchen NITROSTAT 0.4 MG SL tablet DISSOLVE ONE TABLET UNDERTONGUE AS DIRECTED AS    NEEDED FOR CHEST PAIN 25 tablet 0  . ondansetron (ZOFRAN ODT) 4 MG disintegrating tablet 4mg  ODT q4 hours prn nausea/vomit (Patient not taking: Reported on 04/22/2020) 8 tablet 0  . polyethylene glycol (MIRALAX / GLYCOLAX) packet Take 17 g by mouth every other day.     . Probiotic Product (ALIGN PO) Take 1 capsule by mouth as needed.     . rosuvastatin (CRESTOR) 5 MG tablet Take 5 mg by mouth at bedtime.    Marland Kitchen  traMADol (ULTRAM) 50 MG tablet Take 50 mg by mouth 2 (two) times daily as needed for moderate pain.      No current facility-administered medications for this visit.    REVIEW OF SYSTEMS:   10 Point review of Systems was done is negative except as noted above.  PHYSICAL EXAMINATION: ECOG PERFORMANCE STATUS: 2 - Symptomatic, <50% confined to bed   Vitals:   10/19/20 1455  BP: (!) 162/83  Pulse: 99  Resp: 16  Temp: 97.6 F (36.4 C)  SpO2: 98%   Filed Weights   10/19/20 1455  Weight: 153 lb 11.2 oz (69.7 kg)   Body mass index is 27.23 kg/m.  Exam was given in a chair.   GENERAL:alert, in no acute distress and comfortable SKIN: no acute rashes, no significant lesions EYES: conjunctiva are pink and non-injected, sclera anicteric OROPHARYNX: MMM, no exudates, no oropharyngeal erythema or ulceration NECK: supple, no JVD LYMPH:  no palpable lymphadenopathy in the cervical, axillary or inguinal regions LUNGS: clear to auscultation b/l with normal respiratory effort HEART: regular rate & rhythm ABDOMEN:  normoactive bowel sounds , non tender, not distended. Extremity: no pedal edema PSYCH: alert & oriented x 3 with fluent speech NEURO: no focal motor/sensory deficits  LABORATORY DATA:  I have reviewed the data  as listed  . CBC Latest Ref Rng & Units 10/19/2020 04/21/2020 02/26/2020  WBC 4.0 - 10.5 K/uL 19.2(H) 17.8(H) 16.1(H)  Hemoglobin 12.0 - 15.0 g/dL 11.5(L) 11.8(L) 11.9(L)  Hematocrit 36.0 - 46.0 % 35.5(L) 35.2(L) 36.6  Platelets 150 - 400 K/uL 324 301 305   . CBC    Component Value Date/Time   WBC 19.2 (H) 10/19/2020 1436   RBC 3.73 (L) 10/19/2020 1436   HGB 11.5 (L) 10/19/2020 1436   HGB 11.3 (L) 04/25/2018 1458   HGB 12.2 04/26/2017 1344   HCT 35.5 (L) 10/19/2020 1436   HCT 36.3 04/26/2017 1344   PLT 324 10/19/2020 1436   PLT 316 04/25/2018 1458   PLT 296 04/26/2017 1344   MCV 95.2 10/19/2020 1436   MCV 94.3 04/26/2017 1344   MCH 30.8 10/19/2020 1436   MCHC 32.4 10/19/2020 1436   RDW 14.4 10/19/2020 1436   RDW 15.4 (H) 04/26/2017 1344   LYMPHSABS 13.9 (H) 10/19/2020 1436   LYMPHSABS 7.8 (H) 04/26/2017 1344   MONOABS 0.9 10/19/2020 1436   MONOABS 0.8 04/26/2017 1344   EOSABS 0.1 10/19/2020 1436   EOSABS 0.1 04/26/2017 1344   BASOSABS 0.1 10/19/2020 1436   BASOSABS 0.0 04/26/2017 1344   . CMP Latest Ref Rng & Units 10/19/2020 04/21/2020 02/26/2020  Glucose 70 - 99 mg/dL 96 82 95  BUN 8 - 23 mg/dL 19 17 19   Creatinine 0.44 - 1.00 mg/dL 1.04(H) 1.08(H) 1.04(H)  Sodium 135 - 145 mmol/L 136 140 137  Potassium 3.5 - 5.1 mmol/L 4.6 4.5 4.5  Chloride 98 - 111 mmol/L 102 107 102  CO2 22 - 32 mmol/L 26 25 25   Calcium 8.9 - 10.3 mg/dL 9.8 10.1 10.1  Total Protein 6.5 - 8.1 g/dL 6.8 6.6 -  Total Bilirubin 0.3 - 1.2 mg/dL 0.5 0.4 -  Alkaline Phos 38 - 126 U/L 77 73 -  AST 15 - 41 U/L 21 21 -  ALT 0 - 44 U/L 15 11 -   . Lab Results  Component Value Date   LDH 142 10/19/2020             RADIOGRAPHIC STUDIES: I have personally reviewed the radiological  images as listed and agreed with the findings in the report. CT ANGIO CHEST AORTA W/CM & OR WO/CM  Result Date: 10/11/2020 CLINICAL DATA:  Follow-up of aneurysmal disease of the ascending thoracic aorta. EXAM: CT  ANGIOGRAPHY CHEST WITH CONTRAST TECHNIQUE: Multidetector CT imaging of the chest was performed using the standard protocol during bolus administration of intravenous contrast. Multiplanar CT image reconstructions and MIPs were obtained to evaluate the vascular anatomy. CONTRAST:  62mL ISOVUE-370 IOPAMIDOL (ISOVUE-370) INJECTION 76% COMPARISON:  11/12/2019 and additional prior studies. FINDINGS: Cardiovascular: The aortic root is normal in caliber measures approximately 3.4-3.6 cm at the level of the sinuses of Valsalva. Stable mild aneurysmal disease of the ascending thoracic aorta measuring approximately 4.1 cm in greatest diameter by CTA today. The proximal arch measures 3 cm and the distal arch 2.6 cm. The descending thoracic aorta measures 2.2 cm. Stable mild atherosclerosis of the thoracic aorta. No evidence of aortic dissection. Visualized proximal great vessels demonstrate normal patency and branching anatomy. The heart size is within normal limits. No pericardial fluid identified. Central pulmonary arteries are normal in caliber. No significant calcified coronary artery plaque visualized. Mediastinum/Nodes: No enlarged mediastinal, hilar, or axillary lymph nodes. Thyroid gland, trachea, and esophagus demonstrate no significant findings. Lungs/Pleura: There is no evidence of pulmonary edema, consolidation, pneumothorax, nodule or pleural fluid. Upper Abdomen: No acute abnormality. Musculoskeletal: No chest wall abnormality. No acute or significant osseous findings. Review of the MIP images confirms the above findings. IMPRESSION: 1. Stable mild aneurysmal disease of the ascending thoracic aorta measuring approximately 4.1 cm in greatest diameter by CTA today. Recommend annual imaging followup by CTA or MRA. This recommendation follows 2010 ACCF/AHA/AATS/ACR/ASA/SCA/SCAI/SIR/STS/SVM Guidelines for the Diagnosis and Management of Patients with Thoracic Aortic Disease. Circulation. 2010; 121: T024-O973. Aortic  aneurysm NOS (ICD10-I71.9) 2. Stable mild atherosclerosis of the thoracic aorta. Aortic aneurysm NOS (ICD10-I71.9). Electronically Signed   By: Aletta Edouard M.D.   On: 10/11/2020 15:58    ASSESSMENT & PLAN:   87 y.o. female with   1) Rai Stage 1 Chronic lymphocytic leukemia. Apparently was previously diagnosed in March 2011 but no data available from this. CLL Fish prognostic panel showed predominantly 13q deletion in 63% of the cells. This typically suggests a good prognosis small clone with trisomy 38 which is an intermediate prognosis.   Normal LDH levels. No palpable splenomegaly. No significant cytopenias at this time. Minimal cervical lymphadenopathy. No significant weight loss at this time.  No evidence of hypogammaglobinemia related to CLL, IgG levels tested in Jan were normal.  #2) Shingles- Resolved. -PCP could consider referral to Interventional pain specialist for nerve block for post-herpetic neuralgias     PLAN: -Discussed pt labwork today, 10/19/2020; WBC slightly higher, other blood counts stable, chemistries stable. LDH normal.  -Advised pt that some pain medications and antihistamines can decrease recall and make someone feel more off overall. -Recommended pt receive MRI or CT scan of head. Advised pt to f/u w PCP regarding blood testing for irreversible memory loss.  -Advised pt that darker urine color could be due to vitamins she takes. -Advised pt that acid reflux can cause the burning sensation in stomach and salivate more.  -Advised pt that the CLL seems to be behaving itself well at this time. -No lab or clinical evidence of CLL progression at this time. No indication to begin treatment. Will continue watchful observation. -Will see back in 6 months with labs.   FOLLOW UP: RTC w Dr Irene Limbo with labs in 6 months  The total time spent in the appointment was 20 minutes and more than 50% was on counseling and direct patient cares.   All of the patient's  questions were answered with apparent satisfaction. The patient knows to call the clinic with any problems, questions or concerns.    Sullivan Lone MD [place] AAHIVMS [facility] Hematology/Oncology Physician [place]  (Office):       [phone] (Work cell):  [phone] (Fax):           [phone]  10/19/2020 3:51 PM  I, Reinaldo Raddle, am acting as scribe for Dr. Sullivan Lone, MD.     .I have reviewed the above documentation for accuracy and completeness, and I agree with the above. Brunetta Genera MD

## 2020-10-19 ENCOUNTER — Inpatient Hospital Stay: Payer: Medicare Other | Attending: Hematology

## 2020-10-19 ENCOUNTER — Other Ambulatory Visit: Payer: Self-pay

## 2020-10-19 ENCOUNTER — Inpatient Hospital Stay (HOSPITAL_BASED_OUTPATIENT_CLINIC_OR_DEPARTMENT_OTHER): Payer: Medicare Other | Admitting: Hematology

## 2020-10-19 VITALS — BP 162/83 | HR 99 | Temp 97.6°F | Resp 16 | Ht 63.0 in | Wt 153.7 lb

## 2020-10-19 DIAGNOSIS — C9110 Chronic lymphocytic leukemia of B-cell type not having achieved remission: Secondary | ICD-10-CM | POA: Insufficient documentation

## 2020-10-19 LAB — CMP (CANCER CENTER ONLY)
ALT: 15 U/L (ref 0–44)
AST: 21 U/L (ref 15–41)
Albumin: 4.2 g/dL (ref 3.5–5.0)
Alkaline Phosphatase: 77 U/L (ref 38–126)
Anion gap: 8 (ref 5–15)
BUN: 19 mg/dL (ref 8–23)
CO2: 26 mmol/L (ref 22–32)
Calcium: 9.8 mg/dL (ref 8.9–10.3)
Chloride: 102 mmol/L (ref 98–111)
Creatinine: 1.04 mg/dL — ABNORMAL HIGH (ref 0.44–1.00)
GFR, Estimated: 50 mL/min — ABNORMAL LOW (ref >60)
Glucose, Bld: 96 mg/dL (ref 70–99)
Potassium: 4.6 mmol/L (ref 3.5–5.1)
Sodium: 136 mmol/L (ref 135–145)
Total Bilirubin: 0.5 mg/dL (ref 0.3–1.2)
Total Protein: 6.8 g/dL (ref 6.5–8.1)

## 2020-10-19 LAB — CBC WITH DIFFERENTIAL/PLATELET
Abs Immature Granulocytes: 0.04 10*3/uL (ref 0.00–0.07)
Basophils Absolute: 0.1 10*3/uL (ref 0.0–0.1)
Basophils Relative: 0 %
Eosinophils Absolute: 0.1 10*3/uL (ref 0.0–0.5)
Eosinophils Relative: 1 %
HCT: 35.5 % — ABNORMAL LOW (ref 36.0–46.0)
Hemoglobin: 11.5 g/dL — ABNORMAL LOW (ref 12.0–15.0)
Immature Granulocytes: 0 %
Lymphocytes Relative: 72 %
Lymphs Abs: 13.9 10*3/uL — ABNORMAL HIGH (ref 0.7–4.0)
MCH: 30.8 pg (ref 26.0–34.0)
MCHC: 32.4 g/dL (ref 30.0–36.0)
MCV: 95.2 fL (ref 80.0–100.0)
Monocytes Absolute: 0.9 10*3/uL (ref 0.1–1.0)
Monocytes Relative: 5 %
Neutro Abs: 4.3 10*3/uL (ref 1.7–7.7)
Neutrophils Relative %: 22 %
Platelets: 324 10*3/uL (ref 150–400)
RBC: 3.73 MIL/uL — ABNORMAL LOW (ref 3.87–5.11)
RDW: 14.4 % (ref 11.5–15.5)
WBC: 19.2 10*3/uL — ABNORMAL HIGH (ref 4.0–10.5)
nRBC: 0 % (ref 0.0–0.2)

## 2020-10-19 LAB — LACTATE DEHYDROGENASE: LDH: 142 U/L (ref 98–192)

## 2020-11-22 DIAGNOSIS — H52203 Unspecified astigmatism, bilateral: Secondary | ICD-10-CM | POA: Diagnosis not present

## 2020-11-22 DIAGNOSIS — Z961 Presence of intraocular lens: Secondary | ICD-10-CM | POA: Diagnosis not present

## 2020-11-22 DIAGNOSIS — H10413 Chronic giant papillary conjunctivitis, bilateral: Secondary | ICD-10-CM | POA: Diagnosis not present

## 2020-12-03 DIAGNOSIS — M1712 Unilateral primary osteoarthritis, left knee: Secondary | ICD-10-CM | POA: Diagnosis not present

## 2020-12-03 DIAGNOSIS — M25551 Pain in right hip: Secondary | ICD-10-CM | POA: Diagnosis not present

## 2021-03-08 DIAGNOSIS — H903 Sensorineural hearing loss, bilateral: Secondary | ICD-10-CM | POA: Diagnosis not present

## 2021-03-08 DIAGNOSIS — Z974 Presence of external hearing-aid: Secondary | ICD-10-CM | POA: Diagnosis not present

## 2021-03-08 DIAGNOSIS — J3489 Other specified disorders of nose and nasal sinuses: Secondary | ICD-10-CM | POA: Diagnosis not present

## 2021-03-08 DIAGNOSIS — H6123 Impacted cerumen, bilateral: Secondary | ICD-10-CM | POA: Diagnosis not present

## 2021-03-08 DIAGNOSIS — K219 Gastro-esophageal reflux disease without esophagitis: Secondary | ICD-10-CM | POA: Diagnosis not present

## 2021-03-28 DIAGNOSIS — C9110 Chronic lymphocytic leukemia of B-cell type not having achieved remission: Secondary | ICD-10-CM | POA: Diagnosis not present

## 2021-03-28 DIAGNOSIS — B351 Tinea unguium: Secondary | ICD-10-CM | POA: Diagnosis not present

## 2021-03-28 DIAGNOSIS — K219 Gastro-esophageal reflux disease without esophagitis: Secondary | ICD-10-CM | POA: Diagnosis not present

## 2021-03-28 DIAGNOSIS — G3184 Mild cognitive impairment, so stated: Secondary | ICD-10-CM | POA: Diagnosis not present

## 2021-03-28 DIAGNOSIS — N1831 Chronic kidney disease, stage 3a: Secondary | ICD-10-CM | POA: Diagnosis not present

## 2021-04-20 ENCOUNTER — Other Ambulatory Visit: Payer: Self-pay

## 2021-04-20 ENCOUNTER — Inpatient Hospital Stay: Payer: Medicare Other | Attending: Hematology

## 2021-04-20 ENCOUNTER — Inpatient Hospital Stay (HOSPITAL_BASED_OUTPATIENT_CLINIC_OR_DEPARTMENT_OTHER): Payer: Medicare Other | Admitting: Hematology

## 2021-04-20 VITALS — BP 140/85 | HR 81 | Temp 98.1°F | Resp 18 | Wt 156.0 lb

## 2021-04-20 DIAGNOSIS — K219 Gastro-esophageal reflux disease without esophagitis: Secondary | ICD-10-CM | POA: Insufficient documentation

## 2021-04-20 DIAGNOSIS — E785 Hyperlipidemia, unspecified: Secondary | ICD-10-CM | POA: Diagnosis not present

## 2021-04-20 DIAGNOSIS — R5383 Other fatigue: Secondary | ICD-10-CM | POA: Diagnosis not present

## 2021-04-20 DIAGNOSIS — Z79899 Other long term (current) drug therapy: Secondary | ICD-10-CM | POA: Diagnosis not present

## 2021-04-20 DIAGNOSIS — M8580 Other specified disorders of bone density and structure, unspecified site: Secondary | ICD-10-CM | POA: Diagnosis not present

## 2021-04-20 DIAGNOSIS — Z87891 Personal history of nicotine dependence: Secondary | ICD-10-CM | POA: Insufficient documentation

## 2021-04-20 DIAGNOSIS — R059 Cough, unspecified: Secondary | ICD-10-CM | POA: Insufficient documentation

## 2021-04-20 DIAGNOSIS — R509 Fever, unspecified: Secondary | ICD-10-CM | POA: Diagnosis not present

## 2021-04-20 DIAGNOSIS — C9110 Chronic lymphocytic leukemia of B-cell type not having achieved remission: Secondary | ICD-10-CM

## 2021-04-20 DIAGNOSIS — M1712 Unilateral primary osteoarthritis, left knee: Secondary | ICD-10-CM | POA: Diagnosis not present

## 2021-04-20 LAB — LACTATE DEHYDROGENASE: LDH: 159 U/L (ref 98–192)

## 2021-04-20 LAB — CBC WITH DIFFERENTIAL/PLATELET
Abs Immature Granulocytes: 0.02 10*3/uL (ref 0.00–0.07)
Basophils Absolute: 0.1 10*3/uL (ref 0.0–0.1)
Basophils Relative: 0 %
Eosinophils Absolute: 0.1 10*3/uL (ref 0.0–0.5)
Eosinophils Relative: 1 %
HCT: 34.3 % — ABNORMAL LOW (ref 36.0–46.0)
Hemoglobin: 11.6 g/dL — ABNORMAL LOW (ref 12.0–15.0)
Immature Granulocytes: 0 %
Lymphocytes Relative: 68 %
Lymphs Abs: 11.8 10*3/uL — ABNORMAL HIGH (ref 0.7–4.0)
MCH: 31.2 pg (ref 26.0–34.0)
MCHC: 33.8 g/dL (ref 30.0–36.0)
MCV: 92.2 fL (ref 80.0–100.0)
Monocytes Absolute: 1.1 10*3/uL — ABNORMAL HIGH (ref 0.1–1.0)
Monocytes Relative: 7 %
Neutro Abs: 4.1 10*3/uL (ref 1.7–7.7)
Neutrophils Relative %: 24 %
Platelets: 297 10*3/uL (ref 150–400)
RBC: 3.72 MIL/uL — ABNORMAL LOW (ref 3.87–5.11)
RDW: 14.6 % (ref 11.5–15.5)
WBC: 17.2 10*3/uL — ABNORMAL HIGH (ref 4.0–10.5)
nRBC: 0 % (ref 0.0–0.2)

## 2021-04-20 LAB — CMP (CANCER CENTER ONLY)
ALT: 10 U/L (ref 0–44)
AST: 17 U/L (ref 15–41)
Albumin: 4.1 g/dL (ref 3.5–5.0)
Alkaline Phosphatase: 72 U/L (ref 38–126)
Anion gap: 9 (ref 5–15)
BUN: 19 mg/dL (ref 8–23)
CO2: 26 mmol/L (ref 22–32)
Calcium: 10.5 mg/dL — ABNORMAL HIGH (ref 8.9–10.3)
Chloride: 105 mmol/L (ref 98–111)
Creatinine: 1.2 mg/dL — ABNORMAL HIGH (ref 0.44–1.00)
GFR, Estimated: 42 mL/min — ABNORMAL LOW (ref >60)
Glucose, Bld: 93 mg/dL (ref 70–99)
Potassium: 4.7 mmol/L (ref 3.5–5.1)
Sodium: 140 mmol/L (ref 135–145)
Total Bilirubin: 0.5 mg/dL (ref 0.3–1.2)
Total Protein: 6.8 g/dL (ref 6.5–8.1)

## 2021-04-21 ENCOUNTER — Telehealth: Payer: Self-pay | Admitting: Hematology

## 2021-04-21 NOTE — Telephone Encounter (Signed)
Left message with follow-up appointment per 9/7 los. 

## 2021-04-26 NOTE — Progress Notes (Signed)
HEMATOLOGY/ONCOLOGY CLINIC NOTE  Date of Service: 04/26/2021  Patient Care Team: [name], MD as PCP - General (Internal Medicine) Belva Crome, MD as PCP - Cardiology (Cardiology)  CHIEF COMPLAINTS/PURPOSE OF CONSULTATION:   F/u for CLL  HISTORY OF PRESENTING ILLNESS:   Rebecca King is a wonderful 87 y.o. female who has been referred to [place] by Dr .[name], MD  for evaluation and management of lymphocytosis.  Patient has a history of osteopenia, dyslipidemia, GERD and As per PCP notes a known diagnosis of CLL diagnosed in March 2011. Patient is not very aware of this diagnosis when asked today.  She notes that she recently has had a cough and a cold from early November 2017 which she has had difficulties getting over. Notes that she had some fevers and chills at the time and was treated with azithromycin for 5 days. She also notes that she go to steroid shot.  She notes no objective fevers at this time. She is currently afebrile with a temperature of 98.34F and saturating 97% on room air. She has a history of GERD and notes some upper abdominal discomfort. She had an ultrasound of the abdomen done by her primary care physician on 08/02/2016 which showed no acute intra-abdominal pathology. No splenomegaly..   She reports some increased fatigue and weight loss from 172 down to 160 pounds. She also notes some decreased appetite. She notes that her daughter had ALL and wonders what her blood counts mean.  She notes no overt palpable new lymph nodes. Currently no acute shortness of breath or chest pain.  Labs done in our clinic today showed no anemia with a normal hemoglobin of 12.3, mild leukocytosis of 15.5k with 8.8k lymphocytes. Normal platelets.  She had a chest x-ray which showed no acute infiltrates but overinflation suggestive of COPD. procalcitonin was in normal limits and did not suggest an ongoing bacterial process. Labs also did not show any  hypogammaglobinemia.   INTERVAL HISTORY:  Rebecca King is here for management and evaluation of her CLL. The patient's last visit with [place] was on 10/19/2020. The pt reports that she is doing well overall.  The pt notes her main concern has been increasing difficulty with memory.  Still functioning independently and lives with her husband who is also still quite active.  No fevers no chills no night sweats no unexpected weight loss.  No new lumps or bumps.  Lab results today 04/20/2021 including CBC CMP and LDH were reviewed with the patient and remained stable.   MEDICAL HISTORY:  Past Medical History:  Diagnosis Date   Abnormal nuclear stress test 01/26/2009   POST EXERCISE PVC's NOTED SINCE 2003   CLL (chronic lymphocytic leukemia) ([place]) 10/2009   DR. Katiya Fike   Colon polyp    HYPERPLASTIC   Diverticulosis    DJD (degenerative joint disease)    GERD (gastroesophageal reflux disease)    Hearing loss    DR. SHOEMAKER   Hypercholesterolemia    Hyperlipidemia    Iron deficiency anemia 05/2010   Lesion of breast    LEFT, SMALL   Microscopic hematuria    DR. PETERSON   Neuropathy    PERIPHERAL   Osteopenia    Renal cyst 10/2009   RENAL CYST ON CT 11/2009   Renal disease    STAGE 3   Tendon cysts    EXTENSOR 2MM RIGHT FIRST FINGER   Vitamin D deficiency 10/2011  DJD knees Diverticulosis Hearing loss Poor vision  CLL March 2011 Microscopic hematuria.  SURGICAL HISTORY: Past Surgical History:  Procedure Laterality Date   LEFT HEART CATHETERIZATION WITH CORONARY ANGIOGRAM N/A 12/25/2011   Procedure: LEFT HEART CATHETERIZATION WITH CORONARY ANGIOGRAM;  Surgeon: Sinclair Grooms, MD;  Location: [facility] CATH LAB;  Service: Cardiovascular;  Laterality: N/A;    SOCIAL HISTORY: Social History   Socioeconomic History   Marital status: Married    Spouse name: Not on file   Number of children: Not on file   Years of education: Not on file   Highest education level: Not on file   Occupational History   Occupation: UNEMPLOYED  Tobacco Use   Smoking status: Former    Types: Cigarettes    Quit date: 06/25/1980    Years since quitting: 40.8   Smokeless tobacco: Never  Vaping Use   Vaping Use: Never used  Substance and Sexual Activity   Alcohol use: Yes   Drug use: No   Sexual activity: Not on file  Other Topics Concern   Not on file  Social History Narrative   Not on file   Social Determinants of Health   Financial Resource Strain: Not on file  Food Insecurity: Not on file  Transportation Needs: Not on file  Physical Activity: Not on file  Stress: Not on file  Social Connections: Not on file  Intimate Partner Violence: Not on file    FAMILY HISTORY: Family History  Family history unknown: Yes    ALLERGIES:  is allergic to amoxicillin, macrolides and ketolides, metoprolol, ciprofloxacin, macrodantin [nitrofurantoin], and sulfa antibiotics.  MEDICATIONS:  Current Outpatient Medications  Medication Sig Dispense Refill   acetaminophen (TYLENOL) 500 MG tablet Take 500 mg by mouth 2 (two) times daily.      aspirin 81 MG tablet Take 81 mg by mouth daily.     Coenzyme Q10 (CO Q 10 PO) Take 1 tablet by mouth daily.     Multiple Vitamin (MULITIVITAMIN WITH MINERALS) TABS Take 1 tablet by mouth daily.     NITROSTAT 0.4 MG SL tablet DISSOLVE ONE TABLET UNDERTONGUE AS DIRECTED AS    NEEDED FOR CHEST PAIN 25 tablet 0   polyethylene glycol (MIRALAX / GLYCOLAX) packet Take 17 g by mouth every other day.      Probiotic Product (ALIGN PO) Take 1 capsule by mouth as needed.      rosuvastatin (CRESTOR) 5 MG tablet Take 5 mg by mouth at bedtime.     traMADol (ULTRAM) 50 MG tablet Take 50 mg by mouth 2 (two) times daily as needed for moderate pain.      No current facility-administered medications for this visit.    REVIEW OF SYSTEMS:   .10 Point review of Systems was done is negative except as noted above.  PHYSICAL EXAMINATION: ECOG PERFORMANCE STATUS: 2 -  Symptomatic, <50% confined to bed   Vitals:   04/20/21 1425  BP: 140/85  Pulse: 81  Resp: 18  Temp: 98.1 F (36.7 C)  SpO2: 98%   Filed Weights   04/20/21 1425  Weight: 156 lb (70.8 kg)   Body mass index is 27.63 kg/m.  Marland Kitchen GENERAL:alert, in no acute distress and comfortable SKIN: no acute rashes, no significant lesions EYES: conjunctiva are pink and non-injected, sclera anicteric OROPHARYNX: MMM, no exudates, no oropharyngeal erythema or ulceration NECK: supple, no JVD LYMPH:  no palpable lymphadenopathy in the cervical, axillary or inguinal regions LUNGS: clear to auscultation b/l with normal respiratory effort HEART: regular rate & rhythm ABDOMEN:  normoactive bowel sounds , non tender, not distended. Extremity: no pedal edema PSYCH: alert & oriented x 3 with fluent speech NEURO: no focal motor/sensory deficits   LABORATORY DATA:  I have reviewed the data as listed  . CBC Latest Ref Rng & Units 04/20/2021 10/19/2020 04/21/2020  WBC 4.0 - 10.5 K/uL 17.2(H) 19.2(H) 17.8(H)  Hemoglobin 12.0 - 15.0 g/dL 11.6(L) 11.5(L) 11.8(L)  Hematocrit 36.0 - 46.0 % 34.3(L) 35.5(L) 35.2(L)  Platelets 150 - 400 K/uL 297 324 301   . CBC    Component Value Date/Time   WBC 17.2 (H) 04/20/2021 1401   RBC 3.72 (L) 04/20/2021 1401   HGB 11.6 (L) 04/20/2021 1401   HGB 11.3 (L) 04/25/2018 1458   HGB 12.2 04/26/2017 1344   HCT 34.3 (L) 04/20/2021 1401   HCT 36.3 04/26/2017 1344   PLT 297 04/20/2021 1401   PLT 316 04/25/2018 1458   PLT 296 04/26/2017 1344   MCV 92.2 04/20/2021 1401   MCV 94.3 04/26/2017 1344   MCH 31.2 04/20/2021 1401   MCHC 33.8 04/20/2021 1401   RDW 14.6 04/20/2021 1401   RDW 15.4 (H) 04/26/2017 1344   LYMPHSABS 11.8 (H) 04/20/2021 1401   LYMPHSABS 7.8 (H) 04/26/2017 1344   MONOABS 1.1 (H) 04/20/2021 1401   MONOABS 0.8 04/26/2017 1344   EOSABS 0.1 04/20/2021 1401   EOSABS 0.1 04/26/2017 1344   BASOSABS 0.1 04/20/2021 1401   BASOSABS 0.0 04/26/2017 1344    . CMP Latest Ref Rng & Units 04/20/2021 10/19/2020 04/21/2020  Glucose 70 - 99 mg/dL 93 96 82  BUN 8 - 23 mg/dL '19 19 17  '$ Creatinine 0.44 - 1.00 mg/dL 1.20(H) 1.04(H) 1.08(H)  Sodium 135 - 145 mmol/L 140 136 140  Potassium 3.5 - 5.1 mmol/L 4.7 4.6 4.5  Chloride 98 - 111 mmol/L 105 102 107  CO2 22 - 32 mmol/L '26 26 25  '$ Calcium 8.9 - 10.3 mg/dL 10.5(H) 9.8 10.1  Total Protein 6.5 - 8.1 g/dL 6.8 6.8 6.6  Total Bilirubin 0.3 - 1.2 mg/dL 0.5 0.5 0.4  Alkaline Phos 38 - 126 U/L 72 77 73  AST 15 - 41 U/L '17 21 21  '$ ALT 0 - 44 U/L '10 15 11   '$ . Lab Results  Component Value Date   LDH 159 04/20/2021             RADIOGRAPHIC STUDIES: I have personally reviewed the radiological images as listed and agreed with the findings in the report. No results found.   ASSESSMENT & PLAN:   87 y.o. female with   1) Rai Stage 1 Chronic lymphocytic leukemia. Apparently was previously diagnosed in March 2011 but no data available from this. CLL Fish prognostic panel showed predominantly 13q deletion in 63% of the cells. This typically suggests a good prognosis small clone with trisomy 37 which is an intermediate prognosis.   #2) Shingles- Resolved. -PCP could consider referral to Interventional pain specialist for nerve block for post-herpetic neuralgias     PLAN: -Discussed pt labwork today, 04/20/2021 blood counts stable, chemistries stable. LDH normal.  -Recommended pt receive MRI or CT scan of head. Advised pt to f/u w PCP regarding blood testing for irreversible memory loss.  -No lab or clinical evidence of significant CLL progression at this time. No indication to begin treatment. Will continue watchful observation. -Will see back in 6 months with labs.   FOLLOW UP: RTC w Dr Irene Limbo with labs in 6 months    . The total time  spent in the appointment was 20 minutes and more than 50% was on counseling and direct patient cares.    All of the patient's questions were answered with apparent  satisfaction. The patient knows to call the clinic with any problems, questions or concerns.    Sullivan Lone MD [place] AAHIVMS [facility] Hematology/Oncology Physician [facility]  (Office):       [phone] (Work cell):  [phone] (Fax):           [phone]

## 2021-05-27 DIAGNOSIS — Z23 Encounter for immunization: Secondary | ICD-10-CM | POA: Diagnosis not present

## 2021-07-06 ENCOUNTER — Emergency Department (HOSPITAL_COMMUNITY): Payer: Medicare Other

## 2021-07-06 ENCOUNTER — Inpatient Hospital Stay (HOSPITAL_COMMUNITY)
Admission: EM | Admit: 2021-07-06 | Discharge: 2021-07-09 | DRG: 481 | Disposition: A | Discharge status: Skilled Nursing Facility | Payer: Medicare Other | Attending: Obstetrics and Gynecology | Admitting: Obstetrics and Gynecology

## 2021-07-06 ENCOUNTER — Encounter (HOSPITAL_COMMUNITY): Payer: Self-pay

## 2021-07-06 ENCOUNTER — Other Ambulatory Visit: Payer: Self-pay

## 2021-07-06 DIAGNOSIS — Y92038 Other place in apartment as the place of occurrence of the external cause: Secondary | ICD-10-CM | POA: Diagnosis not present

## 2021-07-06 DIAGNOSIS — M25552 Pain in left hip: Secondary | ICD-10-CM | POA: Diagnosis not present

## 2021-07-06 DIAGNOSIS — Z66 Do not resuscitate: Secondary | ICD-10-CM | POA: Diagnosis present

## 2021-07-06 DIAGNOSIS — E559 Vitamin D deficiency, unspecified: Secondary | ICD-10-CM | POA: Diagnosis present

## 2021-07-06 DIAGNOSIS — S72002A Fracture of unspecified part of neck of left femur, initial encounter for closed fracture: Secondary | ICD-10-CM | POA: Diagnosis not present

## 2021-07-06 DIAGNOSIS — Z87891 Personal history of nicotine dependence: Secondary | ICD-10-CM | POA: Diagnosis not present

## 2021-07-06 DIAGNOSIS — D509 Iron deficiency anemia, unspecified: Secondary | ICD-10-CM | POA: Diagnosis present

## 2021-07-06 DIAGNOSIS — K219 Gastro-esophageal reflux disease without esophagitis: Secondary | ICD-10-CM | POA: Diagnosis present

## 2021-07-06 DIAGNOSIS — Z888 Allergy status to other drugs, medicaments and biological substances status: Secondary | ICD-10-CM | POA: Diagnosis not present

## 2021-07-06 DIAGNOSIS — Z7982 Long term (current) use of aspirin: Secondary | ICD-10-CM | POA: Diagnosis not present

## 2021-07-06 DIAGNOSIS — S72145A Nondisplaced intertrochanteric fracture of left femur, initial encounter for closed fracture: Secondary | ICD-10-CM | POA: Diagnosis not present

## 2021-07-06 DIAGNOSIS — M1712 Unilateral primary osteoarthritis, left knee: Secondary | ICD-10-CM | POA: Diagnosis not present

## 2021-07-06 DIAGNOSIS — Z882 Allergy status to sulfonamides status: Secondary | ICD-10-CM

## 2021-07-06 DIAGNOSIS — E7800 Pure hypercholesterolemia, unspecified: Secondary | ICD-10-CM | POA: Diagnosis not present

## 2021-07-06 DIAGNOSIS — I2510 Atherosclerotic heart disease of native coronary artery without angina pectoris: Secondary | ICD-10-CM | POA: Diagnosis present

## 2021-07-06 DIAGNOSIS — R410 Disorientation, unspecified: Secondary | ICD-10-CM | POA: Diagnosis not present

## 2021-07-06 DIAGNOSIS — S79912A Unspecified injury of left hip, initial encounter: Secondary | ICD-10-CM | POA: Diagnosis not present

## 2021-07-06 DIAGNOSIS — I130 Hypertensive heart and chronic kidney disease with heart failure and stage 1 through stage 4 chronic kidney disease, or unspecified chronic kidney disease: Secondary | ICD-10-CM | POA: Diagnosis present

## 2021-07-06 DIAGNOSIS — S72142A Displaced intertrochanteric fracture of left femur, initial encounter for closed fracture: Secondary | ICD-10-CM | POA: Diagnosis not present

## 2021-07-06 DIAGNOSIS — Z043 Encounter for examination and observation following other accident: Secondary | ICD-10-CM | POA: Diagnosis not present

## 2021-07-06 DIAGNOSIS — M8580 Other specified disorders of bone density and structure, unspecified site: Secondary | ICD-10-CM | POA: Diagnosis present

## 2021-07-06 DIAGNOSIS — Z8719 Personal history of other diseases of the digestive system: Secondary | ICD-10-CM

## 2021-07-06 DIAGNOSIS — W010XXA Fall on same level from slipping, tripping and stumbling without subsequent striking against object, initial encounter: Secondary | ICD-10-CM | POA: Diagnosis present

## 2021-07-06 DIAGNOSIS — R0902 Hypoxemia: Secondary | ICD-10-CM | POA: Diagnosis not present

## 2021-07-06 DIAGNOSIS — Z880 Allergy status to penicillin: Secondary | ICD-10-CM | POA: Diagnosis not present

## 2021-07-06 DIAGNOSIS — I959 Hypotension, unspecified: Secondary | ICD-10-CM | POA: Diagnosis not present

## 2021-07-06 DIAGNOSIS — N1831 Chronic kidney disease, stage 3a: Secondary | ICD-10-CM | POA: Diagnosis not present

## 2021-07-06 DIAGNOSIS — R9431 Abnormal electrocardiogram [ECG] [EKG]: Secondary | ICD-10-CM | POA: Diagnosis not present

## 2021-07-06 DIAGNOSIS — I7121 Aneurysm of the ascending aorta, without rupture: Secondary | ICD-10-CM | POA: Diagnosis present

## 2021-07-06 DIAGNOSIS — S72302D Unspecified fracture of shaft of left femur, subsequent encounter for closed fracture with routine healing: Secondary | ICD-10-CM | POA: Diagnosis not present

## 2021-07-06 DIAGNOSIS — Z79899 Other long term (current) drug therapy: Secondary | ICD-10-CM

## 2021-07-06 DIAGNOSIS — Z743 Need for continuous supervision: Secondary | ICD-10-CM | POA: Diagnosis not present

## 2021-07-06 DIAGNOSIS — Z20822 Contact with and (suspected) exposure to covid-19: Secondary | ICD-10-CM | POA: Diagnosis not present

## 2021-07-06 DIAGNOSIS — I700 Atherosclerosis of aorta: Secondary | ICD-10-CM | POA: Diagnosis not present

## 2021-07-06 DIAGNOSIS — Z881 Allergy status to other antibiotic agents status: Secondary | ICD-10-CM | POA: Diagnosis not present

## 2021-07-06 DIAGNOSIS — I5032 Chronic diastolic (congestive) heart failure: Secondary | ICD-10-CM | POA: Diagnosis not present

## 2021-07-06 DIAGNOSIS — I361 Nonrheumatic tricuspid (valve) insufficiency: Secondary | ICD-10-CM | POA: Diagnosis not present

## 2021-07-06 DIAGNOSIS — S72009A Fracture of unspecified part of neck of unspecified femur, initial encounter for closed fracture: Secondary | ICD-10-CM

## 2021-07-06 DIAGNOSIS — W01198A Fall on same level from slipping, tripping and stumbling with subsequent striking against other object, initial encounter: Secondary | ICD-10-CM | POA: Diagnosis not present

## 2021-07-06 DIAGNOSIS — C9110 Chronic lymphocytic leukemia of B-cell type not having achieved remission: Secondary | ICD-10-CM | POA: Diagnosis present

## 2021-07-06 DIAGNOSIS — M1990 Unspecified osteoarthritis, unspecified site: Secondary | ICD-10-CM | POA: Diagnosis present

## 2021-07-06 DIAGNOSIS — N183 Chronic kidney disease, stage 3 unspecified: Secondary | ICD-10-CM | POA: Diagnosis not present

## 2021-07-06 DIAGNOSIS — I350 Nonrheumatic aortic (valve) stenosis: Secondary | ICD-10-CM | POA: Diagnosis not present

## 2021-07-06 DIAGNOSIS — E785 Hyperlipidemia, unspecified: Secondary | ICD-10-CM | POA: Diagnosis present

## 2021-07-06 DIAGNOSIS — W19XXXA Unspecified fall, initial encounter: Secondary | ICD-10-CM | POA: Diagnosis not present

## 2021-07-06 DIAGNOSIS — I719 Aortic aneurysm of unspecified site, without rupture: Secondary | ICD-10-CM | POA: Diagnosis present

## 2021-07-06 DIAGNOSIS — Y93E9 Activity, other interior property and clothing maintenance: Secondary | ICD-10-CM

## 2021-07-06 MED ORDER — FENTANYL CITRATE PF 50 MCG/ML IJ SOSY
50.0000 ug | PREFILLED_SYRINGE | INTRAMUSCULAR | Status: AC | PRN
  Administered 2021-07-06 – 2021-07-07 (×2): 50 ug via INTRAVENOUS
  Filled 2021-07-06 (×2): qty 1

## 2021-07-06 MED ORDER — ONDANSETRON HCL 4 MG/2ML IJ SOLN
4.0000 mg | Freq: Once | INTRAMUSCULAR | Status: AC
  Administered 2021-07-06: 4 mg via INTRAVENOUS
  Filled 2021-07-06: qty 2

## 2021-07-06 MED ORDER — SODIUM CHLORIDE 0.9 % IV SOLN: INTRAVENOUS | Status: DC

## 2021-07-06 NOTE — ED Provider Notes (Signed)
[place] DEPT Provider Note: Georgena Spurling, MD, FACEP  CSN: [id number] MRN: [id number] ARRIVAL: 07/06/21 at 2238 ROOM: RESA/RESA   CHIEF COMPLAINT  Hip Injury   HISTORY OF PRESENT ILLNESS  07/06/21 10:51 PM Rebecca King is a 87 y.o. female lives at home with her husband.  She was setting the table about 2 hours ago and slipped on the rug and fell.  She is having pain in her left hip and left knee.  She rates her pain as a 10 out of 10, worse with movement.  The left lower extremity is shortened.  She was given 50 mcg of fentanyl by EMS prior to arrival with limited relief.   Past Medical History:  Diagnosis Date   Abnormal nuclear stress test 01/26/2009   POST EXERCISE PVC's NOTED SINCE 2003   CLL (chronic lymphocytic leukemia) ([place]) 10/2009   DR. KALE   Colon polyp    HYPERPLASTIC   Diverticulosis    DJD (degenerative joint disease)    GERD (gastroesophageal reflux disease)    Hearing loss    DR. SHOEMAKER   Hypercholesterolemia    Hyperlipidemia    Iron deficiency anemia 05/2010   Lesion of breast    LEFT, SMALL   Microscopic hematuria    DR. PETERSON   Neuropathy    PERIPHERAL   Osteopenia    Renal cyst 10/2009   RENAL CYST ON CT 11/2009   Renal disease    STAGE 3   Tendon cysts    EXTENSOR 2MM RIGHT FIRST FINGER   Vitamin D deficiency 10/2011    Past Surgical History:  Procedure Laterality Date   LEFT HEART CATHETERIZATION WITH CORONARY ANGIOGRAM N/A 12/25/2011   Procedure: LEFT HEART CATHETERIZATION WITH CORONARY ANGIOGRAM;  Surgeon: Sinclair Grooms, MD;  Location: [facility] CATH LAB;  Service: Cardiovascular;  Laterality: N/A;    Family History  Family history unknown: Yes    Social History   Tobacco Use   Smoking status: Former    Types: Cigarettes    Quit date: 06/25/1980    Years since quitting: 41.0   Smokeless tobacco: Never  Vaping Use   Vaping Use: Never used  Substance Use Topics   Alcohol use: Yes   Drug use: No    Prior to  Admission medications   Medication Sig Start Date End Date Taking? Authorizing Provider  acetaminophen (TYLENOL) 650 MG CR tablet Take 650 mg by mouth 2 (two) times daily.   Yes [provider]  aspirin 81 MG tablet Take 81 mg by mouth daily.   Yes [provider]  Coenzyme Q10 (CO Q 10 PO) Take 1 tablet by mouth daily.   Yes [provider]  Multiple Vitamin (MULITIVITAMIN WITH MINERALS) TABS Take 1 tablet by mouth daily.   Yes [provider]  Probiotic Product (ALIGN PO) Take 1 capsule by mouth as needed.    Yes [provider]  rosuvastatin (CRESTOR) 5 MG tablet Take 5 mg by mouth at bedtime.   Yes [provider]  traMADol (ULTRAM) 50 MG tablet Take 50 mg by mouth 2 (two) times daily as needed for moderate pain.    Yes [provider]  NITROSTAT 0.4 MG SL tablet DISSOLVE ONE TABLET UNDERTONGUE AS DIRECTED AS    NEEDED FOR CHEST PAIN Patient not taking: Reported on 07/06/2021 08/31/13   Belva Crome, MD  polyethylene glycol [facility] Floria Raveling) packet Take 17 g by mouth every other day.  [provider]    Allergies Amoxicillin, Macrolides and ketolides, Metoprolol, Ciprofloxacin, Macrodantin [nitrofurantoin], and Sulfa antibiotics   REVIEW OF SYSTEMS  Negative except as noted here or in the History of Present Illness.   PHYSICAL EXAMINATION  Initial Vital Signs Blood pressure (!) 149/92, pulse 77, temperature 97.6 F (36.4 C), temperature source Oral, resp. rate 13, SpO2 99 %.  Examination General: Well-developed, well-nourished female in no acute distress; appearance consistent with age of record HENT: normocephalic; atraumatic Eyes: pupils equal, round and reactive to light; extraocular muscles intact Neck: supple Heart: regular rate and rhythm Lungs: clear to auscultation bilaterally Abdomen: soft; nondistended; nontender; bowel sounds present Extremities: Shortening of the left lower extremity;  pain on palpation of left knee; pain on passive movement of left hip Neurologic: Awake, alert and oriented; motor function intact in all extremities and symmetric; no facial droop Skin: Warm and dry Psychiatric: Grimacing   RESULTS  Summary of this visit's results, reviewed and interpreted by myself:   EKG Interpretation  Date/Time:  Wednesday July 06 2021 22:52:44 EST Ventricular Rate:  75 PR Interval:  275 QRS Duration: 89 QT Interval:  402 QTC Calculation: 449 R Axis:   24 Text Interpretation: Sinus rhythm Prolonged PR interval Artifact No significant change was found Confirmed by Catilyn Boggus, Jenny Reichmann [id number]) on 07/06/2021 10:55:06 PM       Laboratory Studies: Results for orders placed or performed during the hospital encounter of 07/06/21 (from the past 24 hour(s))  Basic metabolic panel     Status: Abnormal   Collection Time: 07/06/21 11:35 PM  Result Value Ref Range   Sodium 136 135 - 145 mmol/L   Potassium 4.3 3.5 - 5.1 mmol/L   Chloride 105 98 - 111 mmol/L   CO2 25 22 - 32 mmol/L   Glucose, Bld 115 (H) 70 - 99 mg/dL   BUN 25 (H) 8 - 23 mg/dL   Creatinine, Ser 0.95 0.44 - 1.00 mg/dL   Calcium 9.4 8.9 - 10.3 mg/dL   GFR, Estimated 56 (L) >60 mL/min   Anion gap 6 5 - 15  CBC WITH DIFFERENTIAL     Status: Abnormal (Preliminary result)   Collection Time: 07/06/21 11:35 PM  Result Value Ref Range   WBC 18.2 (H) 4.0 - 10.5 K/uL   RBC 3.45 (L) 3.87 - 5.11 MIL/uL   Hemoglobin 10.9 (L) 12.0 - 15.0 g/dL   HCT 32.6 (L) 36.0 - 46.0 %   MCV 94.5 80.0 - 100.0 fL   MCH 31.6 26.0 - 34.0 pg   MCHC 33.4 30.0 - 36.0 g/dL   RDW 14.9 11.5 - 15.5 %   Platelets 260 150 - 400 K/uL   nRBC 0.0 0.0 - 0.2 %   Neutrophils Relative % PENDING %   Neutro Abs PENDING 1.7 - 7.7 K/uL   Band Neutrophils PENDING %   Lymphocytes Relative PENDING %   Lymphs Abs PENDING 0.7 - 4.0 K/uL   Monocytes Relative PENDING %   Monocytes Absolute PENDING 0.1 - 1.0 K/uL   Eosinophils Relative PENDING %    Eosinophils Absolute PENDING 0.0 - 0.5 K/uL   Basophils Relative PENDING %   Basophils Absolute PENDING 0.0 - 0.1 K/uL   WBC Morphology PENDING    RBC Morphology PENDING    Smear Review PENDING    Other PENDING %   nRBC PENDING 0 /100 WBC   Metamyelocytes Relative PENDING %   Myelocytes PENDING %   Promyelocytes Relative PENDING %   Blasts  PENDING %   Immature Granulocytes PENDING %   Abs Immature Granulocytes PENDING 0.00 - 0.07 K/uL  Protime-INR     Status: None   Collection Time: 07/06/21 11:35 PM  Result Value Ref Range   Prothrombin Time 12.6 11.4 - 15.2 seconds   INR 0.9 0.8 - 1.2  Type and screen Arthur     Status: None   Collection Time: 07/06/21 11:35 PM  Result Value Ref Range   ABO/RH(D) B POS    Antibody Screen NEG    Sample Expiration      07/09/2021,2359 Performed at [facility], [place] [address] [place], [place] [address]   Resp Panel by RT-PCR (Flu A&B, Covid) Nasopharyngeal Swab     Status: None   Collection Time: 07/06/21 11:35 PM   Specimen: Nasopharyngeal Swab; Nasopharyngeal(NP) swabs in vial transport medium  Result Value Ref Range   SARS Coronavirus 2 by RT PCR NEGATIVE NEGATIVE   Influenza A by PCR NEGATIVE NEGATIVE   Influenza B by PCR NEGATIVE NEGATIVE   Imaging Studies: DG Chest 1 View  Result Date: 07/06/2021 CLINICAL DATA:  Fall with hip fracture EXAM: CHEST  1 VIEW COMPARISON:  02/26/2020 FINDINGS: No focal opacity or pleural effusion. Stable cardiomediastinal silhouette with aortic atherosclerosis. No pneumothorax. IMPRESSION: No active disease. Electronically Signed   By: Donavan Foil M.D.   On: 07/06/2021 23:32   DG Knee Complete 4 Views Left  Result Date: 07/06/2021 CLINICAL DATA:  Recent fall with knee pain, initial encounter EXAM: LEFT KNEE - COMPLETE 4+ VIEW COMPARISON:  None. FINDINGS: Degenerative changes of the left knee joint are seen. No acute fracture or dislocation is seen. Patella is  within normal limits. No joint effusion is noted. IMPRESSION: Tricompartmental degenerative changes without acute abnormality. Electronically Signed   By: Inez Catalina M.D.   On: 07/06/2021 23:41   DG Hip Unilat With Pelvis 2-3 Views Left  Result Date: 07/06/2021 CLINICAL DATA:  Fall hip pain EXAM: DG HIP (WITH OR WITHOUT PELVIS) 2-3V LEFT COMPARISON:  None. FINDINGS: SI joints are non widened. Pubic symphysis and rami appear intact. Acute nondisplaced intertrochanteric fracture on the left. No femoral head dislocation IMPRESSION: Acute left intertrochanteric fracture Electronically Signed   By: Donavan Foil M.D.   On: 07/06/2021 23:31    ED COURSE and MDM  Nursing notes, initial and subsequent vitals signs, including pulse oximetry, reviewed and interpreted by myself.  Vitals:   07/06/21 2250 07/07/21 0000  BP: (!) 149/92 132/66  Pulse: 77 74  Resp: 13 15  Temp: 97.6 F (36.4 C)   TempSrc: Oral   SpO2: 99% 97%   Medications  0.9 %  sodium chloride infusion ( Intravenous New Bag/Given 07/06/21 2344)  fentaNYL (SUBLIMAZE) injection 50 mcg (50 mcg Intravenous Given 07/07/21 0022)  ondansetron (ZOFRAN) injection 4 mg (4 mg Intravenous Given 07/06/21 2256)   12:22 AM Dr. Alvan Dame of orthopedics consulted for the patient's hip fracture.  Will have patient admitted to the hospitalist service.  12:41 AM Dr. Roel Cluck to admit to hospitalist service.  PROCEDURES  Procedures   ED DIAGNOSES     ICD-10-CM   1. Intertrochanteric fracture of left femur, closed, initial encounter ([place])  S72.142A     2. Fall from slip, trip, or stumble, initial encounter  W01.Margaretmary Lombard, MD 07/07/21 [phone]

## 2021-07-06 NOTE — ED Triage Notes (Signed)
Pt. BIB [facility] c/o mechanical fall to L.hip. Pain on palpation to the L. Hip given 50 micros of Fentanyl on scene and 400 ml NS.  EMS VS: BP:114/57 HR: 68 O2: 99% RA RR: 16

## 2021-07-07 ENCOUNTER — Inpatient Hospital Stay (HOSPITAL_COMMUNITY): Payer: Medicare Other | Admitting: Anesthesiology

## 2021-07-07 ENCOUNTER — Encounter (HOSPITAL_COMMUNITY): Payer: Self-pay | Admitting: Internal Medicine

## 2021-07-07 ENCOUNTER — Inpatient Hospital Stay (HOSPITAL_COMMUNITY): Payer: Medicare Other

## 2021-07-07 ENCOUNTER — Other Ambulatory Visit (HOSPITAL_COMMUNITY): Payer: Medicare Other

## 2021-07-07 ENCOUNTER — Encounter (HOSPITAL_COMMUNITY)
Admission: EM | Disposition: A | Discharge status: Skilled Nursing Facility | Payer: Self-pay | Source: Home / Self Care | Attending: Obstetrics and Gynecology

## 2021-07-07 DIAGNOSIS — Z8719 Personal history of other diseases of the digestive system: Secondary | ICD-10-CM | POA: Diagnosis not present

## 2021-07-07 DIAGNOSIS — D509 Iron deficiency anemia, unspecified: Secondary | ICD-10-CM

## 2021-07-07 DIAGNOSIS — N1831 Chronic kidney disease, stage 3a: Secondary | ICD-10-CM

## 2021-07-07 DIAGNOSIS — Y93E9 Activity, other interior property and clothing maintenance: Secondary | ICD-10-CM | POA: Diagnosis not present

## 2021-07-07 DIAGNOSIS — K219 Gastro-esophageal reflux disease without esophagitis: Secondary | ICD-10-CM | POA: Diagnosis present

## 2021-07-07 DIAGNOSIS — E785 Hyperlipidemia, unspecified: Secondary | ICD-10-CM

## 2021-07-07 DIAGNOSIS — I350 Nonrheumatic aortic (valve) stenosis: Secondary | ICD-10-CM | POA: Diagnosis not present

## 2021-07-07 DIAGNOSIS — I130 Hypertensive heart and chronic kidney disease with heart failure and stage 1 through stage 4 chronic kidney disease, or unspecified chronic kidney disease: Secondary | ICD-10-CM | POA: Diagnosis present

## 2021-07-07 DIAGNOSIS — I5032 Chronic diastolic (congestive) heart failure: Secondary | ICD-10-CM | POA: Diagnosis present

## 2021-07-07 DIAGNOSIS — Z880 Allergy status to penicillin: Secondary | ICD-10-CM | POA: Diagnosis not present

## 2021-07-07 DIAGNOSIS — I7121 Aneurysm of the ascending aorta, without rupture: Secondary | ICD-10-CM | POA: Diagnosis present

## 2021-07-07 DIAGNOSIS — M8580 Other specified disorders of bone density and structure, unspecified site: Secondary | ICD-10-CM | POA: Diagnosis present

## 2021-07-07 DIAGNOSIS — Z20822 Contact with and (suspected) exposure to covid-19: Secondary | ICD-10-CM | POA: Diagnosis present

## 2021-07-07 DIAGNOSIS — I2510 Atherosclerotic heart disease of native coronary artery without angina pectoris: Secondary | ICD-10-CM | POA: Diagnosis present

## 2021-07-07 DIAGNOSIS — N183 Chronic kidney disease, stage 3 unspecified: Secondary | ICD-10-CM | POA: Diagnosis not present

## 2021-07-07 DIAGNOSIS — I719 Aortic aneurysm of unspecified site, without rupture: Secondary | ICD-10-CM | POA: Diagnosis present

## 2021-07-07 DIAGNOSIS — W010XXA Fall on same level from slipping, tripping and stumbling without subsequent striking against object, initial encounter: Secondary | ICD-10-CM

## 2021-07-07 DIAGNOSIS — Y92038 Other place in apartment as the place of occurrence of the external cause: Secondary | ICD-10-CM | POA: Diagnosis not present

## 2021-07-07 DIAGNOSIS — M1990 Unspecified osteoarthritis, unspecified site: Secondary | ICD-10-CM | POA: Diagnosis present

## 2021-07-07 DIAGNOSIS — Z743 Need for continuous supervision: Secondary | ICD-10-CM | POA: Diagnosis not present

## 2021-07-07 DIAGNOSIS — Z7982 Long term (current) use of aspirin: Secondary | ICD-10-CM | POA: Diagnosis not present

## 2021-07-07 DIAGNOSIS — C9110 Chronic lymphocytic leukemia of B-cell type not having achieved remission: Secondary | ICD-10-CM | POA: Diagnosis present

## 2021-07-07 DIAGNOSIS — S72002A Fracture of unspecified part of neck of left femur, initial encounter for closed fracture: Secondary | ICD-10-CM | POA: Diagnosis present

## 2021-07-07 DIAGNOSIS — Z87891 Personal history of nicotine dependence: Secondary | ICD-10-CM | POA: Diagnosis not present

## 2021-07-07 DIAGNOSIS — W01198A Fall on same level from slipping, tripping and stumbling with subsequent striking against other object, initial encounter: Secondary | ICD-10-CM | POA: Diagnosis not present

## 2021-07-07 DIAGNOSIS — E7800 Pure hypercholesterolemia, unspecified: Secondary | ICD-10-CM | POA: Diagnosis present

## 2021-07-07 DIAGNOSIS — S72142A Displaced intertrochanteric fracture of left femur, initial encounter for closed fracture: Secondary | ICD-10-CM | POA: Diagnosis not present

## 2021-07-07 DIAGNOSIS — Z882 Allergy status to sulfonamides status: Secondary | ICD-10-CM | POA: Diagnosis not present

## 2021-07-07 DIAGNOSIS — I361 Nonrheumatic tricuspid (valve) insufficiency: Secondary | ICD-10-CM | POA: Diagnosis not present

## 2021-07-07 DIAGNOSIS — Z66 Do not resuscitate: Secondary | ICD-10-CM | POA: Diagnosis present

## 2021-07-07 DIAGNOSIS — Z881 Allergy status to other antibiotic agents status: Secondary | ICD-10-CM | POA: Diagnosis not present

## 2021-07-07 DIAGNOSIS — Z888 Allergy status to other drugs, medicaments and biological substances status: Secondary | ICD-10-CM | POA: Diagnosis not present

## 2021-07-07 DIAGNOSIS — S72145A Nondisplaced intertrochanteric fracture of left femur, initial encounter for closed fracture: Secondary | ICD-10-CM | POA: Diagnosis present

## 2021-07-07 DIAGNOSIS — S72302D Unspecified fracture of shaft of left femur, subsequent encounter for closed fracture with routine healing: Secondary | ICD-10-CM | POA: Diagnosis not present

## 2021-07-07 DIAGNOSIS — R410 Disorientation, unspecified: Secondary | ICD-10-CM | POA: Diagnosis not present

## 2021-07-07 DIAGNOSIS — F09 Unspecified mental disorder due to known physiological condition: Secondary | ICD-10-CM | POA: Insufficient documentation

## 2021-07-07 DIAGNOSIS — E559 Vitamin D deficiency, unspecified: Secondary | ICD-10-CM | POA: Diagnosis present

## 2021-07-07 HISTORY — PX: INTRAMEDULLARY (IM) NAIL INTERTROCHANTERIC: SHX5875

## 2021-07-07 LAB — CK: Total CK: 86 U/L (ref 38–234)

## 2021-07-07 LAB — PROTIME-INR
INR: 0.9 (ref 0.8–1.2)
Prothrombin Time: 12.6 seconds (ref 11.4–15.2)

## 2021-07-07 LAB — CBC WITH DIFFERENTIAL/PLATELET
Abs Immature Granulocytes: 0 10*3/uL (ref 0.00–0.07)
Abs Immature Granulocytes: 0 10*3/uL (ref 0.00–0.07)
Basophils Absolute: 0 10*3/uL (ref 0.0–0.1)
Basophils Absolute: 0.2 10*3/uL — ABNORMAL HIGH (ref 0.0–0.1)
Basophils Relative: 0 %
Basophils Relative: 1 %
Eosinophils Absolute: 0 10*3/uL (ref 0.0–0.5)
Eosinophils Absolute: 0.2 10*3/uL (ref 0.0–0.5)
Eosinophils Relative: 0 %
Eosinophils Relative: 1 %
HCT: 31.2 % — ABNORMAL LOW (ref 36.0–46.0)
HCT: 32.6 % — ABNORMAL LOW (ref 36.0–46.0)
Hemoglobin: 10.3 g/dL — ABNORMAL LOW (ref 12.0–15.0)
Hemoglobin: 10.9 g/dL — ABNORMAL LOW (ref 12.0–15.0)
Lymphocytes Relative: 25 %
Lymphocytes Relative: 27 %
Lymphs Abs: 4.6 10*3/uL — ABNORMAL HIGH (ref 0.7–4.0)
Lymphs Abs: 4.7 10*3/uL — ABNORMAL HIGH (ref 0.7–4.0)
MCH: 31.1 pg (ref 26.0–34.0)
MCH: 31.6 pg (ref 26.0–34.0)
MCHC: 33 g/dL (ref 30.0–36.0)
MCHC: 33.4 g/dL (ref 30.0–36.0)
MCV: 94.3 fL (ref 80.0–100.0)
MCV: 94.5 fL (ref 80.0–100.0)
Monocytes Absolute: 0.9 10*3/uL (ref 0.1–1.0)
Monocytes Absolute: 2 10*3/uL — ABNORMAL HIGH (ref 0.1–1.0)
Monocytes Relative: 11 %
Monocytes Relative: 5 %
Neutro Abs: 11.3 10*3/uL — ABNORMAL HIGH (ref 1.7–7.7)
Neutro Abs: 11.8 10*3/uL — ABNORMAL HIGH (ref 1.7–7.7)
Neutrophils Relative %: 62 %
Neutrophils Relative %: 68 %
Platelets: 247 10*3/uL (ref 150–400)
Platelets: 260 10*3/uL (ref 150–400)
RBC: 3.31 MIL/uL — ABNORMAL LOW (ref 3.87–5.11)
RBC: 3.45 MIL/uL — ABNORMAL LOW (ref 3.87–5.11)
RDW: 14.8 % (ref 11.5–15.5)
RDW: 14.9 % (ref 11.5–15.5)
WBC: 17.3 10*3/uL — ABNORMAL HIGH (ref 4.0–10.5)
WBC: 18.2 10*3/uL — ABNORMAL HIGH (ref 4.0–10.5)
nRBC: 0 % (ref 0.0–0.2)
nRBC: 0 % (ref 0.0–0.2)

## 2021-07-07 LAB — IRON AND TIBC
Iron: 37 ug/dL (ref 28–170)
Saturation Ratios: 13 % (ref 10.4–31.8)
TIBC: 283 ug/dL (ref 250–450)
UIBC: 246 ug/dL

## 2021-07-07 LAB — BASIC METABOLIC PANEL
Anion gap: 6 (ref 5–15)
Anion gap: 6 (ref 5–15)
BUN: 21 mg/dL (ref 8–23)
BUN: 25 mg/dL — ABNORMAL HIGH (ref 8–23)
CO2: 22 mmol/L (ref 22–32)
CO2: 25 mmol/L (ref 22–32)
Calcium: 9 mg/dL (ref 8.9–10.3)
Calcium: 9.4 mg/dL (ref 8.9–10.3)
Chloride: 105 mmol/L (ref 98–111)
Chloride: 109 mmol/L (ref 98–111)
Creatinine, Ser: 0.87 mg/dL (ref 0.44–1.00)
Creatinine, Ser: 0.95 mg/dL (ref 0.44–1.00)
GFR, Estimated: 56 mL/min — ABNORMAL LOW (ref >60)
GFR, Estimated: >60 mL/min (ref >60)
Glucose, Bld: 113 mg/dL — ABNORMAL HIGH (ref 70–99)
Glucose, Bld: 115 mg/dL — ABNORMAL HIGH (ref 70–99)
Potassium: 4.3 mmol/L (ref 3.5–5.1)
Potassium: 4.4 mmol/L (ref 3.5–5.1)
Sodium: 136 mmol/L (ref 135–145)
Sodium: 137 mmol/L (ref 135–145)

## 2021-07-07 LAB — RESP PANEL BY RT-PCR (FLU A&B, COVID) ARPGX2
Influenza A by PCR: NEGATIVE
Influenza B by PCR: NEGATIVE
SARS Coronavirus 2 by RT PCR: NEGATIVE

## 2021-07-07 LAB — HEPATIC FUNCTION PANEL
ALT: 15 U/L (ref 0–44)
AST: 21 U/L (ref 15–41)
Albumin: 3.9 g/dL (ref 3.5–5.0)
Alkaline Phosphatase: 61 U/L (ref 38–126)
Bilirubin, Direct: 0.1 mg/dL (ref 0.0–0.2)
Indirect Bilirubin: 0.7 mg/dL (ref 0.3–0.9)
Total Bilirubin: 0.8 mg/dL (ref 0.3–1.2)
Total Protein: 6.3 g/dL — ABNORMAL LOW (ref 6.5–8.1)

## 2021-07-07 LAB — PHOSPHORUS: Phosphorus: 2.5 mg/dL (ref 2.5–4.6)

## 2021-07-07 LAB — TSH: TSH: 4.845 u[IU]/mL — ABNORMAL HIGH (ref 0.350–4.500)

## 2021-07-07 LAB — FOLATE: Folate: 21.1 ng/mL (ref >5.9)

## 2021-07-07 LAB — RETICULOCYTES
Immature Retic Fract: 11.5 % (ref 2.3–15.9)
RBC.: 3.29 MIL/uL — ABNORMAL LOW (ref 3.87–5.11)
Retic Count, Absolute: 47.4 10*3/uL (ref 19.0–186.0)
Retic Ct Pct: 1.4 % (ref 0.4–3.1)

## 2021-07-07 LAB — T4, FREE: Free T4: 1.18 ng/dL — ABNORMAL HIGH (ref 0.61–1.12)

## 2021-07-07 LAB — MAGNESIUM: Magnesium: 2.1 mg/dL (ref 1.7–2.4)

## 2021-07-07 LAB — VITAMIN D 25 HYDROXY (VIT D DEFICIENCY, FRACTURES): Vit D, 25-Hydroxy: 30.67 ng/mL (ref 30–100)

## 2021-07-07 LAB — TYPE AND SCREEN
ABO/RH(D): B POS
Antibody Screen: NEGATIVE

## 2021-07-07 LAB — FERRITIN: Ferritin: 52 ng/mL (ref 11–307)

## 2021-07-07 LAB — VITAMIN B12: Vitamin B-12: 214 pg/mL (ref 180–914)

## 2021-07-07 SURGERY — FIXATION, FRACTURE, INTERTROCHANTERIC, WITH INTRAMEDULLARY ROD
Anesthesia: General | Site: Hip | Laterality: Left

## 2021-07-07 MED ORDER — METHOCARBAMOL 500 MG PO TABS: 500.0000 mg | ORAL_TABLET | Freq: Four times a day (QID) | ORAL | Status: DC | PRN

## 2021-07-07 MED ORDER — PROPOFOL 10 MG/ML IV BOLUS
INTRAVENOUS | Status: DC | PRN
  Administered 2021-07-07 (×2): 20 mg via INTRAVENOUS
  Administered 2021-07-07: 100 mg via INTRAVENOUS

## 2021-07-07 MED ORDER — ONDANSETRON HCL 4 MG/2ML IJ SOLN
4.0000 mg | Freq: Four times a day (QID) | INTRAMUSCULAR | Status: DC | PRN
  Filled 2021-07-07: qty 2

## 2021-07-07 MED ORDER — OXYCODONE HCL 5 MG PO TABS: 5.0000 mg | ORAL_TABLET | Freq: Once | ORAL | Status: DC | PRN

## 2021-07-07 MED ORDER — LIDOCAINE 2% (20 MG/ML) 5 ML SYRINGE
INTRAMUSCULAR | Status: DC | PRN
  Administered 2021-07-07: 60 mg via INTRAVENOUS

## 2021-07-07 MED ORDER — PHENYLEPHRINE 40 MCG/ML (10ML) SYRINGE FOR IV PUSH (FOR BLOOD PRESSURE SUPPORT)
PREFILLED_SYRINGE | INTRAVENOUS | Status: AC
  Filled 2021-07-07: qty 20

## 2021-07-07 MED ORDER — CEFAZOLIN SODIUM-DEXTROSE 2-4 GM/100ML-% IV SOLN
2.0000 g | Freq: Four times a day (QID) | INTRAVENOUS | Status: AC
  Administered 2021-07-07 (×2): 2 g via INTRAVENOUS
  Filled 2021-07-07 (×2): qty 100

## 2021-07-07 MED ORDER — TRAMADOL HCL 50 MG PO TABS
50.0000 mg | ORAL_TABLET | Freq: Two times a day (BID) | ORAL | Status: DC | PRN
  Administered 2021-07-07: 50 mg via ORAL
  Filled 2021-07-07: qty 1

## 2021-07-07 MED ORDER — TRANEXAMIC ACID-NACL 1000-0.7 MG/100ML-% IV SOLN
INTRAVENOUS | Status: DC | PRN
  Administered 2021-07-07: 1000 mg via INTRAVENOUS

## 2021-07-07 MED ORDER — SUGAMMADEX SODIUM 500 MG/5ML IV SOLN
INTRAVENOUS | Status: AC
  Filled 2021-07-07: qty 5

## 2021-07-07 MED ORDER — TRANEXAMIC ACID-NACL 1000-0.7 MG/100ML-% IV SOLN
1000.0000 mg | Freq: Once | INTRAVENOUS | Status: AC
  Administered 2021-07-07: 1000 mg via INTRAVENOUS
  Filled 2021-07-07: qty 100

## 2021-07-07 MED ORDER — HYDROCODONE-ACETAMINOPHEN 5-325 MG PO TABS
1.0000 | ORAL_TABLET | ORAL | Status: DC | PRN
  Administered 2021-07-07 – 2021-07-09 (×3): 1 via ORAL
  Filled 2021-07-07 (×3): qty 1

## 2021-07-07 MED ORDER — ROCURONIUM BROMIDE 10 MG/ML (PF) SYRINGE
PREFILLED_SYRINGE | INTRAVENOUS | Status: DC | PRN
  Administered 2021-07-07: 30 mg via INTRAVENOUS

## 2021-07-07 MED ORDER — 0.9 % SODIUM CHLORIDE (POUR BTL) OPTIME
TOPICAL | Status: DC | PRN
  Administered 2021-07-07: 1000 mL

## 2021-07-07 MED ORDER — HYDROCODONE-ACETAMINOPHEN 7.5-325 MG PO TABS
1.0000 | ORAL_TABLET | ORAL | Status: DC | PRN
  Administered 2021-07-09: 1 via ORAL
  Administered 2021-07-09: 2 via ORAL
  Filled 2021-07-07: qty 2
  Filled 2021-07-07: qty 1

## 2021-07-07 MED ORDER — PROPOFOL 1000 MG/100ML IV EMUL
INTRAVENOUS | Status: AC
  Filled 2021-07-07: qty 300

## 2021-07-07 MED ORDER — DEXAMETHASONE SODIUM PHOSPHATE 10 MG/ML IJ SOLN
10.0000 mg | Freq: Once | INTRAMUSCULAR | Status: AC
  Administered 2021-07-08: 10 mg via INTRAVENOUS
  Filled 2021-07-07: qty 1

## 2021-07-07 MED ORDER — DEXAMETHASONE SODIUM PHOSPHATE 10 MG/ML IJ SOLN
INTRAMUSCULAR | Status: AC
  Filled 2021-07-07: qty 1

## 2021-07-07 MED ORDER — METHOCARBAMOL 500 MG IVPB - SIMPLE MED
INTRAVENOUS | Status: AC
  Filled 2021-07-07: qty 50

## 2021-07-07 MED ORDER — METOCLOPRAMIDE HCL 5 MG PO TABS: 5.0000 mg | ORAL_TABLET | Freq: Three times a day (TID) | ORAL | Status: DC | PRN

## 2021-07-07 MED ORDER — POLYETHYLENE GLYCOL 3350 17 G PO PACK: 17.0000 g | PACK | Freq: Every day | ORAL | Status: DC | PRN

## 2021-07-07 MED ORDER — BISACODYL 10 MG RE SUPP: 10.0000 mg | Freq: Every day | RECTAL | Status: DC | PRN

## 2021-07-07 MED ORDER — TRANEXAMIC ACID-NACL 1000-0.7 MG/100ML-% IV SOLN
INTRAVENOUS | Status: AC
  Filled 2021-07-07: qty 100

## 2021-07-07 MED ORDER — PROPOFOL 500 MG/50ML IV EMUL
INTRAVENOUS | Status: DC | PRN
  Administered 2021-07-07: 150 ug/kg/min via INTRAVENOUS

## 2021-07-07 MED ORDER — DOCUSATE SODIUM 100 MG PO CAPS
100.0000 mg | ORAL_CAPSULE | Freq: Two times a day (BID) | ORAL | Status: DC
  Administered 2021-07-07 – 2021-07-09 (×4): 100 mg via ORAL
  Filled 2021-07-07 (×4): qty 1

## 2021-07-07 MED ORDER — PROPOFOL 10 MG/ML IV BOLUS
INTRAVENOUS | Status: AC
  Filled 2021-07-07: qty 20

## 2021-07-07 MED ORDER — FENTANYL CITRATE (PF) 250 MCG/5ML IJ SOLN
INTRAMUSCULAR | Status: DC | PRN
  Administered 2021-07-07 (×5): 50 ug via INTRAVENOUS

## 2021-07-07 MED ORDER — ACETAMINOPHEN 500 MG PO TABS
ORAL_TABLET | ORAL | Status: AC
  Filled 2021-07-07: qty 2

## 2021-07-07 MED ORDER — LACTATED RINGERS IV SOLN: INTRAVENOUS | Status: DC | PRN

## 2021-07-07 MED ORDER — CEFAZOLIN SODIUM-DEXTROSE 2-3 GM-%(50ML) IV SOLR
INTRAVENOUS | Status: DC | PRN
  Administered 2021-07-07: 2 g via INTRAVENOUS

## 2021-07-07 MED ORDER — OXYCODONE HCL 5 MG/5ML PO SOLN: 5.0000 mg | Freq: Once | ORAL | Status: DC | PRN

## 2021-07-07 MED ORDER — CEFAZOLIN SODIUM-DEXTROSE 2-4 GM/100ML-% IV SOLN
INTRAVENOUS | Status: AC
  Filled 2021-07-07: qty 100

## 2021-07-07 MED ORDER — PHENYLEPHRINE 40 MCG/ML (10ML) SYRINGE FOR IV PUSH (FOR BLOOD PRESSURE SUPPORT)
PREFILLED_SYRINGE | INTRAVENOUS | Status: DC | PRN
  Administered 2021-07-07: 80 ug via INTRAVENOUS

## 2021-07-07 MED ORDER — HYDROCODONE-ACETAMINOPHEN 5-325 MG PO TABS
1.0000 | ORAL_TABLET | Freq: Four times a day (QID) | ORAL | Status: DC | PRN
  Administered 2021-07-07: 2 via ORAL
  Filled 2021-07-07: qty 2

## 2021-07-07 MED ORDER — ROCURONIUM BROMIDE 10 MG/ML (PF) SYRINGE
PREFILLED_SYRINGE | INTRAVENOUS | Status: AC
  Filled 2021-07-07: qty 20

## 2021-07-07 MED ORDER — MORPHINE SULFATE (PF) 2 MG/ML IV SOLN
0.5000 mg | INTRAVENOUS | Status: DC | PRN
  Administered 2021-07-07 (×3): 0.5 mg via INTRAVENOUS
  Filled 2021-07-07 (×3): qty 1

## 2021-07-07 MED ORDER — BUPIVACAINE HCL (PF) 0.5 % IJ SOLN
INTRAMUSCULAR | Status: AC
  Filled 2021-07-07: qty 30

## 2021-07-07 MED ORDER — ONDANSETRON HCL 4 MG/2ML IJ SOLN: 4.0000 mg | Freq: Once | INTRAMUSCULAR | Status: DC | PRN

## 2021-07-07 MED ORDER — METHOCARBAMOL 500 MG IVPB - SIMPLE MED
500.0000 mg | Freq: Four times a day (QID) | INTRAVENOUS | Status: DC | PRN
  Administered 2021-07-07 – 2021-07-08 (×2): 500 mg via INTRAVENOUS
  Filled 2021-07-07: qty 50
  Filled 2021-07-07: qty 500

## 2021-07-07 MED ORDER — EPHEDRINE 5 MG/ML INJ
INTRAVENOUS | Status: AC
  Filled 2021-07-07: qty 5

## 2021-07-07 MED ORDER — METHOCARBAMOL 1000 MG/10ML IJ SOLN
500.0000 mg | Freq: Four times a day (QID) | INTRAVENOUS | Status: DC | PRN
  Filled 2021-07-07: qty 5

## 2021-07-07 MED ORDER — MORPHINE SULFATE (PF) 2 MG/ML IV SOLN
0.5000 mg | INTRAVENOUS | Status: DC | PRN
  Filled 2021-07-07: qty 1

## 2021-07-07 MED ORDER — FENTANYL CITRATE (PF) 250 MCG/5ML IJ SOLN
INTRAMUSCULAR | Status: AC
  Filled 2021-07-07: qty 5

## 2021-07-07 MED ORDER — METOCLOPRAMIDE HCL 5 MG/ML IJ SOLN: 5.0000 mg | Freq: Three times a day (TID) | INTRAMUSCULAR | Status: DC | PRN

## 2021-07-07 MED ORDER — DIPHENHYDRAMINE HCL 12.5 MG/5ML PO ELIX: 12.5000 mg | ORAL_SOLUTION | ORAL | Status: DC | PRN

## 2021-07-07 MED ORDER — ASPIRIN 81 MG PO CHEW
81.0000 mg | CHEWABLE_TABLET | Freq: Two times a day (BID) | ORAL | Status: DC
  Administered 2021-07-07 – 2021-07-08 (×2): 81 mg via ORAL
  Filled 2021-07-07 (×2): qty 1

## 2021-07-07 MED ORDER — SODIUM CHLORIDE 0.9 % IV SOLN: INTRAVENOUS | Status: DC

## 2021-07-07 MED ORDER — SUCCINYLCHOLINE CHLORIDE 200 MG/10ML IV SOSY
PREFILLED_SYRINGE | INTRAVENOUS | Status: DC | PRN
  Administered 2021-07-07: 120 mg via INTRAVENOUS

## 2021-07-07 MED ORDER — ACETAMINOPHEN 500 MG PO TABS
1000.0000 mg | ORAL_TABLET | Freq: Once | ORAL | Status: AC
  Administered 2021-07-07: 1000 mg via ORAL

## 2021-07-07 MED ORDER — PHENYLEPHRINE HCL (PRESSORS) 10 MG/ML IV SOLN
INTRAVENOUS | Status: AC
  Filled 2021-07-07: qty 1

## 2021-07-07 MED ORDER — PHENYLEPHRINE HCL-NACL 20-0.9 MG/250ML-% IV SOLN
INTRAVENOUS | Status: DC | PRN
  Administered 2021-07-07: 25 ug/min via INTRAVENOUS

## 2021-07-07 MED ORDER — ONDANSETRON HCL 4 MG PO TABS: 4.0000 mg | ORAL_TABLET | Freq: Four times a day (QID) | ORAL | Status: DC | PRN

## 2021-07-07 MED ORDER — FERROUS SULFATE 325 (65 FE) MG PO TABS
325.0000 mg | ORAL_TABLET | Freq: Three times a day (TID) | ORAL | Status: DC
  Administered 2021-07-07 – 2021-07-09 (×7): 325 mg via ORAL
  Filled 2021-07-07 (×7): qty 1

## 2021-07-07 MED ORDER — FENTANYL CITRATE PF 50 MCG/ML IJ SOSY
PREFILLED_SYRINGE | INTRAMUSCULAR | Status: AC
  Filled 2021-07-07: qty 1

## 2021-07-07 MED ORDER — ONDANSETRON HCL 4 MG/2ML IJ SOLN
INTRAMUSCULAR | Status: DC | PRN
  Administered 2021-07-07: 4 mg via INTRAVENOUS

## 2021-07-07 MED ORDER — FENTANYL CITRATE PF 50 MCG/ML IJ SOSY
PREFILLED_SYRINGE | INTRAMUSCULAR | Status: AC
  Administered 2021-07-07: 50 ug via INTRAVENOUS
  Filled 2021-07-07: qty 2

## 2021-07-07 MED ORDER — SUGAMMADEX SODIUM 200 MG/2ML IV SOLN
INTRAVENOUS | Status: DC | PRN
  Administered 2021-07-07: 200 mg via INTRAVENOUS

## 2021-07-07 MED ORDER — METHOCARBAMOL 500 MG PO TABS
500.0000 mg | ORAL_TABLET | Freq: Four times a day (QID) | ORAL | Status: DC | PRN
  Administered 2021-07-07: 500 mg via ORAL
  Filled 2021-07-07: qty 1

## 2021-07-07 MED ORDER — STERILE WATER FOR IRRIGATION IR SOLN
Status: DC | PRN
  Administered 2021-07-07: 1000 mL

## 2021-07-07 MED ORDER — PHENYLEPHRINE 40 MCG/ML (10ML) SYRINGE FOR IV PUSH (FOR BLOOD PRESSURE SUPPORT)
PREFILLED_SYRINGE | INTRAVENOUS | Status: AC
  Filled 2021-07-07: qty 10

## 2021-07-07 MED ORDER — LIDOCAINE HCL (PF) 2 % IJ SOLN
INTRAMUSCULAR | Status: AC
  Filled 2021-07-07: qty 10

## 2021-07-07 MED ORDER — TRAMADOL HCL 50 MG PO TABS: 50.0000 mg | ORAL_TABLET | Freq: Two times a day (BID) | ORAL | Status: DC | PRN

## 2021-07-07 MED ORDER — SUCCINYLCHOLINE CHLORIDE 200 MG/10ML IV SOSY
PREFILLED_SYRINGE | INTRAVENOUS | Status: AC
  Filled 2021-07-07: qty 30

## 2021-07-07 MED ORDER — MORPHINE SULFATE (PF) 2 MG/ML IV SOLN: 1.0000 mg | INTRAVENOUS | Status: DC | PRN

## 2021-07-07 MED ORDER — ROSUVASTATIN CALCIUM 5 MG PO TABS
5.0000 mg | ORAL_TABLET | Freq: Every day | ORAL | Status: DC
  Administered 2021-07-07 – 2021-07-08 (×3): 5 mg via ORAL
  Filled 2021-07-07 (×3): qty 1

## 2021-07-07 MED ORDER — LIDOCAINE HCL (PF) 2 % IJ SOLN
INTRAMUSCULAR | Status: AC
  Filled 2021-07-07: qty 5

## 2021-07-07 MED ORDER — MENTHOL 3 MG MT LOZG
1.0000 | LOZENGE | OROMUCOSAL | Status: DC | PRN
  Administered 2021-07-07 – 2021-07-09 (×2): 3 mg via ORAL
  Filled 2021-07-07: qty 9

## 2021-07-07 MED ORDER — FENTANYL CITRATE PF 50 MCG/ML IJ SOSY
25.0000 ug | PREFILLED_SYRINGE | INTRAMUSCULAR | Status: DC | PRN
  Administered 2021-07-07: 50 ug via INTRAVENOUS

## 2021-07-07 MED ORDER — POLYETHYLENE GLYCOL 3350 17 G PO PACK: 17.0000 g | PACK | Freq: Every day | ORAL | Status: DC

## 2021-07-07 MED ORDER — PHENOL 1.4 % MT LIQD: 1.0000 | OROMUCOSAL | Status: DC | PRN

## 2021-07-07 MED ORDER — ACETAMINOPHEN 325 MG PO TABS
325.0000 mg | ORAL_TABLET | Freq: Four times a day (QID) | ORAL | Status: DC | PRN
  Administered 2021-07-08 – 2021-07-09 (×2): 650 mg via ORAL
  Filled 2021-07-07: qty 2

## 2021-07-07 SURGICAL SUPPLY — 35 items
ADH SKN CLS APL DERMABOND .7 (GAUZE/BANDAGES/DRESSINGS) ×2
BAG COUNTER SPONGE SURGICOUNT (BAG) IMPLANT
BAG SPEC THK2 15X12 ZIP CLS (MISCELLANEOUS) ×1
BAG SPNG CNTER NS LX DISP (BAG)
BAG ZIPLOCK 12X15 (MISCELLANEOUS) ×2 IMPLANT
BIT DRILL CANN LG 4.3MM (BIT) IMPLANT
COVER PERINEAL POST (MISCELLANEOUS) ×2 IMPLANT
COVER SURGICAL LIGHT HANDLE (MISCELLANEOUS) ×2 IMPLANT
DERMABOND ADVANCED (GAUZE/BANDAGES/DRESSINGS) ×2
DERMABOND ADVANCED .7 DNX12 (GAUZE/BANDAGES/DRESSINGS) IMPLANT
DRAPE STERI IOBAN 125X83 (DRAPES) ×2 IMPLANT
DRILL BIT CANN LG 4.3MM (BIT) ×2
DRSG AQUACEL AG ADV 3.5X 4 (GAUZE/BANDAGES/DRESSINGS) ×2 IMPLANT
DRSG AQUACEL AG ADV 3.5X 6 (GAUZE/BANDAGES/DRESSINGS) ×3 IMPLANT
DURAPREP 26ML APPLICATOR (WOUND CARE) ×2 IMPLANT
ELECT REM PT RETURN 15FT ADLT (MISCELLANEOUS) ×2 IMPLANT
GLOVE SURG UNDER POLY LF SZ7.5 (GLOVE) ×4 IMPLANT
GOWN STRL REUS W/TWL LRG LVL3 (GOWN DISPOSABLE) ×2 IMPLANT
GUIDEPIN VERSANAIL DSP 3.2X444 (ORTHOPEDIC DISPOSABLE SUPPLIES) ×1 IMPLANT
HIP FRAC NAIL LAG SCR 10.5X100 (Orthopedic Implant) ×2 IMPLANT
KIT BASIN OR (CUSTOM PROCEDURE TRAY) ×2 IMPLANT
KIT TURNOVER KIT A (KITS) IMPLANT
MANIFOLD NEPTUNE II (INSTRUMENTS) ×2 IMPLANT
NAIL HIP FRACT 130D 11X180 (Screw) ×1 IMPLANT
PACK GENERAL/GYN (CUSTOM PROCEDURE TRAY) ×2 IMPLANT
PADDING CAST COTTON 6X4 STRL (CAST SUPPLIES) ×2 IMPLANT
PROTECTOR NERVE ULNAR (MISCELLANEOUS) ×2 IMPLANT
SCREW BONE CORTICAL 5.0X38 (Screw) ×1 IMPLANT
SCREW CANN THRD AFF 10.5X100 (Orthopedic Implant) IMPLANT
SUT MNCRL AB 4-0 PS2 18 (SUTURE) ×1 IMPLANT
SUT VIC AB 1 CT1 36 (SUTURE) ×2 IMPLANT
SUT VIC AB 2-0 CT1 27 (SUTURE) ×4
SUT VIC AB 2-0 CT1 27XBRD (SUTURE) ×2 IMPLANT
TOWEL OR 17X26 10 PK STRL BLUE (TOWEL DISPOSABLE) ×2 IMPLANT
TOWEL OR NON WOVEN STRL DISP B (DISPOSABLE) ×2 IMPLANT

## 2021-07-07 NOTE — H&P (Signed)
Rebecca King CNO:[id number] DOB: 1926-08-06 DOA: 07/06/2021     PCP: [name], MD   Outpatient Specialists:      Oncology   [place]  Patient arrived to ER on 07/06/21 at 2238 Referred by Attending Molpus, John, MD   Patient coming from: home Lives With family    Chief Complaint:   Chief Complaint  Patient presents with   Hip Injury    HPI: Rebecca King is a 87 y.o. female with medical history significant of CLL, osteopenia, GERD, HLD, CKD stage3, diastolic CHF, Aortic aneurysm, CAD     Presented with   mechanical fall on left hip while setting the table tripped on a rug her body twisted and she lended on left side Pain unable to get up Given fentanyl  50 mcg At baseline walks with a cane, can walk up to 200 feet with out SOB/CP limited only by bad knees Have not had her nitro for the past 68m    Has  been vaccinated against COVID  and boosted   Initial COVID TEST  NEGATIVE   Lab Results  Component Value Date   [place] NEGATIVE 07/06/2021   [place] Not Detected 11/17/2019     Regarding pertinent Chronic problems:   CLL since 2011 followed by Oncology   Chronic diastolic CHF last echo in 2018 showing preserved EF and G1DD   Hyperlipidemia -  on statins Crestor    CKD stage IIIa- baseline Cr 1.2 CrCl cannot be calculated (Unknown ideal weight.).  Lab Results  Component Value Date   CREATININE 0.95 07/06/2021   CREATININE 1.20 (H) 04/20/2021   CREATININE 1.04 (H) 10/19/2020      Chronic anemia - baseline hg Hemoglobin & Hematocrit  Recent Labs    10/19/20 1436 04/20/21 1401 07/06/21 2335  HGB 11.5* 11.6* 10.9*     While in ER: Films showing left intertochanteric hip fracture Orthopedics is aware     ED Triage Vitals  Enc Vitals Group     BP 07/06/21 2250 (!) 149/92     Pulse Rate 07/06/21 2250 77     Resp 07/06/21 2250 13     Temp 07/06/21 2250 97.6 F (36.4 C)     Temp Source 07/06/21 2250  Oral     SpO2 07/06/21 2250 99 %     Weight --      Height --      Head Circumference --      Peak Flow --      Pain Score 07/06/21 2251 10     Pain Loc --      Pain Edu? --      Excl. in [place]? --   TMAX(24)@     _________________________________________ Significant initial  Findings: Abnormal Labs Reviewed  BASIC METABOLIC PANEL - Abnormal; Notable for the following components:      Result Value   Glucose, Bld 115 (*)    BUN 25 (*)    GFR, Estimated 56 (*)    All other components within normal limits  CBC WITH DIFFERENTIAL/PLATELET - Abnormal; Notable for the following components:   WBC 18.2 (*)    RBC 3.45 (*)    Hemoglobin 10.9 (*)    HCT 32.6 (*)    All other components within normal limits   ____________________________________________ Ordered   Left hip  Acute intertrochanteric hip fracture   CXR neg     ECG: Ordered Personally reviewed by me showing: HR : 75 Rhythm:  NSR,    no evidence of ischemic changes QTC 449   The recent clinical data is shown below. Vitals:   07/06/21 2250 07/07/21 0000  BP: (!) 149/92 132/66  Pulse: 77 74  Resp: 13 15  Temp: 97.6 F (36.4 C)   TempSrc: Oral   SpO2: 99% 97%    WBC     Component Value Date/Time   WBC 18.2 (H) 07/06/2021 2335   LYMPHSABS PENDING 07/06/2021 2335   LYMPHSABS 7.8 (H) 04/26/2017 1344   MONOABS PENDING 07/06/2021 2335   MONOABS 0.8 04/26/2017 1344   EOSABS PENDING 07/06/2021 2335   EOSABS 0.1 04/26/2017 1344   BASOSABS PENDING 07/06/2021 2335   BASOSABS 0.0 04/26/2017 1344      UA  ordered     Results for orders placed or performed in visit on 11/17/19  Novel Coronavirus, NAA ([facility])     Status: None   Collection Time: 11/17/19 11:48 AM   Specimen: Nasopharyngeal(NP) swabs in vial transport medium   NASOPHARYNGE  TESTING  Result Value Ref Range Status   SARS-CoV-2, NAA Not Detected Not Detected Final        SARS-COV-2, NAA 2 DAY TAT     Status: None   Collection Time: 11/17/19  11:48 AM   NASOPHARYNGE  TESTING  Result Value Ref Range Status   SARS-CoV-2, NAA 2 DAY TAT Performed  Final    _______________________________________________________ ER Provider Called:   orthopedics  Dr. Alvan Dame They Recommend admit to medicine   Will see in AM   _______________________________________________ Hospitalist was called for admission for left intertochanteric hip fracture  The following Work up has been ordered so far:  Orders Placed This Encounter  Procedures   Resp Panel by RT-PCR (Flu A&B, Covid) Nasopharyngeal Swab   DG Hip Unilat With Pelvis 2-3 Views Left   DG Chest 1 View   DG Knee Complete 4 Views Left   Basic metabolic panel   CBC WITH DIFFERENTIAL   Protime-INR   Diet NPO time specified   Check CMS   Bed rest   Initiate [place] Fluid Protocol   Consult to orthopedic surgery   Consult to hospitalist  Left Hip Fracture--[facility] consulted.   ED EKG   Type and screen [facility]     Following Medications were ordered in ER: Medications  0.9 %  sodium chloride infusion ( Intravenous New Bag/Given 07/06/21 2344)  fentaNYL (SUBLIMAZE) injection 50 mcg (50 mcg Intravenous Given 07/07/21 0022)  ondansetron (ZOFRAN) injection 4 mg (4 mg Intravenous Given 07/06/21 2256)        Consult Orders  (From admission, onward)           Start     Ordered   07/07/21 0022  Consult to hospitalist  Left Hip Fracture--[facility] consulted.  Once       Comments: Left Hip Fracture--[facility] consulted.  Provider:  (Not yet assigned)  Question Answer Comment  Place call to: [facility]   Reason for Consult Admit      07/07/21 0021              OTHER Significant initial  Findings:  labs showing:    Recent Labs  Lab 07/06/21 2335  NA 136  K 4.3  CO2 25  GLUCOSE 115*  BUN 25*  CREATININE 0.95  CALCIUM 9.4    Cr  down from baseline see below Lab Results  Component Value Date   CREATININE 0.95 07/06/2021    CREATININE 1.20 (H) 04/20/2021  CREATININE 1.04 (H) 10/19/2020    Recent Labs  Lab 07/06/21 2335  AST 21  ALT 15  ALKPHOS 61  BILITOT 0.8  PROT 6.3*  ALBUMIN 3.9   Lab Results  Component Value Date   CALCIUM 9.4 07/06/2021         Plt: Lab Results  Component Value Date   PLT 260 07/06/2021    COVID-19 Labs  No results for input(s): DDIMER, FERRITIN, LDH, CRP in the last 72 hours.  Lab Results  Component Value Date   [place] Not Detected 11/17/2019        Recent Labs  Lab 07/06/21 2335  WBC 18.2*  NEUTROABS PENDING  HGB 10.9*  HCT 32.6*  MCV 94.5  PLT 260    HG/HCT   stable,       Component Value Date/Time   HGB 10.9 (L) 07/06/2021 2335   HGB 11.3 (L) 04/25/2018 1458   HGB 12.2 04/26/2017 1344   HCT 32.6 (L) 07/06/2021 2335   HCT 36.3 04/26/2017 1344   MCV 94.5 07/06/2021 2335   MCV 94.3 04/26/2017 1344       Cardiac Panel (last 3 results) Recent Labs    07/06/21 2335  CKTOTAL 86          Cultures: No results found for: SDES, [place], CULT, REPTSTATUS   Radiological Exams on Admission: DG Chest 1 View  Result Date: 07/06/2021 CLINICAL DATA:  Fall with hip fracture EXAM: CHEST  1 VIEW COMPARISON:  02/26/2020 FINDINGS: No focal opacity or pleural effusion. Stable cardiomediastinal silhouette with aortic atherosclerosis. No pneumothorax. IMPRESSION: No active disease. Electronically Signed   By: Donavan Foil M.D.   On: 07/06/2021 23:32   DG Knee Complete 4 Views Left  Result Date: 07/06/2021 CLINICAL DATA:  Recent fall with knee pain, initial encounter EXAM: LEFT KNEE - COMPLETE 4+ VIEW COMPARISON:  None. FINDINGS: Degenerative changes of the left knee joint are seen. No acute fracture or dislocation is seen. Patella is within normal limits. No joint effusion is noted. IMPRESSION: Tricompartmental degenerative changes without acute abnormality. Electronically Signed   By: Inez Catalina M.D.   On: 07/06/2021 23:41   DG Hip Unilat  With Pelvis 2-3 Views Left  Result Date: 07/06/2021 CLINICAL DATA:  Fall hip pain EXAM: DG HIP (WITH OR WITHOUT PELVIS) 2-3V LEFT COMPARISON:  None. FINDINGS: SI joints are non widened. Pubic symphysis and rami appear intact. Acute nondisplaced intertrochanteric fracture on the left. No femoral head dislocation IMPRESSION: Acute left intertrochanteric fracture Electronically Signed   By: Donavan Foil M.D.   On: 07/06/2021 23:31   _______________________________________________________________________________________________________ Latest  Blood pressure 132/66, pulse 74, temperature 97.6 F (36.4 C), temperature source Oral, resp. rate 15, SpO2 97 %.   Review of Systems:    Pertinent positives include:  fatigue, knee pain  Constitutional:  No weight loss, night sweats, Fevers, chills, weight loss  HEENT:  No headaches, Difficulty swallowing,Tooth/dental problems,Sore throat,  No sneezing, itching, ear ache, nasal congestion, post nasal drip,  Cardio-vascular:  No chest pain, Orthopnea, PND, anasarca, dizziness, palpitations.no Bilateral lower extremity swelling  GI:  No heartburn, indigestion, abdominal pain, nausea, vomiting, diarrhea, change in bowel habits, loss of appetite, melena, blood in stool, hematemesis Resp:  no shortness of breath at rest. No dyspnea on exertion, No excess mucus, no productive cough, No non-productive cough, No coughing up of blood.No change in color of mucus.No wheezing. Skin:  no rash or lesions. No jaundice GU:  no dysuria, change in color  of urine, no urgency or frequency. No straining to urinate.  No flank pain.  Musculoskeletal:  No joint pain or no joint swelling. No decreased range of motion. No back pain.  Psych:  No change in mood or affect. No depression or anxiety. No memory loss.  Neuro: no localizing neurological complaints, no tingling, no weakness, no double vision, no gait abnormality, no slurred speech, no confusion  All systems  reviewed and apart from [place] all are negative _______________________________________________________________________________________________ Past Medical History:   Past Medical History:  Diagnosis Date   Abnormal nuclear stress test 01/26/2009   POST EXERCISE PVC's NOTED SINCE 2003   CLL (chronic lymphocytic leukemia) ([place]) 10/2009   DR. KALE   Colon polyp    HYPERPLASTIC   Diverticulosis    DJD (degenerative joint disease)    GERD (gastroesophageal reflux disease)    Hearing loss    DR. SHOEMAKER   Hypercholesterolemia    Hyperlipidemia    Iron deficiency anemia 05/2010   Lesion of breast    LEFT, SMALL   Microscopic hematuria    DR. PETERSON   Neuropathy    PERIPHERAL   Osteopenia    Renal cyst 10/2009   RENAL CYST ON CT 11/2009   Renal disease    STAGE 3   Tendon cysts    EXTENSOR 2MM RIGHT FIRST FINGER   Vitamin D deficiency 10/2011      Past Surgical History:  Procedure Laterality Date   LEFT HEART CATHETERIZATION WITH CORONARY ANGIOGRAM N/A 12/25/2011   Procedure: LEFT HEART CATHETERIZATION WITH CORONARY ANGIOGRAM;  Surgeon: Sinclair Grooms, MD;  [facility] LAB;  Service: Cardiovascular;  Laterality: N/A;    Social History:  Ambulatory  cane,     reports that she quit smoking about 41 years ago. She has never used smokeless tobacco. She reports current alcohol use. She reports that she does not use drugs.     Family History:   Family History  Family history unknown: Yes   ______________________________________________________________________________________________ Allergies: Allergies  Allergen Reactions   Amoxicillin Other (See Comments)    HEADACHES Other reaction(s): Headache   Macrolides And Ketolides     Other reaction(s): Unknown   Metoprolol     Other reaction(s): hypotension   Ciprofloxacin Nausea Only and Rash    And tingling lips and itching   Macrodantin [Nitrofurantoin] Rash   Sulfa Antibiotics Nausea Only and Rash     And tingling lips and itching Other reaction(s): Unknown     Prior to Admission medications   Medication Sig Start Date End Date Taking? Authorizing Provider  acetaminophen (TYLENOL) 650 MG CR tablet Take 650 mg by mouth 2 (two) times daily.   Yes [provider]  aspirin 81 MG tablet Take 81 mg by mouth daily.   Yes [provider]  Coenzyme Q10 (CO Q 10 PO) Take 1 tablet by mouth daily.   Yes [provider]  Multiple Vitamin (MULITIVITAMIN WITH MINERALS) TABS Take 1 tablet by mouth daily.   Yes [provider]  Probiotic Product (ALIGN PO) Take 1 capsule by mouth as needed.    Yes [provider]  rosuvastatin (CRESTOR) 5 MG tablet Take 5 mg by mouth at bedtime.   Yes [provider]  traMADol (ULTRAM) 50 MG tablet Take 50 mg by mouth 2 (two) times daily as needed for moderate pain.    Yes [provider]  NITROSTAT 0.4 MG SL tablet DISSOLVE ONE TABLET UNDERTONGUE AS DIRECTED AS  NEEDED FOR CHEST PAIN Patient not taking: Reported on 07/06/2021 08/31/13   Belva Crome, MD  polyethylene glycol [facility] / Floria Raveling) packet Take 17 g by mouth every other day.     [provider]    ___________________________________________________________________________________________________ Physical Exam: Vitals with BMI 07/07/2021 07/06/2021 04/20/2021  Height - - -  Weight - - 156 lbs  BMI - - -  Systolic 333 545 625  Diastolic 66 92 85  Pulse 74 77 81     1. General:  in No  Acute distress   Chronically ill  -appearing 2. Psychological: Alert and  Oriented 3. Head/ENT:    Dry Mucous Membranes                          Head Non traumatic, neck supple                           Poor Dentition 4. SKIN: decreased Skin turgor,  Skin clean Dry and intact no rash 5. Heart: Regular rate and rhythm no Murmur, no Rub or gallop 6. Lungs:  no wheezes or crackles   7. Abdomen: Soft,  non-tender, Non distended  bowel sounds  present 8. Lower extremities: no clubbing, cyanosis, no  edema 9. Neurologically Grossly intact, moving all 4 extremities equally   10. MSK: Normal range of motion  Limitted on left due to pain   Chart has been reviewed  ______________________________________________________________________________________________  Assessment/Plan  87 y.o. female with medical history significant of CLL, osteopenia, GERD, HLD, CKD stage3, diastolic CHF, Aortic aneurysm  Admitted for   left intertochanteric hip fracture  Present on Admission:  Closed left hip fracture ([place]) -  - management as per orthopedics,  plan to operate on 07/08/2021    Keep nothing by mouth post midnight tomorrow night. Patient   not on anticoagulation   antiplatelet agents   on hold Ordered type and screen, Place Foley, order a vitamin D level  Patient at baseline   able to walk a flight of stairs or 100 feet   But limited to do so extensively due joint pain   Patient denies any chest pain or shortness of breath currently and/or with exertion,  ECG showing no evidence of acute ischemia  known history of coronary artery disease,   CKD  Given advanced age patient is at least moderate  risk  which has been discussed with family but at this point no furthther cardiac workup is indicated.    will order echo    emailed Cardiology consult for further pre-op clearance given extensive cardiac disease    Stage III chronic kidney disease ([place]) - now improved Cr likely due to loss of muscle mass   GERD (gastroesophageal reflux disease) - chronic stable   CLL (chronic lymphocytic leukemia) ([place]) - chronic , followed by oncology WBC stable Smudge cells noted again   CAD in native artery - in the past has been seen by Dr. Tamala Julian last cardiac cath 2013  1. 50-70% ostial diagonal. The diagonal is large in distribution and diameter. 2. Widely patent left main, LAD, circumflex, and RCA. 3. Normal LV function States she has not used nitro  for over 28m   Hyperlipidemia - continue statin   Iron deficiency anemia - check anemia panel in AM   Chronic diastolic CHF (congestive heart failure) (HCC) - curretnly sable euvolemic avoid fluid ovrload   Ascending aortic aneurysm - stable  per CTA feb 2022 . Stable mild aneurysmal disease of the ascending thoracic aorta measuring approximately 4.1 cm in greatest diameter by CTA   Other plan as per orders.  DVT prophylaxis:  SCD     Code Status:    Code Status: DNR   DNR/DNI e as per patient   I had personally discussed CODE STATUS with patient     Family Communication:   Family not at  Bedside  plan of care was discussed   with Husband   Disposition Plan:     likely will need placement for rehabilitation                           Following barriers for discharge:                                                       Pain controlled with PO medications                                                           Will need to be able to tolerate PO                                                      Will need consultants to evaluate patient prior to discharge                       Would benefit from PT/OT eval prior to DC  Ordered                   Swallow eval - SLP ordered                                      Transition of care consulted                                 Consults called: Orthopedics Dr. Alvan Dame  Admission status:  ED Disposition     ED Disposition  [place]: [facility] [[id number]]  Level of Care: Med-Surg [16]  May admit patient to [facility] or [facility] if equivalent level of care is available:: No  Covid Evaluation: Confirmed COVID Negative  Diagnosis: Closed left hip fracture [facility]) [[id number]]  Admitting Physician: Toy Baker [[id number]]  Attending Physician: Toy Baker [[id number]]  Estimated length of stay: past midnight tomorrow  Certification:: I certify this patient will need  inpatient services for at least 2 midnights            inpatient     I Expect 2 midnight stay secondary to severity of patient's current illness need for inpatient interventions justified by the following:    Severe lab/radiological/exam abnormalities including:  Left hip fracture and extensive comorbidities including:     CKD   malignancy,    That are currently affecting medical management.   I expect  patient to be hospitalized for 2 midnights requiring inpatient medical care.  Patient is at high risk for adverse outcome (such as loss of life or disability) if not treated.  Indication for inpatient stay as follows:    severe pain requiring acute inpatient management,    Need for operative/procedural  intervention    Need for  IV fluids,  , IV pain medications, IV anticoagulation    Level of care     medical floor        Lab Results  Component Value Date   [place] [employer] 07/06/2021     Precautions: admitted as  Covid Negative   PPE: Used by the provider:   N95  eye Goggles,  Gloves     Lovene Maret 07/07/2021, 2:00 AM    [facility]     after 2 AM please page floor coverage PA If 7AM-7PM, please contact the day team taking care of the patient using [email or web address]   Patient was evaluated in the context of the global COVID-19 pandemic, which necessitated consideration that the patient might be at risk for infection with the SARS-CoV-2 virus that causes COVID-19. Institutional protocols and algorithms that pertain to the evaluation of patients at risk for COVID-19 are in a state of rapid change based on information released by regulatory bodies including the CDC and federal and state organizations. These policies and algorithms were followed during the patient's care.

## 2021-07-07 NOTE — Progress Notes (Signed)
Echocardiogram 2D Echocardiogram been performed.  Rebecca King M 07/08/2021, 10:11 AM

## 2021-07-07 NOTE — Brief Op Note (Signed)
07/06/2021 - 07/07/2021  10:54 AM  PATIENT:  [name]  87 y.o. female  PRE-OPERATIVE DIAGNOSIS:  left intertrochanteric hip fracture  POST-OPERATIVE DIAGNOSIS:  left intertrochanteric hip fracture  PROCEDURE:  Procedure(s): INTRAMEDULLARY (IM) NAIL INTERTROCHANTRIC (Left)  SURGEON:  Surgeon(s) and Role:    Paralee Cancel, MD - Primary  PHYSICIAN ASSISTANT: Surgical team  ANESTHESIA:   general  EBL:  150 cc  BLOOD ADMINISTERED:none  DRAINS: none   LOCAL MEDICATIONS USED:  NONE  SPECIMEN:  No Specimen  DISPOSITION OF SPECIMEN:  N/A  COUNTS:  YES  TOURNIQUET:  * No tourniquets in log *  DICTATION: .Other Dictation: Dictation Number [id number]  PLAN OF CARE: Admit to inpatient   PATIENT DISPOSITION:  PACU - hemodynamically stable.   Delay start of Pharmacological VTE agent (>24hrs) due to surgical blood loss or risk of bleeding: no

## 2021-07-07 NOTE — Consult Note (Signed)
Reason for Consult: left hip fracture Referring Physician: Si Raider, MD (Hospitalist)  Rebecca King is an 87 y.o. female.  HPI: Rebecca King is a 87 y.o. female with medical history significant of CLL, osteopenia, GERD, HLD, CKD stage3, diastolic CHF, Aortic aneurysm, CAD       Presented with   mechanical fall on left hip while setting the table tripped on a rug her body twisted and she lended on left side Pain unable to get up Given fentanyl  50 mcg At baseline walks with a cane, can walk up to 200 feet with out SOB/CP limited only by bad knees Have not had her nitro for the past 59m  Past Medical History:  Diagnosis Date   Abnormal nuclear stress test 01/26/2009   POST EXERCISE PVC's NOTED SINCE 2003   CLL (chronic lymphocytic leukemia) ([place]) 10/2009   DR. KALE   Colon polyp    HYPERPLASTIC   Diverticulosis    DJD (degenerative joint disease)    GERD (gastroesophageal reflux disease)    Hearing loss    DR. SHOEMAKER   Hypercholesterolemia    Hyperlipidemia    Iron deficiency anemia 05/2010   Lesion of breast    LEFT, SMALL   Microscopic hematuria    DR. PETERSON   Neuropathy    PERIPHERAL   Osteopenia    Renal cyst 10/2009   RENAL CYST ON CT 11/2009   Renal disease    STAGE 3   Tendon cysts    EXTENSOR 2MM RIGHT FIRST FINGER   Vitamin D deficiency 10/2011    Past Surgical History:  Procedure Laterality Date   LEFT HEART CATHETERIZATION WITH CORONARY ANGIOGRAM N/A 12/25/2011   Procedure: LEFT HEART CATHETERIZATION WITH CORONARY ANGIOGRAM;  Surgeon: Sinclair Grooms, MD;  Location: [facility] CATH LAB;  Service: Cardiovascular;  Laterality: N/A;    Family History  Family history unknown: Yes    Social History:  reports that she quit smoking about 41 years ago. She has never used smokeless tobacco. She reports current alcohol use. She reports that she does not use drugs.  Allergies:  Allergies  Allergen Reactions   Amoxicillin Other (See Comments)     HEADACHES Other reaction(s): Headache   Macrolides And Ketolides     Other reaction(s): Unknown   Metoprolol     Other reaction(s): hypotension   Ciprofloxacin Nausea Only and Rash    And tingling lips and itching   Macrodantin [Nitrofurantoin] Rash   Sulfa Antibiotics Nausea Only and Rash    And tingling lips and itching Other reaction(s): Unknown    Medications: I have reviewed the patient's current medications. Scheduled:  acetaminophen       [MAR Hold] polyethylene glycol  17 g Oral Daily   [MAR Hold] rosuvastatin  5 mg Oral QHS    Results for orders placed or performed during the hospital encounter of 07/06/21 (from the past 24 hour(s))  Basic metabolic panel     Status: Abnormal   Collection Time: 07/06/21 11:35 PM  Result Value Ref Range   Sodium 136 135 - 145 mmol/L   Potassium 4.3 3.5 - 5.1 mmol/L   Chloride 105 98 - 111 mmol/L   CO2 25 22 - 32 mmol/L   Glucose, Bld 115 (H) 70 - 99 mg/dL   BUN 25 (H) 8 - 23 mg/dL   Creatinine, Ser 0.95 0.44 - 1.00 mg/dL   Calcium 9.4 8.9 - 10.3 mg/dL   GFR, Estimated 56 (L) >60 mL/min  Anion gap 6 5 - 15  CBC WITH DIFFERENTIAL     Status: Abnormal   Collection Time: 07/06/21 11:35 PM  Result Value Ref Range   WBC 18.2 (H) 4.0 - 10.5 K/uL   RBC 3.45 (L) 3.87 - 5.11 MIL/uL   Hemoglobin 10.9 (L) 12.0 - 15.0 g/dL   HCT 32.6 (L) 36.0 - 46.0 %   MCV 94.5 80.0 - 100.0 fL   MCH 31.6 26.0 - 34.0 pg   MCHC 33.4 30.0 - 36.0 g/dL   RDW 14.9 11.5 - 15.5 %   Platelets 260 150 - 400 K/uL   nRBC 0.0 0.0 - 0.2 %   Neutrophils Relative % 62 %   Neutro Abs 11.3 (H) 1.7 - 7.7 K/uL   Lymphocytes Relative 25 %   Lymphs Abs 4.6 (H) 0.7 - 4.0 K/uL   Monocytes Relative 11 %   Monocytes Absolute 2.0 (H) 0.1 - 1.0 K/uL   Eosinophils Relative 1 %   Eosinophils Absolute 0.2 0.0 - 0.5 K/uL   Basophils Relative 1 %   Basophils Absolute 0.2 (H) 0.0 - 0.1 K/uL   Abs Immature Granulocytes 0.00 0.00 - 0.07 K/uL   Smudge Cells PRESENT   Protime-INR      Status: None   Collection Time: 07/06/21 11:35 PM  Result Value Ref Range   Prothrombin Time 12.6 11.4 - 15.2 seconds   INR 0.9 0.8 - 1.2  Type and screen [place]     Status: None   Collection Time: 07/06/21 11:35 PM  Result Value Ref Range   ABO/RH(D) B POS    Antibody Screen NEG    Sample Expiration      07/09/2021,2359 Performed at [facility], [place] [address], [place], [place] [address]   Resp Panel by RT-PCR (Flu A&B, Covid) Nasopharyngeal Swab     Status: None   Collection Time: 07/06/21 11:35 PM   Specimen: Nasopharyngeal Swab; Nasopharyngeal(NP) swabs in vial transport medium  Result Value Ref Range   SARS Coronavirus 2 by RT PCR NEGATIVE NEGATIVE   Influenza A by PCR NEGATIVE NEGATIVE   Influenza B by PCR NEGATIVE NEGATIVE  CK     Status: None   Collection Time: 07/06/21 11:35 PM  Result Value Ref Range   Total CK 86 38 - 234 U/L  Magnesium     Status: None   Collection Time: 07/06/21 11:35 PM  Result Value Ref Range   Magnesium 2.1 1.7 - 2.4 mg/dL  Hepatic function panel     Status: Abnormal   Collection Time: 07/06/21 11:35 PM  Result Value Ref Range   Total Protein 6.3 (L) 6.5 - 8.1 g/dL   Albumin 3.9 3.5 - 5.0 g/dL   AST 21 15 - 41 U/L   ALT 15 0 - 44 U/L   Alkaline Phosphatase 61 38 - 126 U/L   Total Bilirubin 0.8 0.3 - 1.2 mg/dL   Bilirubin, Direct 0.1 0.0 - 0.2 mg/dL   Indirect Bilirubin 0.7 0.3 - 0.9 mg/dL  Phosphorus     Status: None   Collection Time: 07/06/21 11:35 PM  Result Value Ref Range   Phosphorus 2.5 2.5 - 4.6 mg/dL  CBC with Differential/Platelet     Status: Abnormal   Collection Time: 07/07/21  4:35 AM  Result Value Ref Range   WBC 17.3 (H) 4.0 - 10.5 K/uL   RBC 3.31 (L) 3.87 - 5.11 MIL/uL   Hemoglobin 10.3 (L) 12.0 - 15.0 g/dL  HCT 31.2 (L) 36.0 - 46.0 %   MCV 94.3 80.0 - 100.0 fL   MCH 31.1 26.0 - 34.0 pg   MCHC 33.0 30.0 - 36.0 g/dL   RDW 14.8 11.5 - 15.5 %   Platelets 247 150 - 400  K/uL   nRBC 0.0 0.0 - 0.2 %   Neutrophils Relative % 68 %   Neutro Abs 11.8 (H) 1.7 - 7.7 K/uL   Lymphocytes Relative 27 %   Lymphs Abs 4.7 (H) 0.7 - 4.0 K/uL   Monocytes Relative 5 %   Monocytes Absolute 0.9 0.1 - 1.0 K/uL   Eosinophils Relative 0 %   Eosinophils Absolute 0.0 0.0 - 0.5 K/uL   Basophils Relative 0 %   Basophils Absolute 0.0 0.0 - 0.1 K/uL   Abs Immature Granulocytes 0.00 0.00 - 0.07 K/uL   Smudge Cells PRESENT   TSH     Status: Abnormal   Collection Time: 07/07/21  4:35 AM  Result Value Ref Range   TSH 4.845 (H) 0.350 - 4.500 uIU/mL  Vitamin B12     Status: None   Collection Time: 07/07/21  4:35 AM  Result Value Ref Range   Vitamin B-12 214 180 - 914 pg/mL  Folate     Status: None   Collection Time: 07/07/21  4:35 AM  Result Value Ref Range   Folate 21.1 >5.9 ng/mL  Iron and TIBC     Status: None   Collection Time: 07/07/21  4:35 AM  Result Value Ref Range   Iron 37 28 - 170 ug/dL   TIBC 283 250 - 450 ug/dL   Saturation Ratios 13 10.4 - 31.8 %   UIBC 246 ug/dL  Ferritin     Status: None   Collection Time: 07/07/21  4:35 AM  Result Value Ref Range   Ferritin 52 11 - 307 ng/mL  Reticulocytes     Status: Abnormal   Collection Time: 07/07/21  4:35 AM  Result Value Ref Range   Retic Ct Pct 1.4 0.4 - 3.1 %   RBC. 3.29 (L) 3.87 - 5.11 MIL/uL   Retic Count, Absolute 47.4 19.0 - 186.0 K/uL   Immature Retic Fract 11.5 2.3 - 56.4 %  Basic metabolic panel     Status: Abnormal   Collection Time: 07/07/21  4:35 AM  Result Value Ref Range   Sodium 137 135 - 145 mmol/L   Potassium 4.4 3.5 - 5.1 mmol/L   Chloride 109 98 - 111 mmol/L   CO2 22 22 - 32 mmol/L   Glucose, Bld 113 (H) 70 - 99 mg/dL   BUN 21 8 - 23 mg/dL   Creatinine, Ser 0.87 0.44 - 1.00 mg/dL   Calcium 9.0 8.9 - 10.3 mg/dL   GFR, Estimated >60 >60 mL/min   Anion gap 6 5 - 15  VITAMIN D 25 Hydroxy (Vit-D Deficiency, Fractures)     Status: None   Collection Time: 07/07/21  4:35 AM  Result Value Ref  Range   Vit D, 25-Hydroxy 30.67 30 - 100 ng/mL    X-ray: CLINICAL DATA:  Fall hip pain   EXAM: DG HIP (WITH OR WITHOUT PELVIS) 2-3V LEFT   COMPARISON:  None.   FINDINGS: SI joints are non widened. Pubic symphysis and rami appear intact. Acute nondisplaced intertrochanteric fracture on the left. No femoral head dislocation   IMPRESSION: Acute left intertrochanteric fracture     Electronically Signed   By: Donavan Foil M.D.  ROS: Constitutional:  No weight  loss, night sweats, Fevers, chills, weight loss  HEENT:  No headaches, Difficulty swallowing,Tooth/dental problems,Sore throat,  No sneezing, itching, ear ache, nasal congestion, post nasal drip,  Cardio-vascular:  No chest pain, Orthopnea, PND, anasarca, dizziness, palpitations.no Bilateral lower extremity swelling  GI:  No heartburn, indigestion, abdominal pain, nausea, vomiting, diarrhea, change in bowel habits, loss of appetite, melena, blood in stool, hematemesis Resp:  no shortness of breath at rest. No dyspnea on exertion, No excess mucus, no productive cough, No non-productive cough, No coughing up of blood.No change in color of mucus.No wheezing. Skin:  no rash or lesions. No jaundice GU:  no dysuria, change in color of urine, no urgency or frequency. No straining to urinate.  No flank pain.  Musculoskeletal:  No joint pain or no joint swelling. No decreased range of motion. No back pain.  Psych:  No change in mood or affect. No depression or anxiety. No memory loss.  Neuro: no localizing neurological complaints, no tingling, no weakness, no double vision, no gait abnormality, no slurred speech, no confusion   All systems reviewed and apart from HOPI all are negative  Blood pressure 120/65, pulse 67, temperature 98.8 F (37.1 C), resp. rate 18, SpO2 100 %.  Physical Exam: 1. General:  in No  Acute distress   Chronically ill  -appearing 2. Psychological: Alert and  Oriented 3. Head/ENT:    Dry Mucous  Membranes                          Head Non traumatic, neck supple                           Poor Dentition 4. SKIN: decreased Skin turgor,  Skin clean Dry and intact no rash 5. Heart: Regular rate and rhythm no Murmur, no Rub or gallop 6. Lungs:  no wheezes or crackles   7. Abdomen: Soft,  non-tender, Non distended  bowel sounds present 8. Lower extremities: no clubbing, cyanosis, no  edema 9. Neurologically Grossly intact, moving all 4 extremities equally   10. MSK: Limitted on left due to pain, left LE short and externally rotated  Assessment/Plan: Left intertrochanteric hip fracture  Plan: Reviewed fracture pattern with patient and her husband.  I discussed the need for surgical stabilization of the fracture Risks of infection, DVT, malunion and non union reviewed Consent signed for benefit of hip fracture management NPO  Mauri Pole 07/07/2021, 10:46 AM

## 2021-07-07 NOTE — Transfer of Care (Signed)
Immediate Anesthesia Transfer of Care Note  Patient: Rebecca King  Procedure(s) Performed: INTRAMEDULLARY (IM) NAIL INTERTROCHANTRIC (Left: Hip)  Patient Location: PACU  Anesthesia Type:General  Level of Consciousness: awake  Airway & Oxygen Therapy: Patient Spontanous Breathing and Patient connected to face mask oxygen  Post-op Assessment: Report given to RN and Post -op Vital signs reviewed and stable  Post vital signs: Reviewed and stable  Last Vitals:  Vitals Value Taken Time  BP 123/69 07/07/21 1241  Temp    Pulse 85 07/07/21 1244  Resp 20 07/07/21 1244  SpO2 100 % 07/07/21 1244  Vitals shown include unvalidated device data.  Last Pain:  Vitals:   07/07/21 0834  TempSrc:   PainSc: 5          Complications: No notable events documented.

## 2021-07-07 NOTE — Discharge Instructions (Signed)

## 2021-07-07 NOTE — Progress Notes (Addendum)
Not for billing  Admitted earlier this morning after mechanical fall at home with left intertrochanteric hip fracture. On exam neurovascularly intact. Has hx of cad with 2013 cath showing 70-80% occlusion of ostial diagonal. She is ambulatory at baseline and participates in light housework without anginal symptoms. She has a TAA that is small 4.1 cm on recent CTA. She has CLL that is being monitored, no active treatment. She has ckd2. Have discussed with orthopedics, think given lack of symptomatic CAD no further cardiac w/u needed, will communicate w/ cardiology who were consulted overnight. Ortho to see today, will maintain patient NPO, there is a possibility they will be able to operate today. Pain control w/ morphine, will also start bowel regimen. Plan for PT/OT after surgery.  Labs show mild normocytic anemia with borderline low b12, will check homocysteine and mma. Labs also show mildly elevated tsh, will check t3/t4.

## 2021-07-07 NOTE — Anesthesia Procedure Notes (Signed)
Procedure Name: Intubation Date/Time: 07/07/2021 11:25 AM Performed by: Cynda Familia, CRNA Pre-anesthesia Checklist: Patient identified, Emergency Drugs available, Suction available and Patient being monitored Patient Re-evaluated:Patient Re-evaluated prior to induction Oxygen Delivery Method: Circle System Utilized Preoxygenation: Pre-oxygenation with 100% oxygen Induction Type: IV induction, Rapid sequence and Cricoid Pressure applied Ventilation: Mask ventilation without difficulty Laryngoscope Size: Miller and 2 Grade View: Grade III Tube type: Oral Number of attempts: 1 Airway Equipment and Method: Stylet and Oral airway Placement Confirmation: ETT inserted through vocal cords under direct vision, positive ETCO2 and breath sounds checked- equal and bilateral Secured at: 22 cm Tube secured with: Tape Dental Injury: Teeth and Oropharynx as per pre-operative assessment  Difficulty Due To: Difficult Airway- due to dentition, Difficult Airway- due to limited oral opening, Difficult Airway- due to immobile epiglottis and Difficult Airway- due to reduced neck mobility Future Recommendations: Recommend- induction with short-acting agent, and alternative techniques readily available Comments: DIFFICULT INTUBATION- CONSIDER GLIDESCOPE- Brock IV induction- intubation AM CRNA atraumatic- teeth and mouth as preop bilat BS Fransisco Beau

## 2021-07-07 NOTE — ED Notes (Signed)
Pt belongings sent home with husband

## 2021-07-07 NOTE — Plan of Care (Signed)
New admission s/p Hip fracture with ORIF.  Pt aox4, drowsy but easily aroused.  Left hip incision dressing c/d/I. Neuro/vascular checks intact and checks per protocol.  Diet resumed, IVF per orders.  Pain management per orders.  Strict bedrest, Purewick in place. PT/OT consulted.   Problem: Clinical Measurements: Goal: Ability to maintain clinical measurements within normal limits will improve Outcome: Progressing   Problem: Clinical Measurements: Goal: Will remain free from infection Outcome: Progressing   Problem: Activity: Goal: Risk for activity intolerance will decrease Outcome: Progressing   Problem: Nutrition: Goal: Adequate nutrition will be maintained Outcome: Progressing   Problem: Coping: Goal: Level of anxiety will decrease Outcome: Progressing   Problem: Pain Managment: Goal: General experience of comfort will improve Outcome: Progressing   Problem: Safety: Goal: Ability to remain free from injury will improve Outcome: Progressing

## 2021-07-07 NOTE — Anesthesia Postprocedure Evaluation (Signed)
Anesthesia Post Note  Patient: Rebecca King  Procedure(s) Performed: INTRAMEDULLARY (IM) NAIL INTERTROCHANTRIC (Left: Hip)     Patient location during evaluation: PACU Anesthesia Type: General Level of consciousness: awake and alert Pain management: pain level controlled Vital Signs Assessment: post-procedure vital signs reviewed and stable Respiratory status: spontaneous breathing, nonlabored ventilation, respiratory function stable and patient connected to nasal cannula oxygen Cardiovascular status: blood pressure returned to baseline and stable Postop Assessment: no apparent nausea or vomiting Anesthetic complications: no   No notable events documented.  Last Vitals:  Vitals:   07/07/21 1828 07/07/21 2214  BP: (!) 121/59 (!) 109/54  Pulse: 69 84  Resp: 20 18  Temp: 36.7 C 36.6 C  SpO2: 98% 100%    Last Pain:  Vitals:   07/07/21 2214  TempSrc: Oral  PainSc:                  Audry Pili

## 2021-07-07 NOTE — Op Note (Signed)
NAME: Rebecca King, MALESKY MEDICAL RECORD NO: [id number] ACCOUNT NO: [id number] DATE OF BIRTH: July 28, 1926 FACILITY: [facility] LOCATION: [id number] PHYSICIAN: Pietro Cassis. Alvan Dame, MD  Operative Report   DATE OF PROCEDURE: 07/07/2021  PREOPERATIVE DIAGNOSIS:  Displaced left intertrochanteric femur fracture.  POSTOPERATIVE DIAGNOSIS:  Displaced left intertrochanteric femur fracture.  PROCEDURE:  Open reduction and internal fixation of left intertrochanteric femur fracture utilizing an intramedullary nail.  COMPONENTS USED:  Zimmer Affixus intramedullary nail 11 x 180 mm with a 130-degree lag screw measuring 100 mm with distal interlock.  SURGEON:  Pietro Cassis. Alvan Dame, MD  ASSISTANT:  Surgical team.  ANESTHESIA:  General.  ESTIMATED BLOOD LOSS:  About 150 mL.  DRAINS:  None.  COMPLICATIONS:  None.  INDICATIONS OF PROCEDURE:  The patient is a 87 year old female who unfortunately had a ground level fall at home at [place].  She had immediate onset of pain and inability to bear weight.  She was brought to the emergency room where radiographs  revealed a left intertrochanteric femur fracture.  She was consulted by Orthopedics.  We reviewed the indications.  I reviewed risks of infection, DVT, malunion, nonunion.  We discussed the potential need for future surgery.  We reviewed the  postoperative course and expectations.  Consent was obtained for management of the fracture.  DESCRIPTION OF PROCEDURE:  The patient was brought to the operative theater.  Once adequate anesthesia, preoperative antibiotics, Ancef, as well as tranexamic acid and Decadron were administered, she was positioned supine on the fracture Hana table set  up.  All bony prominences were carefully padded and positioned.  A perineal post was applied.  Her right unaffected extremity was flexed and abducted out of the way with bony prominences padded, particularly over the lateral aspect and at the peroneal  nerve.  Her left foot was placed in  the traction boot.  Fluoroscopy was brought into the field, and with traction and internal rotation, I was able to reduce the fracture into a near anatomic position.  The left hip was then prepped and draped in a  routine fashion with a shower curtain technique.  Fluoroscopy was brought back to the field.  Timeout was performed identifying the patient, planned procedure, and extremity.  Landmarks were identified proximal to the trochanter.  Incision was then made  proximal to this.  Soft tissue dissection was carried through the gluteal fascia.  A guidewire was then inserted into the tip of the trochanter across the fracture site into the proximal femur, confirmed radiographically.  The proximal femur was then  drilled open.  The nail was then passed by hand to its appropriate depth.  Using the insertion jig and the guidewire insertion sleeve, a guidewire was inserted slightly inferior in the center of the head and slightly posterior in the center of the head  to provide stability.  I measured and selected a 100 mm lag screw.  I drilled and then placed a screw.  Once the screw was in position in the subchondral bone, I used the compression wheel to medialize the shaft to the fracture site with good  compression.  Once this was done, I tightened the proximal locking bolt down fully and then backed it off a quarter of turn per protocol.  At this point, a distal interlock screw was placed through the insertion jig measuring 38 mm.  Once this was done,  we removed the insertion jig.  We obtained final radiographs in the AP and lateral planes.  I then irrigated all wounds.  The proximal wound was closed in layers with #1 Vicryl in the gluteal fascia followed by 2-0 Vicryl and a running Monocryl stitch.   The two distal incisions were closed with 2-0 Vicryl.  All wounds were then cleaned, dried, and dressed sterilely using surgical glue and Aquacel dressings.  She was then awoken from anesthesia and brought to the  recovery room in stable condition  tolerating the procedure well.  Postoperatively, she will be partial weightbearing, to monitor for wound healing.  I will see her back in the office in 2 weeks.  Disposition will be pending her physical therapy assessment and functionality.   ROH D: 07/07/2021 12:34:13 pm T: 07/07/2021 2:35:00 pm  JOB: [id number]/ [id number]

## 2021-07-07 NOTE — Progress Notes (Signed)
SLP Cancellation Note  Patient Details Name: Rebecca King MRN: [id number] DOB: [birth year]   Cancelled treatment:        Pt currently is in a procedure (bed is NONE). Will continue efforts.    Houston Siren 07/07/2021, 11:12 AM  Orbie Pyo Colvin Caroli.Ed Risk analyst [phone] Office [phone]

## 2021-07-07 NOTE — Anesthesia Preprocedure Evaluation (Addendum)
Anesthesia Evaluation  Patient identified by MRN, date of birth, ID band Patient awake    Reviewed: Allergy & Precautions, NPO status , Patient's Chart, lab work & pertinent test results  History of Anesthesia Complications Negative for: history of anesthetic complications  Airway Mallampati: III  TM Distance: >3 FB Neck ROM: Full    Dental  (+) Dental Advisory Given, Partial Lower   Pulmonary former smoker,    Pulmonary exam normal        Cardiovascular + CAD and +CHF  Normal cardiovascular exam   '18 TTE - mild LVH. EF 55%. Grade 1 diastolic dysfunction. Mild MR    Neuro/Psych negative neurological ROS  negative psych ROS   GI/Hepatic Neg liver ROS, GERD  Controlled,  Endo/Other  negative endocrine ROS  Renal/GU CRFRenal disease     Musculoskeletal  (+) Arthritis ,   Abdominal   Peds  Hematology  (+) anemia ,  CLL    Anesthesia Other Findings   Reproductive/Obstetrics                            Anesthesia Physical Anesthesia Plan  ASA: 3  Anesthesia Plan: General   Post-op Pain Management: Tylenol PO (pre-op)   Induction: Intravenous  PONV Risk Score and Plan: 4 or greater and Treatment may vary due to age or medical condition, Ondansetron and TIVA  Airway Management Planned: Oral ETT  Additional Equipment: None  Intra-op Plan:   Post-operative Plan: Extubation in OR  Informed Consent: I have reviewed the patients History and Physical, chart, labs and discussed the procedure including the risks, benefits and alternatives for the proposed anesthesia with the patient or authorized representative who has indicated his/her understanding and acceptance.   Patient has DNR.  Discussed DNR with patient and Suspend DNR.   Dental advisory given  Plan Discussed with: CRNA and Anesthesiologist  Anesthesia Plan Comments:        Anesthesia Quick Evaluation

## 2021-07-08 ENCOUNTER — Encounter (HOSPITAL_COMMUNITY): Payer: Self-pay | Admitting: Orthopedic Surgery

## 2021-07-08 ENCOUNTER — Inpatient Hospital Stay (HOSPITAL_COMMUNITY): Payer: Medicare Other

## 2021-07-08 DIAGNOSIS — I361 Nonrheumatic tricuspid (valve) insufficiency: Secondary | ICD-10-CM | POA: Diagnosis not present

## 2021-07-08 DIAGNOSIS — I350 Nonrheumatic aortic (valve) stenosis: Secondary | ICD-10-CM

## 2021-07-08 LAB — CBC
HCT: 29.8 % — ABNORMAL LOW (ref 36.0–46.0)
Hemoglobin: 9.5 g/dL — ABNORMAL LOW (ref 12.0–15.0)
MCH: 31.1 pg (ref 26.0–34.0)
MCHC: 31.9 g/dL (ref 30.0–36.0)
MCV: 97.7 fL (ref 80.0–100.0)
Platelets: 208 10*3/uL (ref 150–400)
RBC: 3.05 MIL/uL — ABNORMAL LOW (ref 3.87–5.11)
RDW: 14.6 % (ref 11.5–15.5)
WBC: 16.9 10*3/uL — ABNORMAL HIGH (ref 4.0–10.5)
nRBC: 0 % (ref 0.0–0.2)

## 2021-07-08 LAB — ECHOCARDIOGRAM COMPLETE
AR max vel: 1.49 cm2
AV Area VTI: 1.6 cm2
AV Area mean vel: 1.43 cm2
AV Mean grad: 8.5 mmHg
AV Peak grad: 17 mmHg
Ao pk vel: 2.06 m/s
Area-P 1/2: 4.71 cm2
Height: 63 in
S' Lateral: 2.2 cm
Weight: 2620.83 oz

## 2021-07-08 LAB — BASIC METABOLIC PANEL
Anion gap: 6 (ref 5–15)
BUN: 13 mg/dL (ref 8–23)
CO2: 23 mmol/L (ref 22–32)
Calcium: 9.1 mg/dL (ref 8.9–10.3)
Chloride: 108 mmol/L (ref 98–111)
Creatinine, Ser: 0.74 mg/dL (ref 0.44–1.00)
GFR, Estimated: >60 mL/min (ref >60)
Glucose, Bld: 114 mg/dL — ABNORMAL HIGH (ref 70–99)
Potassium: 3.9 mmol/L (ref 3.5–5.1)
Sodium: 137 mmol/L (ref 135–145)

## 2021-07-08 MED ORDER — ASPIRIN 81 MG PO TABS: 81.0000 mg | ORAL_TABLET | Freq: Every day | ORAL | Status: DC

## 2021-07-08 MED ORDER — ENSURE SURGERY PO LIQD
237.0000 mL | Freq: Two times a day (BID) | ORAL | Status: DC
Start: 2021-07-08 — End: 2021-07-10
  Administered 2021-07-08 – 2021-07-09 (×3): 237 mL via ORAL
  Filled 2021-07-08 (×4): qty 237

## 2021-07-08 MED ORDER — ADULT MULTIVITAMIN W/MINERALS CH
1.0000 | ORAL_TABLET | Freq: Every day | ORAL | Status: DC
Start: 2021-07-08 — End: 2021-07-10
  Administered 2021-07-08 – 2021-07-09 (×2): 1 via ORAL
  Filled 2021-07-08 (×2): qty 1

## 2021-07-08 MED ORDER — ASPIRIN EC 81 MG PO TBEC
81.0000 mg | DELAYED_RELEASE_TABLET | Freq: Every day | ORAL | Status: DC
  Administered 2021-07-09: 81 mg via ORAL
  Filled 2021-07-08: qty 1

## 2021-07-08 MED ORDER — POLYETHYLENE GLYCOL 3350 17 G PO PACK
17.0000 g | PACK | Freq: Every day | ORAL | Status: DC
  Administered 2021-07-08 – 2021-07-09 (×2): 17 g via ORAL
  Filled 2021-07-08 (×2): qty 1

## 2021-07-08 MED ORDER — SENNA 8.6 MG PO TABS
2.0000 | ORAL_TABLET | Freq: Every day | ORAL | Status: DC
  Administered 2021-07-08 – 2021-07-09 (×2): 17.2 mg via ORAL
  Filled 2021-07-08 (×2): qty 2

## 2021-07-08 MED ORDER — ENOXAPARIN SODIUM 40 MG/0.4ML IJ SOSY
40.0000 mg | PREFILLED_SYRINGE | INTRAMUSCULAR | Status: DC
  Administered 2021-07-08 – 2021-07-09 (×2): 40 mg via SUBCUTANEOUS
  Filled 2021-07-08: qty 0.4

## 2021-07-08 NOTE — Progress Notes (Signed)
Initial Nutrition Assessment  INTERVENTION:   -Ensure Surgery PO BID, each provides 330 kcals and 18g protein   -Multivitamin with minerals daily  NUTRITION DIAGNOSIS:   Increased nutrient needs related to post-op healing, hip fracture as evidenced by estimated needs.  GOAL:   Patient will meet greater than or equal to 90% of their needs  MONITOR:   PO intake, Supplement acceptance, Labs, Weight trends, I & O's  REASON FOR ASSESSMENT:   Consult Hip fracture protocol  ASSESSMENT:   87 y.o. female with medical history significant of CLL, osteopenia, GERD, HLD, CKD stage3, diastolic CHF, Aortic aneurysm, CAD  Presented with   mechanical fall on left hip.  Patient is s/p Open reduction and internal fixation of left intertrochanteric femur fracture with intramedullary nail on 11/24.  Now on regular diet, eating.  Per chart review, pt was combative and confused overnight. More alert now.  Will order Ensure Surgery supplements to support healing from surgery.  Per weight records, weight has increased since 9/7.  Medications: Colace, Ferrous sulfate, Miralax, Senokot  Labs reviewed.  NUTRITION - FOCUSED PHYSICAL EXAM:  Unable to complete  Diet Order:   Diet Order             Diet regular Room service appropriate? Yes; Fluid consistency: Thin  Diet effective now                   EDUCATION NEEDS:   No education needs have been identified at this time  Skin:  Skin Assessment: Skin Integrity Issues: Skin Integrity Issues:: Incisions Incisions: 11/24- left hip  Last BM:  11/23  Height:   Ht Readings from Last 1 Encounters:  07/07/21 5\' 3"  (1.6 m)    Weight:   Wt Readings from Last 1 Encounters:  07/07/21 74.3 kg    BMI:  Body mass index is 29.02 kg/m.  Estimated Nutritional Needs:   Kcal:  1500-1700  Protein:  65-80g  Fluid:  1.7L/day   Clayton Bibles, MS, RD, LDN Inpatient Clinical Dietitian Contact information available via Amion

## 2021-07-08 NOTE — NC FL2 (Signed)
[place] LEVEL OF CARE SCREENING TOOL     IDENTIFICATION  Patient Name: Rebecca King Birthdate: Sep 28, 1925 Sex: female Admission Date (Current Location): 07/06/2021  [facility] and [place] Number:  Herbalist and Address:  [facility],  [address] [place], [place]      Provider Number: [id number]  Attending Physician Name and Address:  Gwynne Edinger, MD  Relative Name and Phone Number:  [name], Char [facility])   [phone]    Current Level of Care: Hospital Recommended Level of Care: [place] Prior Approval Number:    Date Approved/Denied:   PASRR Number: [id number]  Discharge Plan: SNF    Current Diagnoses: Patient Active Problem List   Diagnosis Date Noted   Closed left hip fracture ([place]) 07/07/2021   Mild cognitive disorder 07/07/2021   Hyperlipidemia    Peripheral neuropathy 02/06/2019   Osteopenia 02/06/2019   Hypercholesterolemia 02/06/2019   Hypercalcemia 02/06/2019   CLL (chronic lymphocytic leukemia) ([place]) 02/06/2019   Stage III chronic kidney disease ([place]) 02/06/2019   Presbycusis of both ears 01/15/2018   Impacted cerumen of right ear 01/15/2018   Osteoarthritis of knee 09/21/2017   CAD in native artery 07/08/2017   Ascending aortic aneurysm 07/08/2017   Sensorineural hearing loss (SNHL), bilateral 12/22/2016   Angina pectoris ([place]) 12/25/2011   GERD (gastroesophageal reflux disease) 12/25/2011   Iron deficiency anemia 05/2010   Chronic diastolic CHF (congestive heart failure) ([place]) 05/2010    Orientation RESPIRATION BLADDER Height & Weight     Self, Place, Situation  Normal External catheter Weight: 74.3 kg Height:  5\' 3"  (160 cm)  BEHAVIORAL SYMPTOMS/MOOD NEUROLOGICAL BOWEL NUTRITION STATUS      Continent Diet (see d/c summary)  AMBULATORY STATUS COMMUNICATION OF NEEDS Skin   Limited Assist Verbally Surgical wounds (L HIp)                       Personal Care  Assistance Level of Assistance  Bathing, Feeding, Dressing Bathing Assistance: Maximum assistance Feeding assistance: Independent       Functional Limitations Info  Sight, Hearing, Speech Sight Info: Adequate Hearing Info: Adequate Speech Info: Adequate    SPECIAL CARE FACTORS FREQUENCY  PT (By licensed PT), OT (By licensed OT)     PT Frequency: 5X/W OT Frequency: 5X/W            Contractures Contractures Info: Not present    Additional Factors Info  Code Status, Allergies Code Status Info: DNR Allergies Info: Amoxicillin   Macrolides And Ketolides   Metoprolol   Ciprofloxacin   Macrodantin (Nitrofurantoin)   Sulfa Antibiotics           Current Medications (07/08/2021):  This is the current hospital active medication list Current Facility-Administered Medications  Medication Dose Route Frequency Provider Last Rate Last Admin   acetaminophen (TYLENOL) tablet 325-650 mg  325-650 mg Oral Q6H PRN Irving Copas, PA-C   650 mg at 07/08/21 1013   [START ON 07/09/2021] aspirin EC tablet 81 mg  81 mg Oral Daily Wouk, Ailene Rud, MD       bisacodyl (DULCOLAX) suppository 10 mg  10 mg Rectal Daily PRN Irving Copas, PA-C       diphenhydrAMINE (BENADRYL) 12.5 MG/5ML elixir 12.5-25 mg  12.5-25 mg Oral Q4H PRN Irving Copas, PA-C       docusate sodium (COLACE) capsule 100 mg  100 mg Oral BID Irving Copas, PA-C  100 mg at 07/08/21 1013   enoxaparin (LOVENOX) injection 40 mg  40 mg Subcutaneous Q24H Wouk, Ailene Rud, MD       feeding supplement (ENSURE SURGERY) liquid 237 mL  237 mL Oral BID BM Wouk, Ailene Rud, MD   237 mL at 07/08/21 1201   ferrous sulfate tablet 325 mg  325 mg Oral TID PC Irving Copas, PA-C   325 mg at 07/08/21 1012   HYDROcodone-acetaminophen (NORCO) 7.5-325 MG per tablet 1-2 tablet  1-2 tablet Oral Q4H PRN Irving Copas, PA-C       HYDROcodone-acetaminophen (NORCO/VICODIN) 5-325 MG per tablet 1-2 tablet  1-2 tablet Oral Q4H PRN  Irving Copas, PA-C   1 tablet at 07/07/21 1844   menthol-cetylpyridinium (CEPACOL) lozenge 3 mg  1 lozenge Oral PRN Irving Copas, PA-C   3 mg at 07/07/21 1732   Or   phenol (CHLORASEPTIC) mouth spray 1 spray  1 spray Mouth/Throat PRN Irving Copas, PA-C       methocarbamol (ROBAXIN) tablet 500 mg  500 mg Oral Q6H PRN Irving Copas, PA-C       Or   methocarbamol (ROBAXIN) 500 mg in dextrose 5 % 50 mL IVPB  500 mg Intravenous Q6H PRN Irving Copas, PA-C 100 mL/hr at 07/08/21 0536 500 mg at 07/08/21 0536   metoCLOPramide (REGLAN) tablet 5-10 mg  5-10 mg Oral Q8H PRN Irving Copas, PA-C       Or   metoCLOPramide (REGLAN) injection 5-10 mg  5-10 mg Intravenous Q8H PRN Irving Copas, PA-C       morphine 2 MG/ML injection 0.5-1 mg  0.5-1 mg Intravenous Q2H PRN Irving Copas, PA-C       multivitamin with minerals tablet 1 tablet  1 tablet Oral Daily Wouk, Ailene Rud, MD   1 tablet at 07/08/21 1202   ondansetron (ZOFRAN) tablet 4 mg  4 mg Oral Q6H PRN Irving Copas, PA-C       Or   ondansetron [facility]) injection 4 mg  4 mg Intravenous Q6H PRN Costella Hatcher R, PA-C       polyethylene glycol (MIRALAX / GLYCOLAX) packet 17 g  17 g Oral Daily Wouk, Ailene Rud, MD       rosuvastatin (CRESTOR) tablet 5 mg  5 mg Oral QHS Irving Copas, PA-C   5 mg at 07/07/21 0159   senna (SENOKOT) tablet 17.2 mg  2 tablet Oral Daily Wouk, Ailene Rud, MD         Discharge Medications: Please see discharge summary for a list of discharge medications.  Relevant Imaging Results:  Relevant Lab Results:   Additional Information [place], [place]

## 2021-07-08 NOTE — Progress Notes (Signed)
   Subjective:  Patient reports pain as mild to moderate.  No complaints at the hip.   Objective:   VITALS:   Vitals:   07/07/21 2214 07/08/21 0213 07/08/21 1323 07/08/21 2100  BP: (!) 109/54 (!) 144/62 114/60 (!) 93/58  Pulse: 84 92 87 75  Resp: $Remo'18 16 16 16  'RPsfO$ Temp: 97.8 F (36.6 C) (!) 97.5 F (36.4 C) 99.1 F (37.3 C) 98.4 F (36.9 C)  TempSrc: Oral Oral Oral Oral  SpO2: 100% 100% 94% 96%  Weight:      Height:        Neurovascular intact Sensation intact distally Intact pulses distally Dorsiflexion/Plantar flexion intact Incision: no drainage   Lab Results  Component Value Date   WBC 16.9 (H) 07/08/2021   HGB 9.5 (L) 07/08/2021   HCT 29.8 (L) 07/08/2021   MCV 97.7 07/08/2021   PLT 208 07/08/2021   BMET    Component Value Date/Time   NA 137 07/08/2021 0514   NA 136 01/03/2018 1426   NA 138 04/26/2017 1344   K 3.9 07/08/2021 0514   K 4.5 04/26/2017 1344   CL 108 07/08/2021 0514   CO2 23 07/08/2021 0514   CO2 25 04/26/2017 1344   GLUCOSE 114 (H) 07/08/2021 0514   GLUCOSE 87 04/26/2017 1344   BUN 13 07/08/2021 0514   BUN 18 01/03/2018 1426   BUN 17.1 04/26/2017 1344   CREATININE 0.74 07/08/2021 0514   CREATININE 1.20 (H) 04/20/2021 1401   CREATININE 1.0 04/26/2017 1344   CALCIUM 9.1 07/08/2021 0514   CALCIUM 10.3 04/26/2017 1344   EGFR 51 (L) 04/26/2017 1344   GFRNONAA >60 07/08/2021 0514   GFRNONAA 42 (L) 04/20/2021 1401     Assessment/Plan: 1 Day Post-Op   Principal Problem:   Closed left hip fracture (HCC) Active Problems:   GERD (gastroesophageal reflux disease)   CAD in native artery   Ascending aortic aneurysm   CLL (chronic lymphocytic leukemia) (HCC)   Stage III chronic kidney disease (HCC)   Hyperlipidemia   Iron deficiency anemia   Chronic diastolic CHF (congestive heart failure) (HCC)   Advance diet Up with therapy Ok for 50% WB with walker    Armond Hang 07/08/2021, 10:32 PM

## 2021-07-08 NOTE — Evaluation (Signed)
Occupational Therapy Evaluation Patient Details Name: Rebecca King MRN: [id number] DOB: [birth year] Today's Date: 07/08/2021   History of Present Illness 87 y.o. female admited with fall, L IT hip fx, s/p IM nail on 07/07/21, PWB. Pt with medical history significant of CLL, osteopenia, GERD, HLD, CKD stage3, diastolic CHF, Aortic aneurysm, CAD.   Clinical Impression   Mrs. Rebecca King is a 87 year old woman who presents s/p hip surgery with decreased ROM and strength of LLE and a PWB status, decreased activity tolerance, impaired balance and pain resulting in a sudden decline in functional abilities. .Patient min assist for transfers and max assist for LB ADLs and toileting. Patient will benefit from skilled OT services while in hospital to improve deficits and learn compensatory strategies as needed in order to return to PLOF.       Recommendations for follow up therapy are one component of a multi-disciplinary discharge planning process, led by the attending physician.  Recommendations may be updated based on patient status, additional functional criteria and insurance authorization.   Follow Up Recommendations  Skilled nursing-short term rehab (<3 hours/day)    Assistance Recommended at Discharge Frequent or constant Supervision/Assistance  Functional Status Assessment  Patient has had a recent decline in their functional status and demonstrates the ability to make significant improvements in function in a reasonable and predictable amount of time.  Equipment Recommendations  None recommended by OT    Recommendations for Other Services       Precautions / Restrictions Precautions Precautions: Fall Precaution Comments: denies prior falls in past 6 months Restrictions Weight Bearing Restrictions: Yes LLE Weight Bearing: Partial weight bearing LLE Partial Weight Bearing Percentage or Pounds: 50      Mobility Bed Mobility Overal bed mobility: Needs Assistance Bed Mobility:  Supine to Sit     Supine to sit: Mod assist;HOB elevated     General bed mobility comments: assist to pivot hips, support LLE, used rail, HOB up, gait belt used as LLE lifter    Transfers Overall transfer level: Needs assistance Equipment used: Rolling walker (2 wheels) Transfers: Sit to/from Stand;Bed to chair/wheelchair/BSC Sit to Stand: Min assist     Step pivot transfers: Min assist     General transfer comment: VCs hand placement, min A to power up, took several steps to 3 in 1, then to recliner, good adherence to [facility] status      Balance Overall balance assessment: Needs assistance   Sitting balance-Leahy Scale: Good     Standing balance support: Bilateral upper extremity supported;Reliant on assistive device for balance Standing balance-Leahy Scale: Poor                             ADL either performed or assessed with clinical judgement   ADL Overall ADL's : Needs assistance/impaired     Grooming: Set up;Sitting   Upper Body Bathing: Set up;Sitting   Lower Body Bathing: Moderate assistance;Sit to/from stand   Upper Body Dressing : Set up;Sitting   Lower Body Dressing: Maximal assistance;Sit to/from stand   Toilet Transfer: Minimal assistance;BSC/3in1;Rolling walker (2 wheels)   Toileting- Clothing Manipulation and Hygiene: Maximal assistance;Sit to/from stand Toileting - Clothing Manipulation Details (indicate cue type and reason): for clothing management     Functional mobility during ADLs: Minimal assistance;Rolling walker (2 wheels)       Vision   Vision Assessment?: No apparent visual deficits     Perception     Praxis  Pertinent Vitals/Pain Pain Assessment: Faces Faces Pain Scale: Hurts little more Breathing: normal Negative Vocalization: none Facial Expression: smiling or inexpressive Body Language: relaxed Consolability: no need to console PAINAD Score: 0 Pain Location: L hip with movement Pain Descriptors /  Indicators: Operative site guarding Pain Intervention(s): Monitored during session;Premedicated before session     Hand Dominance Right   Extremity/Trunk Assessment Upper Extremity Assessment Upper Extremity Assessment: RUE deficits/detail;LUE deficits/detail RUE Deficits / Details: WFL ROM, 5/5 strength RUE Sensation: WNL RUE Coordination: WNL LUE Deficits / Details: WFL ROM, 5/5 strength LUE Sensation: WNL LUE Coordination: WNL   Lower Extremity Assessment Lower Extremity Assessment: Defer to PT evaluation LLE Deficits / Details: hip -3/5, AAROM WFL LLE Sensation: WNL LLE Coordination: WNL   Cervical / Trunk Assessment Cervical / Trunk Assessment: Normal   Communication Communication Communication: No difficulties   Cognition Arousal/Alertness: Awake/alert Behavior During Therapy: WFL for tasks assessed/performed Overall Cognitive Status: Impaired/Different from baseline Area of Impairment: Memory                     Memory: Decreased short-term memory         General Comments: husband reports pt was confused overnight, narcotics have been held this morning, she seems to be clearing, can follow commands, correctly recalls 50% PWB status at end of session     General Comments       Exercises General Exercises - Lower Extremity Ankle Circles/Pumps: AROM;Both;10 reps;Supine Heel Slides: AAROM;Left;Supine;10 reps Hip ABduction/ADduction: AAROM;Left;10 reps;Supine   Shoulder Instructions      Home Living Family/patient expects to be discharged to:: Skilled nursing facility                                 Additional Comments: from [place]      Prior Functioning/Environment Prior Level of Function : Independent/Modified Independent             Mobility Comments: Uses a cane. ADLs Comments: Independent.        OT Problem List: Decreased strength;Decreased range of motion;Decreased activity tolerance;Impaired balance  (sitting and/or standing);Decreased knowledge of use of DME or AE;Pain      OT Treatment/Interventions: Self-care/ADL training;Therapeutic exercise;DME and/or AE instruction;Therapeutic activities;Splinting;Patient/family education;Balance training    OT Goals(Current goals can be found in the care plan section) Acute Rehab OT Goals Patient Stated Goal: return to normal OT Goal Formulation: With patient Time For Goal Achievement: 07/22/21 Potential to Achieve Goals: Good  OT Frequency: Min 2X/week   Barriers to D/C:            Co-evaluation   Reason for Co-Treatment: For patient/therapist safety;Necessary to address cognition/behavior during functional activity PT goals addressed during session: Mobility/safety with mobility;Balance;Proper use of DME;Strengthening/ROM        AM-PAC OT "6 Clicks" Daily Activity     Outcome Measure Help from another person eating meals?: None Help from another person taking care of personal grooming?: A Little Help from another person toileting, which includes using toliet, bedpan, or urinal?: A Lot Help from another person bathing (including washing, rinsing, drying)?: A Lot Help from another person to put on and taking off regular upper body clothing?: A Little Help from another person to put on and taking off regular lower body clothing?: A Lot 6 Click Score: 16   End of Session Equipment Utilized During Treatment: Gait belt;Rolling walker (2 wheels) Nurse Communication: Mobility status  Activity  Tolerance: Patient tolerated treatment well Patient left: in chair;with call bell/phone within reach;with chair alarm set  OT Visit Diagnosis: Other abnormalities of gait and mobility (R26.89);Pain;Other symptoms and signs involving cognitive function                Time: 1035-1101 OT Time Calculation (min): 26 min Charges:  OT General Charges $OT Visit: 1 Visit OT Evaluation $OT Eval Low Complexity: 1 Low  Alima Naser, OTR/L [place]  Office [phone] Pager: [place] 07/08/2021, 1:09 PM

## 2021-07-08 NOTE — Progress Notes (Signed)
   Subjective:  Patient reports pain as mild to moderate.  No complaints at the hip.  Not happy with the wrist restraints at this time  Objective:   VITALS:   Vitals:   07/07/21 1355 07/07/21 1828 07/07/21 2214 07/08/21 0213  BP: (!) 111/58 (!) 121/59 (!) 109/54 (!) 144/62  Pulse: 61 69 84 92  Resp: $Remo'16 20 18 16  'JNbCF$ Temp: (!) 97.4 F (36.3 C) 98.1 F (36.7 C) 97.8 F (36.6 C) (!) 97.5 F (36.4 C)  TempSrc: Oral Oral Oral Oral  SpO2: 100% 98% 100% 100%  Weight: 74.3 kg     Height: $Remove'5\' 3"'iKkIPXc$  (1.6 m)       Neurovascular intact Sensation intact distally Intact pulses distally Dorsiflexion/Plantar flexion intact Incision: no drainage   Lab Results  Component Value Date   WBC 16.9 (H) 07/08/2021   HGB 9.5 (L) 07/08/2021   HCT 29.8 (L) 07/08/2021   MCV 97.7 07/08/2021   PLT 208 07/08/2021   BMET    Component Value Date/Time   NA 137 07/08/2021 0514   NA 136 01/03/2018 1426   NA 138 04/26/2017 1344   K 3.9 07/08/2021 0514   K 4.5 04/26/2017 1344   CL 108 07/08/2021 0514   CO2 23 07/08/2021 0514   CO2 25 04/26/2017 1344   GLUCOSE 114 (H) 07/08/2021 0514   GLUCOSE 87 04/26/2017 1344   BUN 13 07/08/2021 0514   BUN 18 01/03/2018 1426   BUN 17.1 04/26/2017 1344   CREATININE 0.74 07/08/2021 0514   CREATININE 1.20 (H) 04/20/2021 1401   CREATININE 1.0 04/26/2017 1344   CALCIUM 9.1 07/08/2021 0514   CALCIUM 10.3 04/26/2017 1344   EGFR 51 (L) 04/26/2017 1344   GFRNONAA >60 07/08/2021 0514   GFRNONAA 42 (L) 04/20/2021 1401     Assessment/Plan: 1 Day Post-Op   Principal Problem:   Closed left hip fracture (HCC) Active Problems:   GERD (gastroesophageal reflux disease)   CAD in native artery   Ascending aortic aneurysm   CLL (chronic lymphocytic leukemia) (HCC)   Stage III chronic kidney disease (HCC)   Hyperlipidemia   Iron deficiency anemia   Chronic diastolic CHF (congestive heart failure) ([place])   Advance diet Up with therapy Ok for 50% WB with walker     Nicholes Stairs 07/08/2021, 8:23 AM   Geralynn Rile, MD [phone]

## 2021-07-08 NOTE — Progress Notes (Signed)
RN entered pts room and found pt attemtping to get OOB. Bed alarm going off. Pt stated she had to use the bathroom. RN reminded pt she had a purewick & just had surgery. Pt states "I did not have surgery what are you talking about, you are lying to me and I dont know why yall have me in this place." RN attempted to reorient pt. Pt states " I did not have surgery let me see if I see the scar." Pt attempts to remove her surgical dressings. RN attempts to reorient pt stating it is not safe for her to get oob or remove her surgical dressing due to risk of infection, etc. Pt becomes verbally & physically aggressive towards staff, continuously attempting to get OOB & adamantly attempting to remove surgical dressing. Pt aox1. RN attempted to call pts husband, no answer.  Bilateral wrist restraints applied. Pt safety ensured. Bed in low position, call bell within reach, fall mats in place, bed alarm on.

## 2021-07-08 NOTE — Evaluation (Signed)
Physical Therapy Evaluation Patient Details Name: Rebecca King MRN: [id number] DOB: 20-Apr-1926 Today's Date: 07/08/2021  History of Present Illness  87 y.o. female admited with fall, L IT hip fx, s/p IM nail on 07/07/21, PWB. Pt with medical history significant of CLL, osteopenia, GERD, HLD, CKD stage3, diastolic CHF, Aortic aneurysm, CAD.  Clinical Impression  Pt admitted with above diagnosis. Mod assist for bed mobility, min A for transfers. Pt took several pivotal steps from bed to bedside commode, then to recliner with good adherence to PWB status. Pt was confused overnight, but seems to be clearing cognitively, narcotics were held this morning.  Pt currently with functional limitations due to the deficits listed below (see PT Problem List). Pt will benefit from skilled PT to increase their independence and safety with mobility to allow discharge to the venue listed below.          Recommendations for follow up therapy are one component of a multi-disciplinary discharge planning process, led by the attending physician.  Recommendations may be updated based on patient status, additional functional criteria and insurance authorization.  Follow Up Recommendations Skilled nursing-short term rehab (<3 hours/day)    Assistance Recommended at Discharge Frequent or constant Supervision/Assistance  Functional Status Assessment Patient has had a recent decline in their functional status and demonstrates the ability to make significant improvements in function in a reasonable and predictable amount of time.  Equipment Recommendations  Other (comment) (TBD at SNF)    Recommendations for Other Services       Precautions / Restrictions Precautions Precautions: Fall Precaution Comments: denies prior falls in past 6 months Restrictions Weight Bearing Restrictions: Yes LLE Weight Bearing: Partial weight bearing LLE Partial Weight Bearing Percentage or Pounds: 50%      Mobility  Bed  Mobility Overal bed mobility: Needs Assistance Bed Mobility: Supine to Sit     Supine to sit: Mod assist;HOB elevated     General bed mobility comments: assist to pivot hips, support LLE, used rail, HOB up, gait belt used as LLE lifter    Transfers Overall transfer level: Needs assistance Equipment used: Rolling walker (2 wheels) Transfers: Sit to/from Stand;Bed to chair/wheelchair/BSC Sit to Stand: Min assist   Step pivot transfers: Min assist       General transfer comment: VCs hand placement, min A to power up, took several steps to 3 in 1, then to recliner, good adherence to PWB status    Ambulation/Gait Ambulation/Gait assistance: Min assist Gait Distance (Feet): 3 Feet Assistive device: Rolling walker (2 wheels) Gait Pattern/deviations: Step-to pattern;Decreased step length - right;Decreased step length - left       General Gait Details: VCs sequencing, no loss of balance, pivotal steps to 3 in 1 then to recliner  Stairs            Wheelchair Mobility    Modified Rankin (Stroke Patients Only)       Balance Overall balance assessment: Needs assistance   Sitting balance-Leahy Scale: Good     Standing balance support: Bilateral upper extremity supported;Reliant on assistive device for balance Standing balance-Leahy Scale: Poor                               Pertinent Vitals/Pain Pain Assessment: Faces Faces Pain Scale: Hurts little more Breathing: normal Negative Vocalization: none Facial Expression: smiling or inexpressive Body Language: relaxed Consolability: no need to console PAINAD Score: 0 Pain Location: L hip with movement  Pain Descriptors / Indicators: Operative site guarding Pain Intervention(s): Limited activity within patient's tolerance;Monitored during session;Premedicated before session;Ice applied    Home Living Family/patient expects to be discharged to:: Skilled nursing facility                    Additional Comments: from [place]    Prior Function Prior Level of Function : Independent/Modified Independent                     Hand Dominance        Extremity/Trunk Assessment   Upper Extremity Assessment Upper Extremity Assessment: Defer to OT evaluation    Lower Extremity Assessment Lower Extremity Assessment: LLE deficits/detail LLE Deficits / Details: hip -3/5, AAROM WFL LLE Sensation: WNL LLE Coordination: WNL    Cervical / Trunk Assessment Cervical / Trunk Assessment: Normal  Communication   Communication: No difficulties  Cognition Arousal/Alertness: Awake/alert;Suspect due to medications Behavior During Therapy: [facility] for tasks assessed/performed Overall Cognitive Status: Impaired/Different from baseline Area of Impairment: Memory                     Memory: Decreased short-term memory         General Comments: husband reports pt was confused overnight, narcotics have been held this morning, she seems to be clearing, can follow commands, correctly recalls 50% PWB status at end of session        General Comments      Exercises General Exercises - Lower Extremity Ankle Circles/Pumps: AROM;Both;10 reps;Supine Heel Slides: AAROM;Left;Supine;10 reps Hip ABduction/ADduction: AAROM;Left;10 reps;Supine   Assessment/Plan    PT Assessment Patient needs continued PT services  PT Problem List Decreased mobility;Decreased activity tolerance;Decreased balance;Pain;Decreased cognition;Decreased knowledge of use of DME;Decreased strength       PT Treatment Interventions Gait training;Therapeutic activities;Functional mobility training;Therapeutic exercise;Patient/family education    PT Goals (Current goals can be found in the Care Plan section)  Acute Rehab PT Goals Patient Stated Goal: likes to walk and play word games on her ipad PT Goal Formulation: With patient Time For Goal Achievement: 07/22/21 Potential to Achieve Goals:  Good    Frequency Min 3X/week   Barriers to discharge        Co-evaluation PT/OT/SLP Co-Evaluation/Treatment: Yes Reason for Co-Treatment: For patient/therapist safety;To address functional/ADL transfers PT goals addressed during session: Mobility/safety with mobility;Balance;Proper use of DME;Strengthening/ROM         AM-PAC PT "6 Clicks" Mobility  Outcome Measure Help needed turning from your back to your side while in a flat bed without using bedrails?: A Little Help needed moving from lying on your back to sitting on the side of a flat bed without using bedrails?: A Lot Help needed moving to and from a bed to a chair (including a wheelchair)?: A Little Help needed standing up from a chair using your arms (e.g., wheelchair or bedside chair)?: A Little Help needed to walk in hospital room?: A Lot Help needed climbing 3-5 steps with a railing? : A Lot 6 Click Score: 15    End of Session Equipment Utilized During Treatment: Gait belt Activity Tolerance: Patient tolerated treatment well Patient left: in chair;with call bell/phone within reach;with chair alarm set Nurse Communication: Mobility status PT Visit Diagnosis: Difficulty in walking, not elsewhere classified (R26.2);Pain Pain - Right/Left: Left Pain - part of body: Hip    Time: 5956-3875 PT Time Calculation (min) (ACUTE ONLY): 25 min   Charges:   PT Evaluation $PT Eval  Moderate Complexity: 1 Mod         Philomena Doheny PT 07/08/2021  Acute Rehabilitation Services Pager [phone] Office [phone]

## 2021-07-08 NOTE — Plan of Care (Signed)
  Problem: Education: Goal: Knowledge of General Education information will improve Description: Including pain rating scale, medication(s)/side effects and non-pharmacologic comfort measures Outcome: Progressing   Problem: Health Behavior/Discharge Planning: Goal: Ability to manage health-related needs will improve Outcome: Progressing   Problem: Clinical Measurements: Goal: Ability to maintain clinical measurements within normal limits will improve Outcome: Progressing Goal: Will remain free from infection Outcome: Progressing Goal: Diagnostic test results will improve Outcome: Progressing Goal: Cardiovascular complication will be avoided Outcome: Progressing   Problem: Activity: Goal: Risk for activity intolerance will decrease Outcome: Progressing   Problem: Nutrition: Goal: Adequate nutrition will be maintained Outcome: Progressing   Problem: Elimination: Goal: Will not experience complications related to bowel motility Outcome: Progressing   Problem: Pain Managment: Goal: General experience of comfort will improve Outcome: Progressing   Problem: Safety: Goal: Ability to remain free from injury will improve Outcome: Progressing   Problem: Skin Integrity: Goal: Risk for impaired skin integrity will decrease Outcome: Progressing   Problem: Safety: Goal: Non-violent Restraint(s) Outcome: Progressing   Problem: Safety: Goal: Non-violent Restraint(s) Outcome: Progressing

## 2021-07-08 NOTE — Progress Notes (Addendum)
PROGRESS NOTE    Rebecca King  CNO:[id number] DOB: 1925/12/07 DOA: 07/06/2021 PCP: [name], MD     Brief Narrative:   From admission h and p Rebecca King is a 87 y.o. female with medical history significant of CLL, osteopenia, GERD, HLD, CKD stage3, diastolic CHF, Aortic aneurysm, CAD Presented with   mechanical fall on left hip while setting the table tripped on a rug her body twisted and she lended on left side Pain unable to get up Given fentanyl  50 mcg At baseline walks with a cane, can walk up to 200 feet with out SOB/CP limited only by bad knees Have not had her nitro for the past 35m     Assessment & Plan:   Principal Problem:   Closed left hip fracture (HCC) Active Problems:   GERD (gastroesophageal reflux disease)   CAD in native artery   Ascending aortic aneurysm   CLL (chronic lymphocytic leukemia) (HCC)   Stage III chronic kidney disease ([place])   Hyperlipidemia   Iron deficiency anemia   Chronic diastolic CHF (congestive heart failure) ([place])  # Closed left intertrochanteric hip fracture S/p operative repair with orthopedics on 11/25. Pain well controlled - 50% WBAT - oxy for pain control, bowel regimen ordered - start lovenox - no sig anemia on today's labs - PT/OT advising snf, TOC working on that - f/u 2 wks Dr. Noralee Chars  # Delirium This morning, mild - delirium precautions, pain control, etc.  # CLL Chronic, is being monitored by oncology - outpt f/u  # CAD Asymptomatic - cont statin, re-start aspirin  DVT prophylaxis: lovenox Code Status: DNR Family Communication: son updated @ bedside 11/25  Level of care: Telemetry Status is: Inpatient  Remains inpatient appropriate because: severity of illness        Consultants:  orthopedics  Procedures: Open reduction and internal fixation of left intertrochanteric femur fracture with intramedullary nail on 11/24  Antimicrobials:  none    Subjective: No pain. Worked with  PT. Tolerating diet.  Objective: Vitals:   07/07/21 1355 07/07/21 1828 07/07/21 2214 07/08/21 0213  BP: (!) 111/58 (!) 121/59 (!) 109/54 (!) 144/62  Pulse: 61 69 84 92  Resp: 16 20 18 16   Temp: (!) 97.4 F (36.3 C) 98.1 F (36.7 C) 97.8 F (36.6 C) (!) 97.5 F (36.4 C)  TempSrc: Oral Oral Oral Oral  SpO2: 100% 98% 100% 100%  Weight: 74.3 kg     Height: 5\' 3"  (1.6 m)       Intake/Output Summary (Last 24 hours) at 07/08/2021 1308 Last data filed at 07/08/2021 1125 Gross per 24 hour  Intake 912.75 ml  Output 1850 ml  Net -937.25 ml   Filed Weights   07/07/21 1355  Weight: 74.3 kg    Examination:  General exam: Appears calm and comfortable  Respiratory system: Clear to auscultation. Respiratory effort normal. Cardiovascular system: S1 & S2 heard, RRR. No JVD, murmurs, rubs, gallops or clicks. No pedal edema. Gastrointestinal system: Abdomen is nondistended, soft and nontender. No organomegaly or masses felt. Normal bowel sounds heard. Central nervous system: Alert and oriented. No focal neurological deficits. Extremities: can move left leg. Warm left foot. LE sensation intact b/l Skin: bandage left thigh.  Psychiatry: Judgement and insight appear normal. Mood & affect appropriate.     Data Reviewed: I have personally reviewed following labs and imaging studies  CBC: Recent Labs  Lab 07/06/21 2335 07/07/21 0435 07/08/21 0514  WBC 18.2* 17.3* 16.9*  NEUTROABS  11.3* 11.8*  --   HGB 10.9* 10.3* 9.5*  HCT 32.6* 31.2* 29.8*  MCV 94.5 94.3 97.7  PLT 260 247 952   Basic Metabolic Panel: Recent Labs  Lab 07/06/21 2335 07/07/21 0435 07/08/21 0514  NA 136 137 137  K 4.3 4.4 3.9  CL 105 109 108  CO2 25 22 23   GLUCOSE 115* 113* 114*  BUN 25* 21 13  CREATININE 0.95 0.87 0.74  CALCIUM 9.4 9.0 9.1  MG 2.1  --   --   PHOS 2.5  --   --    GFR: Estimated Creatinine Clearance: 42.4 mL/min (by C-G formula based on SCr of 0.74 mg/dL). Liver Function Tests: Recent  Labs  Lab 07/06/21 2335  AST 21  ALT 15  ALKPHOS 61  BILITOT 0.8  PROT 6.3*  ALBUMIN 3.9   No results for input(s): LIPASE, AMYLASE in the last 168 hours. No results for input(s): AMMONIA in the last 168 hours. Coagulation Profile: Recent Labs  Lab 07/06/21 2335  INR 0.9   Cardiac Enzymes: Recent Labs  Lab 07/06/21 2335  CKTOTAL 86   BNP (last 3 results) No results for input(s): PROBNP in the last 8760 hours. HbA1C: No results for input(s): HGBA1C in the last 72 hours. CBG: No results for input(s): GLUCAP in the last 168 hours. Lipid Profile: No results for input(s): CHOL, HDL, LDLCALC, TRIG, CHOLHDL, LDLDIRECT in the last 72 hours. Thyroid Function Tests: Recent Labs    07/07/21 0435 07/07/21 1420  TSH 4.845*  --   FREET4  --  1.18*   Anemia Panel: Recent Labs    07/07/21 0435  VITAMINB12 214  FOLATE 21.1  FERRITIN 52  TIBC 283  IRON 37  RETICCTPCT 1.4   Urine analysis:    Component Value Date/Time   COLORURINE STRAW (A) 08/06/2018 0940   APPEARANCEUR CLEAR 08/06/2018 0940   LABSPEC 1.005 08/06/2018 0940   PHURINE 8.0 08/06/2018 0940   GLUCOSEU NEGATIVE 08/06/2018 0940   HGBUR NEGATIVE 08/06/2018 0940   BILIRUBINUR NEGATIVE 08/06/2018 0940   KETONESUR NEGATIVE 08/06/2018 0940   PROTEINUR NEGATIVE 08/06/2018 0940   UROBILINOGEN 0.2 11/01/2012 0420   NITRITE NEGATIVE 08/06/2018 0940   LEUKOCYTESUR NEGATIVE 08/06/2018 0940   Sepsis Labs: @LABRCNTIP (procalcitonin:4,lacticidven:4)  ) Recent Results (from the past 240 hour(s))  Resp Panel by RT-PCR (Flu A&B, Covid) Nasopharyngeal Swab     Status: None   Collection Time: 07/06/21 11:35 PM   Specimen: Nasopharyngeal Swab; Nasopharyngeal(NP) swabs in vial transport medium  Result Value Ref Range Status   SARS Coronavirus 2 by RT PCR NEGATIVE NEGATIVE Final    Comment: (NOTE) SARS-CoV-2 target nucleic acids are NOT DETECTED.  The SARS-CoV-2 RNA is generally detectable in upper  respiratory specimens during the acute phase of infection. The lowest concentration of SARS-CoV-2 viral copies this assay can detect is 138 copies/mL. A negative result does not preclude SARS-Cov-2 infection and should not be used as the sole basis for treatment or other patient management decisions. A negative result may occur with  improper specimen collection/handling, submission of specimen other than nasopharyngeal swab, presence of viral mutation(s) within the areas targeted by this assay, and inadequate number of viral copies(<138 copies/mL). A negative result must be combined with clinical observations, patient history, and epidemiological information. The expected result is Negative.  Fact Sheet for Patients:  EntrepreneurPulse.com.au  Fact Sheet for Healthcare Providers:  IncredibleEmployment.be  This test is no t yet approved or cleared by the [place] and  has been authorized for detection and/or diagnosis of SARS-CoV-2 by FDA under an Emergency Use Authorization (EUA). This EUA will remain  in effect (meaning this test can be used) for the duration of the COVID-19 declaration under Section 564(b)(1) of the Act, 21 U.S.C.section 360bbb-3(b)(1), unless the authorization is terminated  or revoked sooner.       Influenza A by PCR NEGATIVE NEGATIVE Final   Influenza B by PCR NEGATIVE NEGATIVE Final    Comment: (NOTE) The Xpert Xpress SARS-CoV-2/FLU/RSV plus assay is intended as an aid in the diagnosis of influenza from Nasopharyngeal swab specimens and should not be used as a sole basis for treatment. Nasal washings and aspirates are unacceptable for Xpert Xpress SARS-CoV-2/FLU/RSV testing.  Fact Sheet for Patients: EntrepreneurPulse.com.au  Fact Sheet for Healthcare Providers: IncredibleEmployment.be  This test is not yet approved or cleared by the [place] FDA and has been  authorized for detection and/or diagnosis of SARS-CoV-2 by FDA under an Emergency Use Authorization (EUA). This EUA will remain in effect (meaning this test can be used) for the duration of the COVID-19 declaration under Section 564(b)(1) of the Act, 21 U.S.C. section 360bbb-3(b)(1), unless the authorization is terminated or revoked.  Performed at [facility], [place] [address], [place], [place] [address]          Radiology Studies: DG Chest 1 View  Result Date: 07/06/2021 CLINICAL DATA:  Fall with hip fracture EXAM: CHEST  1 VIEW COMPARISON:  02/26/2020 FINDINGS: No focal opacity or pleural effusion. Stable cardiomediastinal silhouette with aortic atherosclerosis. No pneumothorax. IMPRESSION: No active disease. Electronically Signed   By: Donavan Foil M.D.   On: 07/06/2021 23:32   DG Knee Complete 4 Views Left  Result Date: 07/06/2021 CLINICAL DATA:  Recent fall with knee pain, initial encounter EXAM: LEFT KNEE - COMPLETE 4+ VIEW COMPARISON:  None. FINDINGS: Degenerative changes of the left knee joint are seen. No acute fracture or dislocation is seen. Patella is within normal limits. No joint effusion is noted. IMPRESSION: Tricompartmental degenerative changes without acute abnormality. Electronically Signed   By: Inez Catalina M.D.   On: 07/06/2021 23:41   DG C-Arm 1-60 Min-No Report  Result Date: 07/07/2021 Fluoroscopy was utilized by the requesting physician.  No radiographic interpretation.   ECHOCARDIOGRAM COMPLETE  Result Date: 07/08/2021    ECHOCARDIOGRAM REPORT   Patient Name:   Rebecca King Date of Exam: 07/08/2021 Medical Rec #:  [id number]      Height:       63.0 in Accession #:    [id number]     Weight:       163.8 lb Date of Birth:  1926-04-20      BSA:          1.776 m Patient Age:    48 years       BP:           144/62 mmHg Patient Gender: F              HR:           92 bpm. Exam Location:  Inpatient Procedure: 2D Echo, Color Doppler and Cardiac  Doppler Indications:    Aortic aneurysm [facility]) [[id number]]  History:        Patient has prior history of Echocardiogram examinations, most                 recent 08/02/2017. CHF; Risk Factors:Hypertension and  Dyslipidemia. Chronic kidney disease, anemia. Left hip fracture.  Sonographer:    Darlina Sicilian RDCS Referring Phys: Fu[place]  1. Left ventricular ejection fraction, by estimation, is 60 to 65%. The left ventricle has normal function. The left ventricle has no regional wall motion abnormalities. Left ventricular diastolic parameters are consistent with Grade II diastolic dysfunction (pseudonormalization). Elevated left ventricular end-diastolic pressure.  2. Right ventricular systolic function is normal. The right ventricular size is normal. There is normal pulmonary artery systolic pressure.  3. The mitral valve is abnormal. Trivial mitral valve regurgitation. No evidence of mitral stenosis.  4. The aortic valve is tricuspid. There is moderate calcification of the aortic valve. Aortic valve regurgitation is trivial. Mild aortic valve stenosis.  5. Aortic dilatation noted. There is mild dilatation of the ascending aorta, measuring 42 mm.  6. The inferior vena cava is normal in size with greater than 50% respiratory variability, suggesting right atrial pressure of 3 mmHg. FINDINGS  Left Ventricle: Left ventricular ejection fraction, by estimation, is 60 to 65%. The left ventricle has normal function. The left ventricle has no regional wall motion abnormalities. The left ventricular internal cavity size was normal in size. There is  no left ventricular hypertrophy. Left ventricular diastolic parameters are consistent with Grade II diastolic dysfunction (pseudonormalization). Elevated left ventricular end-diastolic pressure. Right Ventricle: The right ventricular size is normal. No increase in right ventricular wall thickness. Right ventricular systolic function is normal.  There is normal pulmonary artery systolic pressure. The tricuspid regurgitant velocity is 2.53 m/s, and  with an assumed right atrial pressure of 3 mmHg, the estimated right ventricular systolic pressure is 14.7 mmHg. Left Atrium: Left atrial size was normal in size. Right Atrium: Right atrial size was normal in size. Pericardium: There is no evidence of pericardial effusion. Mitral Valve: The mitral valve is abnormal. There is mild thickening of the mitral valve leaflet(s). There is mild calcification of the mitral valve leaflet(s). Trivial mitral valve regurgitation. No evidence of mitral valve stenosis. Tricuspid Valve: The tricuspid valve is normal in structure. Tricuspid valve regurgitation is mild . No evidence of tricuspid stenosis. Aortic Valve: The aortic valve is tricuspid. There is moderate calcification of the aortic valve. Aortic valve regurgitation is trivial. Mild aortic stenosis is present. Aortic valve mean gradient measures 8.5 mmHg. Aortic valve peak gradient measures 17.0 mmHg. Aortic valve area, by VTI measures 1.60 cm. Pulmonic Valve: The pulmonic valve was normal in structure. Pulmonic valve regurgitation is not visualized. No evidence of pulmonic stenosis. Aorta: The aortic root is normal in size and structure and aortic dilatation noted. There is mild dilatation of the ascending aorta, measuring 42 mm. Venous: The inferior vena cava is normal in size with greater than 50% respiratory variability, suggesting right atrial pressure of 3 mmHg. IAS/Shunts: No atrial level shunt detected by color flow Doppler.  LEFT VENTRICLE PLAX 2D LVIDd:         4.10 cm   Diastology LVIDs:         2.20 cm   LV e' medial:    7.54 cm/s LV PW:         1.00 cm   LV E/e' medial:  11.9 LV IVS:        1.00 cm   LV e' lateral:   4.58 cm/s LVOT diam:     2.00 cm   LV E/e' lateral: 19.7 LV SV:         62 LV SV Index:   35  LVOT Area:     3.14 cm  RIGHT VENTRICLE RV S prime:     17.60 cm/s TAPSE (M-mode): 2.2 cm LEFT  ATRIUM             Index LA diam:        2.80 cm 1.58 cm/m LA Vol (A2C):   53.5 ml 30.12 ml/m LA Vol (A4C):   53.9 ml 30.34 ml/m LA Biplane Vol: 57.6 ml 32.43 ml/m  AORTIC VALVE AV Area (Vmax):    1.49 cm AV Area (Vmean):   1.43 cm AV Area (VTI):     1.60 cm AV Vmax:           206.00 cm/s AV Vmean:          132.500 cm/s AV VTI:            0.384 m AV Peak Grad:      17.0 mmHg AV Mean Grad:      8.5 mmHg LVOT Vmax:         97.90 cm/s LVOT Vmean:        60.400 cm/s LVOT VTI:          0.196 m LVOT/AV VTI ratio: 0.51  AORTA Ao Root diam: 3.50 cm Ao STJ diam:  3.1 cm Ao Asc diam:  4.20 cm MITRAL VALVE                TRICUSPID VALVE MV Area (PHT): 4.71 cm     TR Peak grad:   25.6 mmHg MV Decel Time: 161 msec     TR Vmax:        253.00 cm/s MV E velocity: 90.00 cm/s MV A velocity: 105.00 cm/s  SHUNTS MV E/A ratio:  0.86         Systemic VTI:  0.20 m                             Systemic Diam: 2.00 cm Jenkins Rouge MD Electronically signed by Jenkins Rouge MD Signature Date/Time: 07/08/2021/10:18:10 AM    Final    DG HIP OPERATIVE UNILAT W OR W/O PELVIS LEFT  Result Date: 07/07/2021 CLINICAL DATA:  87 year old female with left hip fracture EXAM: OPERATIVE LEFT HIP (WITH PELVIS IF PERFORMED) 3 VIEWS TECHNIQUE: Fluoroscopic spot image(s) were submitted for interpretation post-operatively. COMPARISON:  07/06/2021 FINDINGS: Limited intraoperative fluoroscopic spot images of open reduction internal fixation left hip fracture, with relative alignment maintained. IMPRESSION: Intraoperative fluoroscopic spot images of left hip ORIF. Please refer to the dictated operative report for full details of intraoperative findings and procedure. Electronically Signed   By: Corrie Mckusick D.O.   On: 07/07/2021 13:36   DG Hip Unilat With Pelvis 2-3 Views Left  Result Date: 07/06/2021 CLINICAL DATA:  Fall hip pain EXAM: DG HIP (WITH OR WITHOUT PELVIS) 2-3V LEFT COMPARISON:  None. FINDINGS: SI joints are non widened. Pubic symphysis  and rami appear intact. Acute nondisplaced intertrochanteric fracture on the left. No femoral head dislocation IMPRESSION: Acute left intertrochanteric fracture Electronically Signed   By: Donavan Foil M.D.   On: 07/06/2021 23:31        Scheduled Meds:  aspirin  81 mg Oral BID   docusate sodium  100 mg Oral BID   feeding supplement  237 mL Oral BID BM   ferrous sulfate  325 mg Oral TID PC   multivitamin with minerals  1 tablet Oral Daily   rosuvastatin  5 mg Oral  QHS   Continuous Infusions:  sodium chloride 75 mL/hr at 07/08/21 0535   methocarbamol (ROBAXIN) IV 500 mg (07/08/21 0536)     LOS: 1 day    Time spent: 40 min    Desma Maxim, MD [facility]   If 7PM-7AM, please contact night-coverage [email or web address] Password [email or web address] 07/08/2021, 1:08 PM

## 2021-07-08 NOTE — TOC Transition Note (Addendum)
Transition of Care [facility]) - CM/SW Discharge Note   Patient Details  Name: Rebecca King MRN: [id number] Date of Birth: 26-Dec-1925  Transition of Care [facility]) CM/SW Contact:  Trish Mage, LCSW Phone Number: 07/08/2021, 1:18 PM   Clinical Narrative:  Alerted by MD that patient is stable for d/c.  Butch Penny, hospital liaison, is not in today.  Left message for RN director to try to coordinate patient admission to SNF today.  TOC will continue to follow during the course of hospitalization.  Addendum: Spoke with patient and her husband to update them.  Husband states he has worked hard to try to get confirmation of bed at rehab, is under the impression it may happen tomorrow, but not 100%.  He understands that without usual staff due to holiday weekend communication is not flowing in normal pattern.  FL2 is completed in hopes that patient can return to [place], ir not today, over the weekend.  Addendum: Spoke with Production assistant, radio at [facility] who confirms bed for tomorrow.  Call Erin to confirm d/c tomorrow at [phone].  FAX FL2 and d/c summary to [phone].  Having nursing call report to rehab unit at [phone].       Final next level of care: Skilled Nursing Facility Barriers to Discharge: Other (must enter comment) (facility confirmation of bed)   Patient Goals and CMS Choice        Discharge Placement                       Discharge Plan and Services                                     Social Determinants of Health (SDOH) Interventions     Readmission Risk Interventions No flowsheet data found.

## 2021-07-09 LAB — CBC
HCT: 26.6 % — ABNORMAL LOW (ref 36.0–46.0)
Hemoglobin: 8.9 g/dL — ABNORMAL LOW (ref 12.0–15.0)
MCH: 31.8 pg (ref 26.0–34.0)
MCHC: 33.5 g/dL (ref 30.0–36.0)
MCV: 95 fL (ref 80.0–100.0)
Platelets: 215 10*3/uL (ref 150–400)
RBC: 2.8 MIL/uL — ABNORMAL LOW (ref 3.87–5.11)
RDW: 14.7 % (ref 11.5–15.5)
WBC: 22.5 10*3/uL — ABNORMAL HIGH (ref 4.0–10.5)
nRBC: 0 % (ref 0.0–0.2)

## 2021-07-09 LAB — BASIC METABOLIC PANEL
Anion gap: 7 (ref 5–15)
BUN: 19 mg/dL (ref 8–23)
CO2: 23 mmol/L (ref 22–32)
Calcium: 9.3 mg/dL (ref 8.9–10.3)
Chloride: 106 mmol/L (ref 98–111)
Creatinine, Ser: 0.81 mg/dL (ref 0.44–1.00)
GFR, Estimated: >60 mL/min (ref >60)
Glucose, Bld: 130 mg/dL — ABNORMAL HIGH (ref 70–99)
Potassium: 3.5 mmol/L (ref 3.5–5.1)
Sodium: 136 mmol/L (ref 135–145)

## 2021-07-09 LAB — T3: T3, Total: 123 ng/dL (ref 71–180)

## 2021-07-09 LAB — RESP PANEL BY RT-PCR (FLU A&B, COVID) ARPGX2
Influenza A by PCR: NEGATIVE
Influenza B by PCR: NEGATIVE
SARS Coronavirus 2 by RT PCR: NEGATIVE

## 2021-07-09 MED ORDER — HYDROCODONE-ACETAMINOPHEN 5-325 MG PO TABS: 1.0000 | ORAL_TABLET | ORAL | 0 refills | Status: DC | PRN

## 2021-07-09 MED ORDER — POLYETHYLENE GLYCOL 3350 17 G PO PACK: 17.0000 g | PACK | Freq: Every day | ORAL | 0 refills | Status: DC

## 2021-07-09 MED ORDER — ENOXAPARIN SODIUM 40 MG/0.4ML IJ SOSY
40.0000 mg | PREFILLED_SYRINGE | INTRAMUSCULAR | Status: DC
Start: 2021-07-09 — End: 2022-02-09

## 2021-07-09 MED ORDER — TRAMADOL HCL 50 MG PO TABS: 50.0000 mg | ORAL_TABLET | Freq: Two times a day (BID) | ORAL | 0 refills | Status: DC | PRN

## 2021-07-09 NOTE — Progress Notes (Signed)
Physical Therapy Treatment Patient Details Name: Rebecca King MRN: [id number] DOB: Aug 18, 1925 Today's Date: 07/09/2021   History of Present Illness 87 y.o. female admited with fall, L IT hip fx, s/p IM nail on 07/07/21, PWB. Pt with medical history significant of CLL, osteopenia, GERD, HLD, CKD stage3, diastolic CHF, Aortic aneurysm, CAD.    PT Comments    Pt tolerated session well today. Was a bit sore when I entered, but I feel it was stiff and soreness improved with gentle exercises and mobility. Tolerated sit to stand several times and ambulating in room with RW with adherence to [facility]. Educated with a lot of safety cues, and HEP for transition to [employer].     Recommendations for follow up therapy are one component of a multi-disciplinary discharge planning process, led by the attending physician.  Recommendations may be updated based on patient status, additional functional criteria and insurance authorization.  Follow Up Recommendations  Skilled nursing-short term rehab (<3 hours/day)     Assistance Recommended at Discharge Frequent or constant Supervision/Assistance  Equipment Recommendations  Other (comment) (pt will get these at the next venue, RW)    Recommendations for Other Services       Precautions / Restrictions Precautions Precautions: Fall Restrictions LLE Weight Bearing: Partial weight bearing LLE Partial Weight Bearing Percentage or Pounds: 50% LLE     Mobility  Bed Mobility Overal bed mobility: Needs Assistance Bed Mobility: Supine to Sit     Supine to sit: HOB elevated;Min guard     General bed mobility comments: cues for sequencing to help by using the "better " leg the R LE for scooting hips    Transfers Overall transfer level: Needs assistance Equipment used: Rolling walker (2 wheels) Transfers: Sit to/from Stand Sit to Stand: Supervision           General transfer comment: cues for safety with RW and occassion Min assist with  LOB posteriorly intially. and adherence to [facility].    Ambulation/Gait Ambulation/Gait assistance: Min guard Gait Distance (Feet): 10 Feet Assistive device: Rolling walker (2 wheels) Gait Pattern/deviations: Step-to pattern       General Gait Details: step to pattern for adherence to PWB. Pt tolerated well and cues for RW safety to stay in middle and not to close to teh front to prevent LOB posteriorly.   Stairs             Wheelchair Mobility    Modified Rankin (Stroke Patients Only)       Balance Overall balance assessment: Needs assistance   Sitting balance-Leahy Scale: Good     Standing balance support: Bilateral upper extremity supported;During functional activity Standing balance-Leahy Scale: Fair                              Cognition Arousal/Alertness: Awake/alert Behavior During Therapy: WFL for tasks assessed/performed Overall Cognitive Status: Within Functional Limits for tasks assessed                                          Exercises General Exercises - Lower Extremity Ankle Circles/Pumps: AROM;Both;10 reps;Supine Quad Sets: AROM;Both;10 reps Heel Slides: AAROM;Left;Supine;10 reps Hip ABduction/ADduction: AAROM;Left;10 reps;Supine    General Comments        Pertinent Vitals/Pain Pain Score: 3  Pain Location: L anteriro thigh and distal to L knee area with  ER roation in resting position (educated with towel role along side on LLE to prevent LE from rolling into ER iwth resting.This helped a lot) Pain Descriptors / Indicators: Sore Pain Intervention(s): Relaxation;Repositioned    Home Living                          Prior Function            PT Goals (current goals can now be found in the care plan section) Acute Rehab PT Goals Patient Stated Goal: likes to walk and play word games on her ipad PT Goal Formulation: With patient Time For Goal Achievement: 07/22/21 Potential to Achieve Goals:  Good Progress towards PT goals: Progressing toward goals    Frequency    Min 3X/week      PT Plan Current plan remains appropriate    Co-evaluation              AM-PAC PT "6 Clicks" Mobility   Outcome Measure  Help needed turning from your back to your side while in a flat bed without using bedrails?: A Little Help needed moving from lying on your back to sitting on the side of a flat bed without using bedrails?: A Little Help needed moving to and from a bed to a chair (including a wheelchair)?: A Little Help needed standing up from a chair using your arms (e.g., wheelchair or bedside chair)?: A Little Help needed to walk in hospital room?: A Little Help needed climbing 3-5 steps with a railing? : A Lot 6 Click Score: 17    End of Session Equipment Utilized During Treatment: Gait belt Activity Tolerance: Patient tolerated treatment well Patient left: in chair;with call bell/phone within reach;with chair alarm set;with family/visitor present Nurse Communication: Mobility status PT Visit Diagnosis: Difficulty in walking, not elsewhere classified (R26.2);Pain Pain - Right/Left: Left Pain - part of body: Hip     Time: 1224-8250 PT Time Calculation (min) (ACUTE ONLY): 40 min  Charges:  $Gait Training: 8-22 mins $Therapeutic Exercise: 8-22 mins $Therapeutic Activity: 8-22 mins                     Osualdo Hansell, PT, MPT Acute Rehabilitation Services Office: [phone] Pager: [phone] 07/09/2021    Clide Dales 07/09/2021, 12:32 PM

## 2021-07-09 NOTE — Plan of Care (Signed)
Pt Rebecca King, pleasant and cooperative with periods of confusion and forgetfulness. Reorients easily. Spouse at bedside.  S/P ORIF Left Hip. Dressing intact. Ortho following. PT working with patient.  Plan for SNF on D/C. Pt to return to [facility] rehab unit when medically cleared.  Pain management per orders.   Problem: Clinical Measurements: Goal: Ability to maintain clinical measurements within normal limits will improve Outcome: Progressing  Problem: Activity: Goal: Ability to ambulate and perform ADLs will improve Outcome: Progressing Goal: Risk for activity intolerance will decrease Outcome: Progressing   Problem: Nutrition: Goal: Adequate nutrition will be maintained Outcome: Progressing   Problem: Coping: Goal: Level of anxiety will decrease Outcome: Progressing   Problem: Safety: Goal: Ability to remain free from injury will improve Outcome: Progressing

## 2021-07-09 NOTE — Progress Notes (Signed)
Pt BP 111/50 notified [facility], no new orders.

## 2021-07-09 NOTE — Discharge Summary (Signed)
Rebecca King:[id number] DOB: 05-24-1926 DOA: 07/06/2021  PCP: [name], MD  Admit date: 07/06/2021 Discharge date: 07/09/2021  Time spent: 40 minutes  Recommendations for Outpatient Follow-up:  Orthopedics f/u 2 weeks     Discharge Diagnoses:  Principal Problem:   Closed left hip fracture ([place]) Active Problems:   GERD (gastroesophageal reflux disease)   CAD in native artery   Ascending aortic aneurysm   CLL (chronic lymphocytic leukemia) (HCC)   Stage III chronic kidney disease (HCC)   Hyperlipidemia   Iron deficiency anemia   Chronic diastolic CHF (congestive heart failure) ([place])   Discharge Condition: stable  Diet recommendation: heart healthy  Filed Weights   07/07/21 1355  Weight: 74.3 kg    History of present illness:  Rebecca King is a 87 y.o. female with medical history significant of CLL, osteopenia, GERD, HLD, CKD stage3, diastolic CHF, Aortic aneurysm, CAD Presented with   mechanical fall on left hip while setting the table tripped on a rug her body twisted and she lended on left side Pain unable to get up Given fentanyl  50 mcg At baseline walks with a cane, can walk up to 200 feet with out SOB/CP limited only by bad knees Have not had her nitro for the past 27m  Hospital Course:  # Closed left intertrochanteric hip fracture S/p operative repair with orthopedics on 11/25. Pain well controlled - 50% WBAT - oxy for pain control, bowel regimen ordered - cont lovenox for total 30 days - no sig post-op anemia - PT/OT advising snf, d/c to that - f/u 2 wks Dr. Noralee Chars   # Delirium Mild, improving - delirium precautions, pain control, etc.   # CLL Chronic, is being monitored by oncology - outpt f/u   # CAD Asymptomatic - cont statin, re-started aspirin  Procedures: Open reduction and internal fixation of left intertrochanteric femur fracture utilizing an intramedullary nail 11/24  Consultations: orthopedics  Discharge  Exam: Vitals:   07/08/21 2100 07/09/21 0511  BP: (!) 93/58 (!) 111/50  Pulse: 75 73  Resp: 16 16  Temp: 98.4 F (36.9 C) 97.8 F (36.6 C)  SpO2: 96% 100%    General exam: Appears calm and comfortable  Respiratory system: Clear to auscultation. Respiratory effort normal. Cardiovascular system: S1 & S2 heard, RRR. No JVD, murmurs, rubs, gallops or clicks. No pedal edema. Gastrointestinal system: Abdomen is nondistended, soft and nontender. No organomegaly or masses felt. Normal bowel sounds heard. Central nervous system: Alert and oriented. No focal neurological deficits. Extremities: can move left leg. Warm left foot. LE sensation intact b/l Skin: bandage left thigh.  Psychiatry: Judgement and insight appear normal. Mood & affect appropriate.     Discharge Instructions   Discharge Instructions     Diet - low sodium heart healthy   Complete by: As directed    Increase activity slowly   Complete by: As directed    Leave dressing on - Keep it clean, dry, and intact until clinic visit   Complete by: As directed       Allergies as of 07/09/2021       Reactions   Amoxicillin Other (See Comments)   HEADACHES Other reaction(s): Headache   Macrolides And Ketolides    Other reaction(s): Unknown   Metoprolol    Other reaction(s): hypotension   Ciprofloxacin Nausea Only, Rash   And tingling lips and itching   Macrodantin [nitrofurantoin] Rash   Sulfa Antibiotics Nausea Only, Rash   And tingling lips and itching  Other reaction(s): Unknown        Medication List     TAKE these medications    acetaminophen 650 MG CR tablet Commonly known as: TYLENOL Take 650 mg by mouth 2 (two) times daily.   ALIGN PO Take 1 capsule by mouth as needed.   aspirin 81 MG tablet Take 81 mg by mouth daily.   CO Q 10 PO Take 1 tablet by mouth daily.   enoxaparin 40 MG/0.4ML injection Commonly known as: LOVENOX Inject 0.4 mLs (40 mg total) into the skin daily.    HYDROcodone-acetaminophen 5-325 MG tablet Commonly known as: NORCO/VICODIN Take 1-2 tablets by mouth every 4 (four) hours as needed for moderate pain (pain score 4-6).   multivitamin with minerals Tabs tablet Take 1 tablet by mouth daily.   Nitrostat 0.4 MG SL tablet Generic drug: nitroGLYCERIN DISSOLVE ONE TABLET UNDERTONGUE AS DIRECTED AS    NEEDED FOR CHEST PAIN   polyethylene glycol 17 g packet Commonly known as: MIRALAX / GLYCOLAX Take 17 g by mouth daily. What changed: when to take this   rosuvastatin 5 MG tablet Commonly known as: CRESTOR Take 5 mg by mouth at bedtime.   traMADol 50 MG tablet Commonly known as: ULTRAM Take 1 tablet (50 mg total) by mouth 2 (two) times daily as needed for moderate pain.               Discharge Care Instructions  (From admission, onward)           Start     Ordered   07/09/21 0000  Leave dressing on - Keep it clean, dry, and intact until clinic visit        07/09/21 1148           Allergies  Allergen Reactions   Amoxicillin Other (See Comments)    HEADACHES Other reaction(s): Headache   Macrolides And Ketolides     Other reaction(s): Unknown   Metoprolol     Other reaction(s): hypotension   Ciprofloxacin Nausea Only and Rash    And tingling lips and itching   Macrodantin [Nitrofurantoin] Rash   Sulfa Antibiotics Nausea Only and Rash    And tingling lips and itching Other reaction(s): Unknown    Follow-up Information     Paralee Cancel, MD. Schedule an appointment as soon as possible for a visit in 2 week(s).   Specialty: Orthopedic Surgery Contact information: [address] [place] [address] [place] [address] [phone]                  The results of significant diagnostics from this hospitalization (including imaging, microbiology, ancillary and laboratory) are listed below for reference.    Significant Diagnostic Studies: DG Chest 1 View  Result Date: 07/06/2021 CLINICAL DATA:  Fall  with hip fracture EXAM: CHEST  1 VIEW COMPARISON:  02/26/2020 FINDINGS: No focal opacity or pleural effusion. Stable cardiomediastinal silhouette with aortic atherosclerosis. No pneumothorax. IMPRESSION: No active disease. Electronically Signed   By: Donavan Foil M.D.   On: 07/06/2021 23:32   DG Knee Complete 4 Views Left  Result Date: 07/06/2021 CLINICAL DATA:  Recent fall with knee pain, initial encounter EXAM: LEFT KNEE - COMPLETE 4+ VIEW COMPARISON:  None. FINDINGS: Degenerative changes of the left knee joint are seen. No acute fracture or dislocation is seen. Patella is within normal limits. No joint effusion is noted. IMPRESSION: Tricompartmental degenerative changes without acute abnormality. Electronically Signed   By: Inez Catalina M.D.   On: 07/06/2021  23:41   DG C-Arm 1-60 Min-No Report  Result Date: 07/07/2021 Fluoroscopy was utilized by the requesting physician.  No radiographic interpretation.   ECHOCARDIOGRAM COMPLETE  Result Date: 07/08/2021    ECHOCARDIOGRAM REPORT   Patient Name:   LASHAWNDRA LAMPKINS Date of Exam: 07/08/2021 Medical Rec #:  [id number]      Height:       63.0 in Accession #:    [id number]     Weight:       163.8 lb Date of Birth:  1926-06-02      BSA:          1.776 m Patient Age:    60 years       BP:           144/62 mmHg Patient Gender: F              HR:           92 bpm. Exam Location:  Inpatient Procedure: 2D Echo, Color Doppler and Cardiac Doppler Indications:    Aortic aneurysm [facility]) [[id number]]  History:        Patient has prior history of Echocardiogram examinations, most                 recent 08/02/2017. CHF; Risk Factors:Hypertension and                 Dyslipidemia. Chronic kidney disease, anemia. Left hip fracture.  Sonographer:    Darlina Sicilian RDCS Referring Phys: [place]  1. Left ventricular ejection fraction, by estimation, is 60 to 65%. The left ventricle has normal function. The left ventricle has no regional wall motion  abnormalities. Left ventricular diastolic parameters are consistent with Grade II diastolic dysfunction (pseudonormalization). Elevated left ventricular end-diastolic pressure.  2. Right ventricular systolic function is normal. The right ventricular size is normal. There is normal pulmonary artery systolic pressure.  3. The mitral valve is abnormal. Trivial mitral valve regurgitation. No evidence of mitral stenosis.  4. The aortic valve is tricuspid. There is moderate calcification of the aortic valve. Aortic valve regurgitation is trivial. Mild aortic valve stenosis.  5. Aortic dilatation noted. There is mild dilatation of the ascending aorta, measuring 42 mm.  6. The inferior vena cava is normal in size with greater than 50% respiratory variability, suggesting right atrial pressure of 3 mmHg. FINDINGS  Left Ventricle: Left ventricular ejection fraction, by estimation, is 60 to 65%. The left ventricle has normal function. The left ventricle has no regional wall motion abnormalities. The left ventricular internal cavity size was normal in size. There is  no left ventricular hypertrophy. Left ventricular diastolic parameters are consistent with Grade II diastolic dysfunction (pseudonormalization). Elevated left ventricular end-diastolic pressure. Right Ventricle: The right ventricular size is normal. No increase in right ventricular wall thickness. Right ventricular systolic function is normal. There is normal pulmonary artery systolic pressure. The tricuspid regurgitant velocity is 2.53 m/s, and  with an assumed right atrial pressure of 3 mmHg, the estimated right ventricular systolic pressure is 57.8 mmHg. Left Atrium: Left atrial size was normal in size. Right Atrium: Right atrial size was normal in size. Pericardium: There is no evidence of pericardial effusion. Mitral Valve: The mitral valve is abnormal. There is mild thickening of the mitral valve leaflet(s). There is mild calcification of the mitral valve  leaflet(s). Trivial mitral valve regurgitation. No evidence of mitral valve stenosis. Tricuspid Valve: The tricuspid valve is normal in structure. Tricuspid valve regurgitation is mild .  No evidence of tricuspid stenosis. Aortic Valve: The aortic valve is tricuspid. There is moderate calcification of the aortic valve. Aortic valve regurgitation is trivial. Mild aortic stenosis is present. Aortic valve mean gradient measures 8.5 mmHg. Aortic valve peak gradient measures 17.0 mmHg. Aortic valve area, by VTI measures 1.60 cm. Pulmonic Valve: The pulmonic valve was normal in structure. Pulmonic valve regurgitation is not visualized. No evidence of pulmonic stenosis. Aorta: The aortic root is normal in size and structure and aortic dilatation noted. There is mild dilatation of the ascending aorta, measuring 42 mm. Venous: The inferior vena cava is normal in size with greater than 50% respiratory variability, suggesting right atrial pressure of 3 mmHg. IAS/Shunts: No atrial level shunt detected by color flow Doppler.  LEFT VENTRICLE PLAX 2D LVIDd:         4.10 cm   Diastology LVIDs:         2.20 cm   LV e' medial:    7.54 cm/s LV PW:         1.00 cm   LV E/e' medial:  11.9 LV IVS:        1.00 cm   LV e' lateral:   4.58 cm/s LVOT diam:     2.00 cm   LV E/e' lateral: 19.7 LV SV:         62 LV SV Index:   35 LVOT Area:     3.14 cm  RIGHT VENTRICLE RV S prime:     17.60 cm/s TAPSE (M-mode): 2.2 cm LEFT ATRIUM             Index LA diam:        2.80 cm 1.58 cm/m LA Vol (A2C):   53.5 ml 30.12 ml/m LA Vol (A4C):   53.9 ml 30.34 ml/m LA Biplane Vol: 57.6 ml 32.43 ml/m  AORTIC VALVE AV Area (Vmax):    1.49 cm AV Area (Vmean):   1.43 cm AV Area (VTI):     1.60 cm AV Vmax:           206.00 cm/s AV Vmean:          132.500 cm/s AV VTI:            0.384 m AV Peak Grad:      17.0 mmHg AV Mean Grad:      8.5 mmHg LVOT Vmax:         97.90 cm/s LVOT Vmean:        60.400 cm/s LVOT VTI:          0.196 m LVOT/AV VTI ratio: 0.51  AORTA  Ao Root diam: 3.50 cm Ao STJ diam:  3.1 cm Ao Asc diam:  4.20 cm MITRAL VALVE                TRICUSPID VALVE MV Area (PHT): 4.71 cm     TR Peak grad:   25.6 mmHg MV Decel Time: 161 msec     TR Vmax:        253.00 cm/s MV E velocity: 90.00 cm/s MV A velocity: 105.00 cm/s  SHUNTS MV E/A ratio:  0.86         Systemic VTI:  0.20 m                             Systemic Diam: 2.00 cm Jenkins Rouge MD Electronically signed by Jenkins Rouge MD Signature Date/Time: 07/08/2021/10:18:10 AM    Final  DG HIP OPERATIVE UNILAT W OR W/O PELVIS LEFT  Result Date: 07/07/2021 CLINICAL DATA:  87 year old female with left hip fracture EXAM: OPERATIVE LEFT HIP (WITH PELVIS IF PERFORMED) 3 VIEWS TECHNIQUE: Fluoroscopic spot image(s) were submitted for interpretation post-operatively. COMPARISON:  07/06/2021 FINDINGS: Limited intraoperative fluoroscopic spot images of open reduction internal fixation left hip fracture, with relative alignment maintained. IMPRESSION: Intraoperative fluoroscopic spot images of left hip ORIF. Please refer to the dictated operative report for full details of intraoperative findings and procedure. Electronically Signed   By: Corrie Mckusick D.O.   On: 07/07/2021 13:36   DG Hip Unilat With Pelvis 2-3 Views Left  Result Date: 07/06/2021 CLINICAL DATA:  Fall hip pain EXAM: DG HIP (WITH OR WITHOUT PELVIS) 2-3V LEFT COMPARISON:  None. FINDINGS: SI joints are non widened. Pubic symphysis and rami appear intact. Acute nondisplaced intertrochanteric fracture on the left. No femoral head dislocation IMPRESSION: Acute left intertrochanteric fracture Electronically Signed   By: Donavan Foil M.D.   On: 07/06/2021 23:31    Microbiology: Recent Results (from the past 240 hour(s))  Resp Panel by RT-PCR (Flu A&B, Covid) Nasopharyngeal Swab     Status: None   Collection Time: 07/06/21 11:35 PM   Specimen: Nasopharyngeal Swab; Nasopharyngeal(NP) swabs in vial transport medium  Result Value Ref Range Status    SARS Coronavirus 2 by RT PCR NEGATIVE NEGATIVE Final    Comment: (NOTE) SARS-CoV-2 target nucleic acids are NOT DETECTED.  The SARS-CoV-2 RNA is generally detectable in upper respiratory specimens during the acute phase of infection. The lowest concentration of SARS-CoV-2 viral copies this assay can detect is 138 copies/mL. A negative result does not preclude SARS-Cov-2 infection and should not be used as the sole basis for treatment or other patient management decisions. A negative result may occur with  improper specimen collection/handling, submission of specimen other than nasopharyngeal swab, presence of viral mutation(s) within the areas targeted by this assay, and inadequate number of viral copies(<138 copies/mL). A negative result must be combined with clinical observations, patient history, and epidemiological information. The expected result is Negative.  Fact Sheet for Patients:  EntrepreneurPulse.com.au  Fact Sheet for Healthcare Providers:  IncredibleEmployment.be  This test is no t yet approved or cleared by the [place] FDA and  has been authorized for detection and/or diagnosis of SARS-CoV-2 by FDA under an Emergency Use Authorization (EUA). This EUA will remain  in effect (meaning this test can be used) for the duration of the COVID-19 declaration under Section 564(b)(1) of the Act, 21 U.S.C.section 360bbb-3(b)(1), unless the authorization is terminated  or revoked sooner.       Influenza A by PCR NEGATIVE NEGATIVE Final   Influenza B by PCR NEGATIVE NEGATIVE Final    Comment: (NOTE) The Xpert Xpress SARS-CoV-2/FLU/RSV plus assay is intended as an aid in the diagnosis of influenza from Nasopharyngeal swab specimens and should not be used as a sole basis for treatment. Nasal washings and aspirates are unacceptable for Xpert Xpress SARS-CoV-2/FLU/RSV testing.  Fact Sheet for  Patients: EntrepreneurPulse.com.au  Fact Sheet for Healthcare Providers: IncredibleEmployment.be  This test is not yet approved or cleared by the [place] FDA and has been authorized for detection and/or diagnosis of SARS-CoV-2 by FDA under an Emergency Use Authorization (EUA). This EUA will remain in effect (meaning this test can be used) for the duration of the COVID-19 declaration under Section 564(b)(1) of the Act, 21 U.S.C. section 360bbb-3(b)(1), unless the authorization is terminated or revoked.  Performed at [facility]  [place] [address], [place], [place] [address]      Labs: Basic Metabolic Panel: Recent Labs  Lab 07/06/21 2335 07/07/21 0435 07/08/21 0514 07/09/21 0542  NA 136 137 137 136  K 4.3 4.4 3.9 3.5  CL 105 109 108 106  CO2 25 22 23 23   GLUCOSE 115* 113* 114* 130*  BUN 25* 21 13 19   CREATININE 0.95 0.87 0.74 0.81  CALCIUM 9.4 9.0 9.1 9.3  MG 2.1  --   --   --   PHOS 2.5  --   --   --    Liver Function Tests: Recent Labs  Lab 07/06/21 2335  AST 21  ALT 15  ALKPHOS 61  BILITOT 0.8  PROT 6.3*  ALBUMIN 3.9   No results for input(s): LIPASE, AMYLASE in the last 168 hours. No results for input(s): AMMONIA in the last 168 hours. CBC: Recent Labs  Lab 07/06/21 2335 07/07/21 0435 07/08/21 0514 07/09/21 0542  WBC 18.2* 17.3* 16.9* 22.5*  NEUTROABS 11.3* 11.8*  --   --   HGB 10.9* 10.3* 9.5* 8.9*  HCT 32.6* 31.2* 29.8* 26.6*  MCV 94.5 94.3 97.7 95.0  PLT 260 247 208 215   Cardiac Enzymes: Recent Labs  Lab 07/06/21 2335  CKTOTAL 86   BNP: BNP (last 3 results) No results for input(s): BNP in the last 8760 hours.  ProBNP (last 3 results) No results for input(s): PROBNP in the last 8760 hours.  CBG: No results for input(s): GLUCAP in the last 168 hours.     Signed:  Desma Maxim MD.  [facility] 07/09/2021, 11:49 AM

## 2021-07-11 ENCOUNTER — Encounter: Payer: Self-pay | Admitting: Internal Medicine

## 2021-07-11 ENCOUNTER — Non-Acute Institutional Stay (SKILLED_NURSING_FACILITY): Payer: Medicare Other | Admitting: Internal Medicine

## 2021-07-11 DIAGNOSIS — C9110 Chronic lymphocytic leukemia of B-cell type not having achieved remission: Secondary | ICD-10-CM

## 2021-07-11 DIAGNOSIS — F09 Unspecified mental disorder due to known physiological condition: Secondary | ICD-10-CM | POA: Diagnosis not present

## 2021-07-11 DIAGNOSIS — N1831 Chronic kidney disease, stage 3a: Secondary | ICD-10-CM

## 2021-07-11 DIAGNOSIS — E785 Hyperlipidemia, unspecified: Secondary | ICD-10-CM

## 2021-07-11 DIAGNOSIS — Z9889 Other specified postprocedural states: Secondary | ICD-10-CM

## 2021-07-11 LAB — HOMOCYSTEINE: Homocysteine: 7.7 umol/L (ref 0.0–21.3)

## 2021-07-11 LAB — METHYLMALONIC ACID, SERUM: Methylmalonic Acid, Quantitative: 169 nmol/L (ref 0–378)

## 2021-07-11 NOTE — Progress Notes (Signed)
Provider:  Veleta Miners MD  Location:   [facility] Room Number: 366 Place of Service:  SNF (31)  PCP: [name], MD Patient Care Team: [name], MD as PCP - General (Internal Medicine) Belva Crome, MD as PCP - Cardiology (Cardiology)  Extended Emergency Contact Information Primary Emergency Contact: [name] F Address: [address]          [place], [place] [address] [name] of [place] Phone: [phone] Mobile Phone: [phone] Relation: Spouse  Code Status: DNR Goals of Care: Advanced Directive information Advanced Directives 07/11/2021  Does Patient Have a Medical Advance Directive? No  Type of Advance Directive -  Does patient want to make changes to medical advance directive? -  Copy of [place] in Chart? -  Would patient like information on creating a medical advance directive? No - Patient declined      Chief Complaint  Patient presents with   New Admit To SNF    Admission to SNF    HPI: Patient is a 87 y.o. female seen today for admission to rehab for therapy  Patient was admitted in the hospital from 11/23-11/26 after sustaining left hip fracture s/p IM nail . Patient has a history of CLL, GERD, HLD, stage III CKD, diastolic CHF and CAD  She fell in her apartment while setting the table. Was found to have a left intertrochanteric hip fracture.  Underwent IM nail placement on 84/16SA Postop was complicated by mild  delirium.Also has Anemia Now she is in SNF. Her husband is staying with her due to her anxiety and mild confusion Wants to make sure patient has adequate pain control. She also wants something for constipation  Lives with her husband in [place] with her cane before the fall Mild Cognitive impairment per husband but independent in her ADLS  Past Medical History:  Diagnosis Date   Abnormal nuclear stress test 01/26/2009   POST EXERCISE PVC's  NOTED SINCE 2003   CLL (chronic lymphocytic leukemia) ([place]) 10/2009   DR. KALE   Colon polyp    HYPERPLASTIC   Diverticulosis    DJD (degenerative joint disease)    GERD (gastroesophageal reflux disease)    Hearing loss    DR. SHOEMAKER   Hypercholesterolemia    Hyperlipidemia    Iron deficiency anemia 05/2010   Lesion of breast    LEFT, SMALL   Microscopic hematuria    DR. PETERSON   Neuropathy    PERIPHERAL   Osteopenia    Renal cyst 10/2009   RENAL CYST ON CT 11/2009   Renal disease    STAGE 3   Tendon cysts    EXTENSOR 2MM RIGHT FIRST FINGER   Vitamin D deficiency 10/2011   Past Surgical History:  Procedure Laterality Date   INTRAMEDULLARY (IM) NAIL INTERTROCHANTERIC Left 07/07/2021   Procedure: INTRAMEDULLARY (IM) NAIL INTERTROCHANTRIC;  Surgeon: Paralee Cancel, MD;  Location: [facility];  Service: Orthopedics;  Laterality: Left;   LEFT HEART CATHETERIZATION WITH CORONARY ANGIOGRAM N/A 12/25/2011   Procedure: LEFT HEART CATHETERIZATION WITH CORONARY ANGIOGRAM;  Surgeon: Sinclair Grooms, MD;  Location: [facility] CATH LAB;  Service: Cardiovascular;  Laterality: N/A;    reports that she quit smoking about 41 years ago. Her smoking use included cigarettes. She has never used smokeless tobacco. She reports current alcohol use. She reports that she does not use drugs. Social History   Socioeconomic History   Marital status: Married    Spouse name:  Not on file   Number of children: Not on file   Years of education: Not on file   Highest education level: Not on file  Occupational History   Occupation: UNEMPLOYED  Tobacco Use   Smoking status: Former    Types: Cigarettes    Quit date: 06/25/1980    Years since quitting: 41.0   Smokeless tobacco: Never  Vaping Use   Vaping Use: Never used  Substance and Sexual Activity   Alcohol use: Yes   Drug use: No   Sexual activity: Not on file  Other Topics Concern   Not on file  Social History Narrative   Not on file   Social  Determinants of Health   Financial Resource Strain: Not on file  Food Insecurity: Not on file  Transportation Needs: Not on file  Physical Activity: Not on file  Stress: Not on file  Social Connections: Not on file  Intimate Partner Violence: Not on file    Functional Status Survey:    Family History  Family history unknown: Yes    Health Maintenance  Topic Date Due   Zoster Vaccines- Shingrix (1 of 2) Never done   DEXA SCAN  Never done   COVID-19 Vaccine (4 - Booster) 06/24/2020   INFLUENZA VACCINE  03/14/2021   TETANUS/TDAP  05/06/2025   Pneumonia Vaccine 67+ Years old  Completed   HPV VACCINES  Aged Out    Allergies  Allergen Reactions   Amoxicillin Other (See Comments)    HEADACHES Other reaction(s): Headache   Macrolides And Ketolides     Other reaction(s): Unknown   Metoprolol     Other reaction(s): hypotension   Ciprofloxacin Nausea Only and Rash    And tingling lips and itching   Macrodantin [Nitrofurantoin] Rash   Sulfa Antibiotics Nausea Only and Rash    And tingling lips and itching Other reaction(s): Unknown    Allergies as of 07/11/2021       Reactions   Amoxicillin Other (See Comments)   HEADACHES Other reaction(s): Headache   Macrolides And Ketolides    Other reaction(s): Unknown   Metoprolol    Other reaction(s): hypotension   Ciprofloxacin Nausea Only, Rash   And tingling lips and itching   Macrodantin [nitrofurantoin] Rash   Sulfa Antibiotics Nausea Only, Rash   And tingling lips and itching Other reaction(s): Unknown        Medication List        Accurate as of July 11, 2021  9:03 AM. If you have any questions, ask your nurse or doctor.          acetaminophen 650 MG CR tablet Commonly known as: TYLENOL Take 650 mg by mouth 2 (two) times daily.   ALIGN PO Take 1 capsule by mouth as needed.   aspirin 81 MG tablet Take 81 mg by mouth daily.   CO Q 10 PO Take 1 tablet by mouth daily.   enoxaparin 40 MG/0.4ML  injection Commonly known as: LOVENOX Inject 0.4 mLs (40 mg total) into the skin daily.   HYDROcodone-acetaminophen 5-325 MG tablet Commonly known as: NORCO/VICODIN Take 1-2 tablets by mouth every 4 (four) hours as needed for moderate pain (pain score 4-6).   multivitamin with minerals Tabs tablet Take 1 tablet by mouth daily.   Nitrostat 0.4 MG SL tablet Generic drug: nitroGLYCERIN DISSOLVE ONE TABLET UNDERTONGUE AS DIRECTED AS    NEEDED FOR CHEST PAIN   polyethylene glycol 17 g packet Commonly known as: MIRALAX / GLYCOLAX Take  17 g by mouth daily.   rosuvastatin 5 MG tablet Commonly known as: CRESTOR Take 5 mg by mouth at bedtime.   traMADol 50 MG tablet Commonly known as: ULTRAM Take 1 tablet (50 mg total) by mouth 2 (two) times daily as needed for moderate pain.        Review of Systems  Constitutional:  Positive for activity change.  HENT: Negative.    Respiratory:  Positive for cough.   Cardiovascular: Negative.   Gastrointestinal:  Positive for constipation.  Genitourinary: Negative.   Musculoskeletal:  Positive for arthralgias, gait problem and myalgias.  Skin:  Positive for wound.  Neurological:  Positive for weakness.  Psychiatric/Behavioral:  Positive for confusion.    Vitals:   07/11/21 0847  BP: 139/80  Pulse: 72  Resp: 20  Temp: (!) 97.2 F (36.2 C)  SpO2: 98%  Weight: 163 lb 12.8 oz (74.3 kg)  Height: 5' 4.3" (1.633 m)   Body mass index is 27.85 kg/m. Physical Exam Constitutional:  Well-developed and well-nourished.  HENT:  Head: Normocephalic.  Mouth/Throat: Oropharynx is clear and moist.  Eyes: Pupils are equal, round, and reactive to light.  Neck: Neck supple.  Cardiovascular: Normal rate and normal heart sounds.  No murmur heard. Pulmonary/Chest: Effort normal and breath sounds normal. No respiratory distress. No wheezes. She has no rales.  Abdominal: Soft. Bowel sounds are normal. No distension. There is no tenderness. There is no  rebound.  Musculoskeletal: No edema.  Lymphadenopathy: none Neurological: Alert No Focal deficits  Skin: Skin is warm and dry.  Psychiatric: Normal mood and affect. Behavior is normal. Thought content normal.   Labs reviewed: Basic Metabolic Panel: Recent Labs    07/06/21 2335 07/07/21 0435 07/08/21 0514 07/09/21 0542  NA 136 137 137 136  K 4.3 4.4 3.9 3.5  CL 105 109 108 106  CO2 25 22 23 23   GLUCOSE 115* 113* 114* 130*  BUN 25* 21 13 19   CREATININE 0.95 0.87 0.74 0.81  CALCIUM 9.4 9.0 9.1 9.3  MG 2.1  --   --   --   PHOS 2.5  --   --   --    Liver Function Tests: Recent Labs    10/19/20 1436 04/20/21 1401 07/06/21 2335  AST 21 17 21   ALT 15 10 15   ALKPHOS 77 72 61  BILITOT 0.5 0.5 0.8  PROT 6.8 6.8 6.3*  ALBUMIN 4.2 4.1 3.9   No results for input(s): LIPASE, AMYLASE in the last 8760 hours. No results for input(s): AMMONIA in the last 8760 hours. CBC: Recent Labs    04/20/21 1401 07/06/21 2335 07/07/21 0435 07/08/21 0514 07/09/21 0542  WBC 17.2* 18.2* 17.3* 16.9* 22.5*  NEUTROABS 4.1 11.3* 11.8*  --   --   HGB 11.6* 10.9* 10.3* 9.5* 8.9*  HCT 34.3* 32.6* 31.2* 29.8* 26.6*  MCV 92.2 94.5 94.3 97.7 95.0  PLT 297 260 247 208 215   Cardiac Enzymes: Recent Labs    07/06/21 2335  CKTOTAL 86   BNP: Invalid input(s): POCBNP No results found for: HGBA1C Lab Results  Component Value Date   TSH 4.845 (H) 07/07/2021   Lab Results  Component Value Date   VITAMINB12 214 07/07/2021   Lab Results  Component Value Date   FOLATE 21.1 07/07/2021   Lab Results  Component Value Date   IRON 37 07/07/2021   TIBC 283 07/07/2021   FERRITIN 52 07/07/2021    Imaging and Procedures obtained prior to SNF admission: DG  Chest 1 View  Result Date: 07/06/2021 CLINICAL DATA:  Fall with hip fracture EXAM: CHEST  1 VIEW COMPARISON:  02/26/2020 FINDINGS: No focal opacity or pleural effusion. Stable cardiomediastinal silhouette with aortic atherosclerosis. No  pneumothorax. IMPRESSION: No active disease. Electronically Signed   By: Donavan Foil M.D.   On: 07/06/2021 23:32   DG Knee Complete 4 Views Left  Result Date: 07/06/2021 CLINICAL DATA:  Recent fall with knee pain, initial encounter EXAM: LEFT KNEE - COMPLETE 4+ VIEW COMPARISON:  None. FINDINGS: Degenerative changes of the left knee joint are seen. No acute fracture or dislocation is seen. Patella is within normal limits. No joint effusion is noted. IMPRESSION: Tricompartmental degenerative changes without acute abnormality. Electronically Signed   By: Inez Catalina M.D.   On: 07/06/2021 23:41   DG C-Arm 1-60 Min-No Report  Result Date: 07/07/2021 CLINICAL DATA:  87 year old female with left hip fracture EXAM: OPERATIVE LEFT HIP (WITH PELVIS IF PERFORMED) 3 VIEWS TECHNIQUE: Fluoroscopic spot image(s) were submitted for interpretation post-operatively. COMPARISON:  07/06/2021 FINDINGS: Limited intraoperative fluoroscopic spot images of open reduction internal fixation left hip fracture, with relative alignment maintained. IMPRESSION: Intraoperative fluoroscopic spot images of left hip ORIF. Please refer to the dictated operative report for full details of intraoperative findings and procedure. Electronically Signed   By: Corrie Mckusick D.O.   On: 07/07/2021 13:36   DG HIP OPERATIVE UNILAT W OR W/O PELVIS LEFT  Result Date: 07/07/2021 CLINICAL DATA:  87 year old female with left hip fracture EXAM: OPERATIVE LEFT HIP (WITH PELVIS IF PERFORMED) 3 VIEWS TECHNIQUE: Fluoroscopic spot image(s) were submitted for interpretation post-operatively. COMPARISON:  07/06/2021 FINDINGS: Limited intraoperative fluoroscopic spot images of open reduction internal fixation left hip fracture, with relative alignment maintained. IMPRESSION: Intraoperative fluoroscopic spot images of left hip ORIF. Please refer to the dictated operative report for full details of intraoperative findings and procedure. Electronically Signed    By: Corrie Mckusick D.O.   On: 07/07/2021 13:36   DG Hip Unilat With Pelvis 2-3 Views Left  Result Date: 07/06/2021 CLINICAL DATA:  Fall hip pain EXAM: DG HIP (WITH OR WITHOUT PELVIS) 2-3V LEFT COMPARISON:  None. FINDINGS: SI joints are non widened. Pubic symphysis and rami appear intact. Acute nondisplaced intertrochanteric fracture on the left. No femoral head dislocation IMPRESSION: Acute left intertrochanteric fracture Electronically Signed   By: Donavan Foil M.D.   On: 07/06/2021 23:31    Assessment/Plan CLL (chronic lymphocytic leukemia) ([place]) Follows with Oncology Not on any  meds  Status post hip surgery WBAT 50 % Schedule Norco BID and uses PRN in between Also start on Robaxin 250 mg Q4 PRN Start working with therapy Anemia Repeat CBC Hold Iron right now as she is having issues with her Bowels Constipation Restart oN Miralx and Prune juice  Hyperlipidemia, unspecified hyperlipidemia type On crestor follow with her PCP Mild cognitive disorder Continue supportive care Stage 3a chronic kidney disease ([place]) Follow with BMP   Family/ staff Communication: Husband in room  Labs/tests ordered: CBC,CMP in 1 week

## 2021-07-12 DIAGNOSIS — M25552 Pain in left hip: Secondary | ICD-10-CM | POA: Diagnosis not present

## 2021-07-12 DIAGNOSIS — R2689 Other abnormalities of gait and mobility: Secondary | ICD-10-CM | POA: Diagnosis not present

## 2021-07-12 DIAGNOSIS — R278 Other lack of coordination: Secondary | ICD-10-CM | POA: Diagnosis not present

## 2021-07-12 DIAGNOSIS — Z4789 Encounter for other orthopedic aftercare: Secondary | ICD-10-CM | POA: Diagnosis not present

## 2021-07-12 DIAGNOSIS — Z9181 History of falling: Secondary | ICD-10-CM | POA: Diagnosis not present

## 2021-07-12 DIAGNOSIS — M62552 Muscle wasting and atrophy, not elsewhere classified, left thigh: Secondary | ICD-10-CM | POA: Diagnosis not present

## 2021-07-12 DIAGNOSIS — S72142S Displaced intertrochanteric fracture of left femur, sequela: Secondary | ICD-10-CM | POA: Diagnosis not present

## 2021-07-13 DIAGNOSIS — M62552 Muscle wasting and atrophy, not elsewhere classified, left thigh: Secondary | ICD-10-CM | POA: Diagnosis not present

## 2021-07-13 DIAGNOSIS — S72142S Displaced intertrochanteric fracture of left femur, sequela: Secondary | ICD-10-CM | POA: Diagnosis not present

## 2021-07-13 DIAGNOSIS — R278 Other lack of coordination: Secondary | ICD-10-CM | POA: Diagnosis not present

## 2021-07-13 DIAGNOSIS — R2689 Other abnormalities of gait and mobility: Secondary | ICD-10-CM | POA: Diagnosis not present

## 2021-07-13 DIAGNOSIS — M6389 Disorders of muscle in diseases classified elsewhere, multiple sites: Secondary | ICD-10-CM | POA: Diagnosis not present

## 2021-07-13 DIAGNOSIS — Z4789 Encounter for other orthopedic aftercare: Secondary | ICD-10-CM | POA: Diagnosis not present

## 2021-07-13 DIAGNOSIS — Z9181 History of falling: Secondary | ICD-10-CM | POA: Diagnosis not present

## 2021-07-13 DIAGNOSIS — M25552 Pain in left hip: Secondary | ICD-10-CM | POA: Diagnosis not present

## 2021-07-14 ENCOUNTER — Non-Acute Institutional Stay (SKILLED_NURSING_FACILITY): Payer: Medicare Other | Admitting: Adult Health

## 2021-07-14 ENCOUNTER — Encounter: Payer: Self-pay | Admitting: Adult Health

## 2021-07-14 DIAGNOSIS — Z4789 Encounter for other orthopedic aftercare: Secondary | ICD-10-CM | POA: Diagnosis not present

## 2021-07-14 DIAGNOSIS — F064 Anxiety disorder due to known physiological condition: Secondary | ICD-10-CM

## 2021-07-14 DIAGNOSIS — M6389 Disorders of muscle in diseases classified elsewhere, multiple sites: Secondary | ICD-10-CM | POA: Diagnosis not present

## 2021-07-14 DIAGNOSIS — Z9181 History of falling: Secondary | ICD-10-CM | POA: Diagnosis not present

## 2021-07-14 DIAGNOSIS — R2689 Other abnormalities of gait and mobility: Secondary | ICD-10-CM | POA: Diagnosis not present

## 2021-07-14 DIAGNOSIS — S72142S Displaced intertrochanteric fracture of left femur, sequela: Secondary | ICD-10-CM | POA: Diagnosis not present

## 2021-07-14 DIAGNOSIS — M62552 Muscle wasting and atrophy, not elsewhere classified, left thigh: Secondary | ICD-10-CM | POA: Diagnosis not present

## 2021-07-14 DIAGNOSIS — M25552 Pain in left hip: Secondary | ICD-10-CM | POA: Diagnosis not present

## 2021-07-14 DIAGNOSIS — R278 Other lack of coordination: Secondary | ICD-10-CM | POA: Diagnosis not present

## 2021-07-14 MED ORDER — ALPRAZOLAM 0.5 MG PO TABS: 0.2500 mg | ORAL_TABLET | Freq: Every day | ORAL | 0 refills | Status: DC | PRN

## 2021-07-14 NOTE — Progress Notes (Signed)
Location:   [place] Room Number: 660 Place of Service:  SNF ([phone]) Provider:  Royal Hawthorn, NP  Rebecca Manes, MD  Patient Care Team: Rebecca Manes, MD as PCP - General (Internal Medicine) Rebecca Crome, MD as PCP - Cardiology (Cardiology)  Extended Emergency Contact Information Primary Emergency Contact: Rebecca King F Address: [address]          [place], [place] [address] Rebecca King of [place] Phone: [phone] Mobile Phone: [phone] Relation: Spouse  Code Status:  DNR Goals of care: Advanced Directive information Advanced Directives 07/14/2021  Does Patient Have a Medical Advance Directive? No  Type of Advance Directive -  Does patient want to make changes to medical advance directive? -  Copy of [place] in Chart? -  Would patient like information on creating a medical advance directive? No - Patient declined     Chief Complaint  Patient presents with   Acute Visit    Anxiety    HPI:  Pt is a 87 y.o. female seen today for an acute visit for anxiety. She currently resides in skilled rehab after a fall with closed left hip fracture. She is s/p IM nailing 07/07/21. She is 50% WB and feels she is progressing slowly. She asked to see me due to feelings of anxiety and worry. Her husband is sleeping in the room with her and she feels her feelings are burden to him as well. She feels anxious and can calm down. She is eating well and reports she is sleeping better in the chair. Vitals are stable. Pain is controlled with  norco. Continues to work with therapy. Has mild MCI but very alert and able to communicate her needs.    Past Medical History:  Diagnosis Date   Abnormal nuclear stress test 01/26/2009   POST EXERCISE PVC's NOTED SINCE 2003   CLL (chronic lymphocytic leukemia) ([place]) 10/2009   DR. KALE   Colon polyp    HYPERPLASTIC   Diverticulosis    DJD (degenerative joint disease)    GERD  (gastroesophageal reflux disease)    Hearing loss    DR. SHOEMAKER   Hypercholesterolemia    Hyperlipidemia    Iron deficiency anemia 05/2010   Lesion of breast    LEFT, SMALL   Microscopic hematuria    DR. PETERSON   Neuropathy    PERIPHERAL   Osteopenia    Renal cyst 10/2009   RENAL CYST ON CT 11/2009   Renal disease    STAGE 3   Tendon cysts    EXTENSOR 2MM RIGHT FIRST FINGER   Vitamin D deficiency 10/2011   Past Surgical History:  Procedure Laterality Date   INTRAMEDULLARY (IM) NAIL INTERTROCHANTERIC Left 07/07/2021   Procedure: INTRAMEDULLARY (IM) NAIL INTERTROCHANTRIC;  Surgeon: Paralee Cancel, MD;  Location: [facility];  Service: Orthopedics;  Laterality: Left;   LEFT HEART CATHETERIZATION WITH CORONARY ANGIOGRAM N/A 12/25/2011   Procedure: LEFT HEART CATHETERIZATION WITH CORONARY ANGIOGRAM;  Surgeon: Sinclair Grooms, MD;  Location: [facility] CATH LAB;  Service: Cardiovascular;  Laterality: N/A;    Allergies  Allergen Reactions   Amoxicillin Other (See Comments)    HEADACHES Other reaction(s): Headache   Macrolides And Ketolides     Other reaction(s): Unknown   Metoprolol     Other reaction(s): hypotension   Ciprofloxacin Nausea Only and Rash    And tingling lips and itching   Macrodantin [Nitrofurantoin] Rash   Sulfa Antibiotics Nausea Only and Rash  And tingling lips and itching Other reaction(s): Unknown    Allergies as of 07/14/2021       Reactions   Amoxicillin Other (See Comments)   HEADACHES Other reaction(s): Headache   Macrolides And Ketolides    Other reaction(s): Unknown   Metoprolol    Other reaction(s): hypotension   Ciprofloxacin Nausea Only, Rash   And tingling lips and itching   Macrodantin [nitrofurantoin] Rash   Sulfa Antibiotics Nausea Only, Rash   And tingling lips and itching Other reaction(s): Unknown        Medication List        Accurate as of July 14, 2021 12:12 PM. If you have any questions, ask your nurse or doctor.           acetaminophen 650 MG CR tablet Commonly known as: TYLENOL Take 650 mg by mouth 2 (two) times daily.   ALIGN PO Take 1 capsule by mouth as needed.   aspirin 81 MG tablet Take 81 mg by mouth daily.   CO Q 10 PO Take 1 tablet by mouth daily.   enoxaparin 40 MG/0.4ML injection Commonly known as: LOVENOX Inject 0.4 mLs (40 mg total) into the skin daily.   HYDROcodone-acetaminophen 5-325 MG tablet Commonly known as: NORCO/VICODIN Take 1-2 tablets by mouth every 4 (four) hours as needed for moderate pain (pain score 4-6).   methocarbamol 500 MG tablet Commonly known as: ROBAXIN Take 250 mg by mouth every 4 (four) hours as needed for muscle spasms.   multivitamin with minerals Tabs tablet Take 1 tablet by mouth daily.   Nitrostat 0.4 MG SL tablet Generic drug: nitroGLYCERIN DISSOLVE ONE TABLET UNDERTONGUE AS DIRECTED AS    NEEDED FOR CHEST PAIN   polyethylene glycol 17 g packet Commonly known as: MIRALAX / GLYCOLAX Take 17 g by mouth daily.   rosuvastatin 5 MG tablet Commonly known as: CRESTOR Take 5 mg by mouth at bedtime.   traMADol 50 MG tablet Commonly known as: ULTRAM Take 1 tablet (50 mg total) by mouth 2 (two) times daily as needed for moderate pain.   ULTRA FREEDA PO Take 1 tablet by mouth every morning.        Review of Systems  Constitutional:  Positive for activity change. Negative for appetite change, chills, diaphoresis, fatigue, fever and unexpected weight change.  HENT:  Negative for congestion.   Respiratory:  Negative for cough and shortness of breath.   Cardiovascular:  Positive for leg swelling. Negative for chest pain and palpitations.  Gastrointestinal:  Negative for abdominal distention, abdominal pain, constipation and diarrhea.  Musculoskeletal:  Positive for arthralgias and gait problem.  Psychiatric/Behavioral:  Positive for dysphoric mood. Negative for agitation, behavioral problems, confusion, self-injury, sleep disturbance and  suicidal ideas. The patient is nervous/anxious.    Immunization History  Administered Date(s) Administered   Influenza Split 07/05/2011, 05/30/2012, 05/28/2013, 06/09/2015, 06/16/2016   Influenza, High Dose Seasonal PF 05/04/2014, 05/04/2014, 05/28/2017, 04/30/2018, 06/22/2020   Influenza-Unspecified 05/14/2014   Moderna Sars-Covid-2 Vaccination 08/27/2019, 09/24/2019   PFIZER(Purple Top)SARS-COV-2 Vaccination 04/29/2020   Pneumococcal Conjugate-13 10/28/2013   Pneumococcal Polysaccharide-23 05/23/2005, 12/28/2017   Td 04/04/2005, 04/04/2005   Tdap 05/07/2015   Zoster, Live 11/27/2011   Pertinent  Health Maintenance Due  Topic Date Due   DEXA SCAN  Never done   INFLUENZA VACCINE  03/14/2021   Fall Risk 07/07/2021 07/07/2021 07/08/2021 07/08/2021 07/09/2021  Patient Fall Risk Level High fall risk High fall risk High fall risk High fall risk High fall risk  Functional Status Survey:    Vitals:   07/14/21 1205  BP: 124/70  Pulse: 86  Resp: 20  Temp: 98.7 F (37.1 C)  SpO2: 97%  Weight: 155 lb 6.4 oz (70.5 kg)  Height: 5' 4.3" (1.633 m)   Body mass index is 26.43 kg/m. Physical Exam Vitals and nursing note reviewed.  Constitutional:      Appearance: Normal appearance.  Neurological:     Mental Status: She is alert and oriented to person, place, and time. Mental status is at baseline.  Psychiatric:     Comments: anxious    Labs reviewed: Recent Labs    07/06/21 2335 07/07/21 0435 07/08/21 0514 07/09/21 0542  NA 136 137 137 136  K 4.3 4.4 3.9 3.5  CL 105 109 108 106  CO2 25 22 23 23   GLUCOSE 115* 113* 114* 130*  BUN 25* 21 13 19   CREATININE 0.95 0.87 0.74 0.81  CALCIUM 9.4 9.0 9.1 9.3  MG 2.1  --   --   --   PHOS 2.5  --   --   --    Recent Labs    10/19/20 1436 04/20/21 1401 07/06/21 2335  AST 21 17 21   ALT 15 10 15   ALKPHOS 77 72 61  BILITOT 0.5 0.5 0.8  PROT 6.8 6.8 6.3*  ALBUMIN 4.2 4.1 3.9   Recent Labs    04/20/21 1401 07/06/21 2335  07/07/21 0435 07/08/21 0514 07/09/21 0542  WBC 17.2* 18.2* 17.3* 16.9* 22.5*  NEUTROABS 4.1 11.3* 11.8*  --   --   HGB 11.6* 10.9* 10.3* 9.5* 8.9*  HCT 34.3* 32.6* 31.2* 29.8* 26.6*  MCV 92.2 94.5 94.3 97.7 95.0  PLT 297 260 247 208 215   Lab Results  Component Value Date   TSH 4.845 (H) 07/07/2021   No results found for: HGBA1C No results found for: CHOL, HDL, LDLCALC, LDLDIRECT, TRIG, CHOLHDL  Significant Diagnostic Results in last 30 days:  DG Chest 1 View  Result Date: 07/06/2021 CLINICAL DATA:  Fall with hip fracture EXAM: CHEST  1 VIEW COMPARISON:  02/26/2020 FINDINGS: No focal opacity or pleural effusion. Stable cardiomediastinal silhouette with aortic atherosclerosis. No pneumothorax. IMPRESSION: No active disease. Electronically Signed   By: Donavan Foil M.D.   On: 07/06/2021 23:32   DG Knee Complete 4 Views Left  Result Date: 07/06/2021 CLINICAL DATA:  Recent fall with knee pain, initial encounter EXAM: LEFT KNEE - COMPLETE 4+ VIEW COMPARISON:  None. FINDINGS: Degenerative changes of the left knee joint are seen. No acute fracture or dislocation is seen. Patella is within normal limits. No joint effusion is noted. IMPRESSION: Tricompartmental degenerative changes without acute abnormality. Electronically Signed   By: Inez Catalina M.D.   On: 07/06/2021 23:41   DG C-Arm 1-60 Min-No Report  Result Date: 07/07/2021 CLINICAL DATA:  87 year old female with left hip fracture EXAM: OPERATIVE LEFT HIP (WITH PELVIS IF PERFORMED) 3 VIEWS TECHNIQUE: Fluoroscopic spot image(s) were submitted for interpretation post-operatively. COMPARISON:  07/06/2021 FINDINGS: Limited intraoperative fluoroscopic spot images of open reduction internal fixation left hip fracture, with relative alignment maintained. IMPRESSION: Intraoperative fluoroscopic spot images of left hip ORIF. Please refer to the dictated operative report for full details of intraoperative findings and procedure. Electronically  Signed   By: Corrie Mckusick D.O.   On: 07/07/2021 13:36   ECHOCARDIOGRAM COMPLETE  Result Date: 07/08/2021    ECHOCARDIOGRAM REPORT   Patient Name:   Rebecca King Date of Exam: 07/08/2021 Medical Rec #:  [id number]      Height:       63.0 in Accession #:    [id number]     Weight:       163.8 lb Date of Birth:  Feb 04, 1926      BSA:          1.776 m Patient Age:    57 years       BP:           144/62 mmHg Patient Gender: F              HR:           92 bpm. Exam Location:  Inpatient Procedure: 2D Echo, Color Doppler and Cardiac Doppler Indications:    Aortic aneurysm [facility]) [[id number]]  History:        Patient has prior history of Echocardiogram examinations, most                 recent 08/02/2017. CHF; Risk Factors:Hypertension and                 Dyslipidemia. Chronic kidney disease, anemia. Left hip fracture.  Sonographer:    Darlina Sicilian RDCS Referring Phys: [place]  1. Left ventricular ejection fraction, by estimation, is 60 to 65%. The left ventricle has normal function. The left ventricle has no regional wall motion abnormalities. Left ventricular diastolic parameters are consistent with Grade II diastolic dysfunction (pseudonormalization). Elevated left ventricular end-diastolic pressure.  2. Right ventricular systolic function is normal. The right ventricular size is normal. There is normal pulmonary artery systolic pressure.  3. The mitral valve is abnormal. Trivial mitral valve regurgitation. No evidence of mitral stenosis.  4. The aortic valve is tricuspid. There is moderate calcification of the aortic valve. Aortic valve regurgitation is trivial. Mild aortic valve stenosis.  5. Aortic dilatation noted. There is mild dilatation of the ascending aorta, measuring 42 mm.  6. The inferior vena cava is normal in size with greater than 50% respiratory variability, suggesting right atrial pressure of 3 mmHg. FINDINGS  Left Ventricle: Left ventricular ejection fraction, by  estimation, is 60 to 65%. The left ventricle has normal function. The left ventricle has no regional wall motion abnormalities. The left ventricular internal cavity size was normal in size. There is  no left ventricular hypertrophy. Left ventricular diastolic parameters are consistent with Grade II diastolic dysfunction (pseudonormalization). Elevated left ventricular end-diastolic pressure. Right Ventricle: The right ventricular size is normal. No increase in right ventricular wall thickness. Right ventricular systolic function is normal. There is normal pulmonary artery systolic pressure. The tricuspid regurgitant velocity is 2.53 m/s, and  with an assumed right atrial pressure of 3 mmHg, the estimated right ventricular systolic pressure is 22.2 mmHg. Left Atrium: Left atrial size was normal in size. Right Atrium: Right atrial size was normal in size. Pericardium: There is no evidence of pericardial effusion. Mitral Valve: The mitral valve is abnormal. There is mild thickening of the mitral valve leaflet(s). There is mild calcification of the mitral valve leaflet(s). Trivial mitral valve regurgitation. No evidence of mitral valve stenosis. Tricuspid Valve: The tricuspid valve is normal in structure. Tricuspid valve regurgitation is mild . No evidence of tricuspid stenosis. Aortic Valve: The aortic valve is tricuspid. There is moderate calcification of the aortic valve. Aortic valve regurgitation is trivial. Mild aortic stenosis is present. Aortic valve mean gradient measures 8.5 mmHg. Aortic valve peak gradient measures 17.0 mmHg. Aortic valve area, by VTI measures 1.60 cm. Pulmonic  Valve: The pulmonic valve was normal in structure. Pulmonic valve regurgitation is not visualized. No evidence of pulmonic stenosis. Aorta: The aortic root is normal in size and structure and aortic dilatation noted. There is mild dilatation of the ascending aorta, measuring 42 mm. Venous: The inferior vena cava is normal in size with  greater than 50% respiratory variability, suggesting right atrial pressure of 3 mmHg. IAS/Shunts: No atrial level shunt detected by color flow Doppler.  LEFT VENTRICLE PLAX 2D LVIDd:         4.10 cm   Diastology LVIDs:         2.20 cm   LV e' medial:    7.54 cm/s LV PW:         1.00 cm   LV E/e' medial:  11.9 LV IVS:        1.00 cm   LV e' lateral:   4.58 cm/s LVOT diam:     2.00 cm   LV E/e' lateral: 19.7 LV SV:         62 LV SV Index:   35 LVOT Area:     3.14 cm  RIGHT VENTRICLE RV S prime:     17.60 cm/s TAPSE (M-mode): 2.2 cm LEFT ATRIUM             Index LA diam:        2.80 cm 1.58 cm/m LA Vol (A2C):   53.5 ml 30.12 ml/m LA Vol (A4C):   53.9 ml 30.34 ml/m LA Biplane Vol: 57.6 ml 32.43 ml/m  AORTIC VALVE AV Area (Vmax):    1.49 cm AV Area (Vmean):   1.43 cm AV Area (VTI):     1.60 cm AV Vmax:           206.00 cm/s AV Vmean:          132.500 cm/s AV VTI:            0.384 m AV Peak Grad:      17.0 mmHg AV Mean Grad:      8.5 mmHg LVOT Vmax:         97.90 cm/s LVOT Vmean:        60.400 cm/s LVOT VTI:          0.196 m LVOT/AV VTI ratio: 0.51  AORTA Ao Root diam: 3.50 cm Ao STJ diam:  3.1 cm Ao Asc diam:  4.20 cm MITRAL VALVE                TRICUSPID VALVE MV Area (PHT): 4.71 cm     TR Peak grad:   25.6 mmHg MV Decel Time: 161 msec     TR Vmax:        253.00 cm/s MV E velocity: 90.00 cm/s MV A velocity: 105.00 cm/s  SHUNTS MV E/A ratio:  0.86         Systemic VTI:  0.20 m                             Systemic Diam: 2.00 cm Jenkins Rouge MD Electronically signed by Jenkins Rouge MD Signature Date/Time: 07/08/2021/10:18:10 AM    Final    DG HIP OPERATIVE UNILAT W OR W/O PELVIS LEFT  Result Date: 07/07/2021 CLINICAL DATA:  87 year old female with left hip fracture EXAM: OPERATIVE LEFT HIP (WITH PELVIS IF PERFORMED) 3 VIEWS TECHNIQUE: Fluoroscopic spot image(s) were submitted for interpretation post-operatively. COMPARISON:  07/06/2021 FINDINGS: Limited intraoperative fluoroscopic spot images of  open  reduction internal fixation left hip fracture, with relative alignment maintained. IMPRESSION: Intraoperative fluoroscopic spot images of left hip ORIF. Please refer to the dictated operative report for full details of intraoperative findings and procedure. Electronically Signed   By: Corrie Mckusick D.O.   On: 07/07/2021 13:36   DG Hip Unilat With Pelvis 2-3 Views Left  Result Date: 07/06/2021 CLINICAL DATA:  Fall hip pain EXAM: DG HIP (WITH OR WITHOUT PELVIS) 2-3V LEFT COMPARISON:  None. FINDINGS: SI joints are non widened. Pubic symphysis and rami appear intact. Acute nondisplaced intertrochanteric fracture on the left. No femoral head dislocation IMPRESSION: Acute left intertrochanteric fracture Electronically Signed   By: Donavan Foil M.D.   On: 07/06/2021 23:31    Assessment/Plan  1. Anxiety disorder due to medical condition We discussed that some anxiety after such a big life change is normal. Slow progress is sometimes the case after a hip surgery with advanced age. I let her vent her feelings and provided affirmation. She can try a small amt of xanax as she feels that her anxiety is inhibiting her progress but would only use small doses and for a short time. If this continues over a long period of time we could consider adding zoloft  - ALPRAZolam (XANAX) 0.5 MG tablet; Take 0.5 tablets (0.25 mg total) by mouth daily as needed for anxiety.  Dispense: 14 tablet; Refill: 0'  Family/ staff Communication: resident   Labs/tests ordered:   na

## 2021-07-15 ENCOUNTER — Other Ambulatory Visit: Payer: Self-pay | Admitting: Adult Health

## 2021-07-15 DIAGNOSIS — R2689 Other abnormalities of gait and mobility: Secondary | ICD-10-CM | POA: Diagnosis not present

## 2021-07-15 DIAGNOSIS — R278 Other lack of coordination: Secondary | ICD-10-CM | POA: Diagnosis not present

## 2021-07-15 DIAGNOSIS — M62552 Muscle wasting and atrophy, not elsewhere classified, left thigh: Secondary | ICD-10-CM | POA: Diagnosis not present

## 2021-07-15 DIAGNOSIS — S72142S Displaced intertrochanteric fracture of left femur, sequela: Secondary | ICD-10-CM | POA: Diagnosis not present

## 2021-07-15 DIAGNOSIS — M25552 Pain in left hip: Secondary | ICD-10-CM | POA: Diagnosis not present

## 2021-07-15 DIAGNOSIS — Z4789 Encounter for other orthopedic aftercare: Secondary | ICD-10-CM | POA: Diagnosis not present

## 2021-07-15 DIAGNOSIS — Z9181 History of falling: Secondary | ICD-10-CM | POA: Diagnosis not present

## 2021-07-15 DIAGNOSIS — M6389 Disorders of muscle in diseases classified elsewhere, multiple sites: Secondary | ICD-10-CM | POA: Diagnosis not present

## 2021-07-15 DIAGNOSIS — S72002D Fracture of unspecified part of neck of left femur, subsequent encounter for closed fracture with routine healing: Secondary | ICD-10-CM

## 2021-07-15 MED ORDER — HYDROCODONE-ACETAMINOPHEN 5-325 MG PO TABS: 1.0000 | ORAL_TABLET | Freq: Two times a day (BID) | ORAL | 0 refills | Status: AC

## 2021-07-18 ENCOUNTER — Encounter: Payer: Self-pay | Admitting: Internal Medicine

## 2021-07-18 ENCOUNTER — Non-Acute Institutional Stay (SKILLED_NURSING_FACILITY): Payer: Medicare Other | Admitting: Internal Medicine

## 2021-07-18 DIAGNOSIS — S72142S Displaced intertrochanteric fracture of left femur, sequela: Secondary | ICD-10-CM | POA: Diagnosis not present

## 2021-07-18 DIAGNOSIS — M62552 Muscle wasting and atrophy, not elsewhere classified, left thigh: Secondary | ICD-10-CM | POA: Diagnosis not present

## 2021-07-18 DIAGNOSIS — S72002D Fracture of unspecified part of neck of left femur, subsequent encounter for closed fracture with routine healing: Secondary | ICD-10-CM | POA: Diagnosis not present

## 2021-07-18 DIAGNOSIS — M25552 Pain in left hip: Secondary | ICD-10-CM | POA: Diagnosis not present

## 2021-07-18 DIAGNOSIS — E785 Hyperlipidemia, unspecified: Secondary | ICD-10-CM | POA: Diagnosis not present

## 2021-07-18 DIAGNOSIS — F064 Anxiety disorder due to known physiological condition: Secondary | ICD-10-CM | POA: Diagnosis not present

## 2021-07-18 DIAGNOSIS — Z9181 History of falling: Secondary | ICD-10-CM | POA: Diagnosis not present

## 2021-07-18 DIAGNOSIS — Z4789 Encounter for other orthopedic aftercare: Secondary | ICD-10-CM | POA: Diagnosis not present

## 2021-07-18 DIAGNOSIS — R278 Other lack of coordination: Secondary | ICD-10-CM | POA: Diagnosis not present

## 2021-07-18 DIAGNOSIS — M6389 Disorders of muscle in diseases classified elsewhere, multiple sites: Secondary | ICD-10-CM | POA: Diagnosis not present

## 2021-07-18 DIAGNOSIS — R2689 Other abnormalities of gait and mobility: Secondary | ICD-10-CM | POA: Diagnosis not present

## 2021-07-18 DIAGNOSIS — I10 Essential (primary) hypertension: Secondary | ICD-10-CM | POA: Diagnosis not present

## 2021-07-18 DIAGNOSIS — F09 Unspecified mental disorder due to known physiological condition: Secondary | ICD-10-CM | POA: Diagnosis not present

## 2021-07-18 DIAGNOSIS — N1831 Chronic kidney disease, stage 3a: Secondary | ICD-10-CM | POA: Diagnosis not present

## 2021-07-18 DIAGNOSIS — C9110 Chronic lymphocytic leukemia of B-cell type not having achieved remission: Secondary | ICD-10-CM | POA: Diagnosis not present

## 2021-07-18 LAB — BASIC METABOLIC PANEL
BUN: 13 (ref 4–21)
BUN: 13 (ref 4–21)
CO2: 22 (ref 13–22)
CO2: 22 (ref 13–22)
Chloride: 104 (ref 99–108)
Chloride: 104 (ref 99–108)
Creatinine: 0.8 (ref 0.5–1.1)
Creatinine: 0.8 (ref 0.5–1.1)
Glucose: 97
Glucose: 97
Potassium: 4.9 (ref 3.4–5.3)
Potassium: 4.9 (ref 3.4–5.3)
Sodium: 142 (ref 137–147)
Sodium: 142 (ref 137–147)

## 2021-07-18 LAB — COMPREHENSIVE METABOLIC PANEL
Albumin: 3.7 (ref 3.5–5.0)
Albumin: 3.7 (ref 3.5–5.0)
Calcium: 10 (ref 8.7–10.7)
Calcium: 10 (ref 8.7–10.7)
Globulin: 1.8
Globulin: 1.8

## 2021-07-18 LAB — HEPATIC FUNCTION PANEL
ALT: 32 (ref 7–35)
ALT: 32 (ref 7–35)
AST: 21 (ref 13–35)
AST: 21 (ref 13–35)
Alkaline Phosphatase: 140 — AB (ref 25–125)
Alkaline Phosphatase: 140 — AB (ref 25–125)
Bilirubin, Total: 0.3
Bilirubin, Total: 0.3

## 2021-07-18 LAB — CBC AND DIFFERENTIAL
HCT: 28 — AB (ref 36–46)
HCT: 28 — AB (ref 36–46)
Hemoglobin: 8.8 — AB (ref 12.0–16.0)
Hemoglobin: 8.8 — AB (ref 12.0–16.0)
Platelets: 539 — AB (ref 150–399)
Platelets: 539 — AB (ref 150–399)
WBC: 19.9
WBC: 19.9

## 2021-07-18 LAB — CBC
RBC: 2.9 — AB (ref 3.87–5.11)
RBC: 2.9 — AB (ref 3.87–5.11)

## 2021-07-18 NOTE — Progress Notes (Signed)
Location:   [facility] Room Number: 155 Place of Service:  SNF [phone]) Provider:  Veleta Miners, MD  [name], MD  Patient Care Team: [name], MD as PCP - General (Internal Medicine) Belva Crome, MD as PCP - Cardiology (Cardiology)  Extended Emergency Contact Information Primary Emergency Contact: [name] F Address: [address]          [place], [place] [address] [name] of [place] Phone: [phone] Mobile Phone: [phone] Relation: Spouse  Code Status:  DNR Goals of care: Advanced Directive information Advanced Directives 07/18/2021  Does Patient Have a Medical Advance Directive? Yes  Type of Paramedic of [place];Living will;Out of facility DNR (pink MOST or yellow form)  Does patient want to make changes to medical advance directive? No - Patient declined  Copy of [place] in Chart? Yes - validated most recent copy scanned in chart (See row information)  Would patient like information on creating a medical advance directive? -  Pre-existing out of facility DNR order (yellow form or pink MOST form) Yellow form placed in chart (order not valid for inpatient use)     Chief Complaint  Patient presents with   Acute Visit    Rehab. Left hip fracture.    HPI:  Pt is a 87 y.o. female seen today for an acute visit for Follow up of her Pain Meds And Anxiety  Patient was admitted in the hospital from 11/23-11/26 after sustaining left hip fracture s/p IM nail . Patient has a history of CLL, GERD, HLD, stage III CKD, diastolic CHF and CAD   She fell in her apartment while setting the table. Was found to have a left intertrochanteric hip fracture.  Underwent IM nail placement on 37/48OL Postop was complicated by mild  delirium.Also has Anemia Now she is in SNF.  Her main issue is Anxiety and Pain control. Was started on Xanax for Anxiety Seems to be helping and Per therapy she is  doing well with pain control. Did not sleep well as she had to get up due to pain. Is Walking with therapy and doing well with 50% weight.  Has lost some weight  Wt Readings from Last 3 Encounters:  07/18/21 155 lb 6.4 oz (70.5 kg)  07/14/21 155 lb 6.4 oz (70.5 kg)  07/11/21 163 lb 12.8 oz (74.3 kg)      Past Medical History:  Diagnosis Date   Abnormal nuclear stress test 01/26/2009   POST EXERCISE PVC's NOTED SINCE 2003   CLL (chronic lymphocytic leukemia) ([facility]) 10/2009   DR. KALE   Colon polyp    HYPERPLASTIC   Diverticulosis    DJD (degenerative joint disease)    GERD (gastroesophageal reflux disease)    Hearing loss    DR. SHOEMAKER   Hypercholesterolemia    Hyperlipidemia    Iron deficiency anemia 05/2010   Lesion of breast    LEFT, SMALL   Microscopic hematuria    DR. PETERSON   Neuropathy    PERIPHERAL   Osteopenia    Renal cyst 10/2009   RENAL CYST ON CT 11/2009   Renal disease    STAGE 3   Tendon cysts    EXTENSOR 2MM RIGHT FIRST FINGER   Vitamin D deficiency 10/2011   Past Surgical History:  Procedure Laterality Date   INTRAMEDULLARY (IM) NAIL INTERTROCHANTERIC Left 07/07/2021   Procedure: INTRAMEDULLARY (IM) NAIL INTERTROCHANTRIC;  Surgeon: Paralee Cancel, MD;  Location: [facility];  Service:  Orthopedics;  Laterality: Left;   LEFT HEART CATHETERIZATION WITH CORONARY ANGIOGRAM N/A 12/25/2011   Procedure: LEFT HEART CATHETERIZATION WITH CORONARY ANGIOGRAM;  Surgeon: Sinclair Grooms, MD;  Location: [facility] CATH LAB;  Service: Cardiovascular;  Laterality: N/A;    Allergies  Allergen Reactions   Amoxicillin Other (See Comments)    HEADACHES Other reaction(s): Headache   Macrolides And Ketolides     Other reaction(s): Unknown   Metoprolol     Other reaction(s): hypotension   Ciprofloxacin Nausea Only and Rash    And tingling lips and itching   Macrodantin [Nitrofurantoin] Rash   Sulfa Antibiotics Nausea Only and Rash    And tingling lips and itching Other  reaction(s): Unknown    Allergies as of 07/18/2021       Reactions   Amoxicillin Other (See Comments)   HEADACHES Other reaction(s): Headache   Macrolides And Ketolides    Other reaction(s): Unknown   Metoprolol    Other reaction(s): hypotension   Ciprofloxacin Nausea Only, Rash   And tingling lips and itching   Macrodantin [nitrofurantoin] Rash   Sulfa Antibiotics Nausea Only, Rash   And tingling lips and itching Other reaction(s): Unknown        Medication List        Accurate as of July 18, 2021 10:32 AM. If you have any questions, ask your nurse or doctor.          acetaminophen 650 MG CR tablet Commonly known as: TYLENOL Take 650 mg by mouth 2 (two) times daily.   ALIGN PO Take 1 capsule by mouth as needed.   ALPRAZolam 0.25 MG tablet Commonly known as: XANAX Take 0.25 mg by mouth every 6 (six) hours as needed for anxiety. What changed: Another medication with the same name was removed. Continue taking this medication, and follow the directions you see here. Changed by: Virgie Dad, MD   aspirin 81 MG tablet Take 81 mg by mouth daily.   CO Q 10 PO Take 1 tablet by mouth daily.   docusate sodium 100 MG capsule Commonly known as: COLACE Take 100 mg by mouth 2 (two) times daily. Hold for loose stools   enoxaparin 40 MG/0.4ML injection Commonly known as: LOVENOX Inject 0.4 mLs (40 mg total) into the skin daily.   HYDROcodone-acetaminophen 5-325 MG tablet Commonly known as: NORCO/VICODIN Take 2 tablets by mouth every 6 (six) hours as needed for moderate pain. What changed: Another medication with the same name was removed. Continue taking this medication, and follow the directions you see here. Changed by: Virgie Dad, MD   HYDROcodone-acetaminophen 5-325 MG tablet Commonly known as: NORCO/VICODIN Take 1 tablet by mouth in the morning and at bedtime for 10 days. What changed: Another medication with the same name was removed. Continue  taking this medication, and follow the directions you see here. Changed by: Virgie Dad, MD   methocarbamol 500 MG tablet Commonly known as: ROBAXIN Take 250 mg by mouth every 4 (four) hours as needed for muscle spasms.   multivitamin with minerals Tabs tablet Take 1 tablet by mouth daily.   Nitrostat 0.4 MG SL tablet Generic drug: nitroGLYCERIN DISSOLVE ONE TABLET UNDERTONGUE AS DIRECTED AS    NEEDED FOR CHEST PAIN   polyethylene glycol 17 g packet Commonly known as: MIRALAX / GLYCOLAX Take 17 g by mouth daily.   rosuvastatin 5 MG tablet Commonly known as: CRESTOR Take 5 mg by mouth at bedtime.   traMADol 50 MG tablet  Commonly known as: ULTRAM Take 1 tablet (50 mg total) by mouth 2 (two) times daily as needed for moderate pain.   ULTRA FREEDA PO Take 1 tablet by mouth every morning.        Review of Systems  Constitutional:  Positive for activity change, appetite change and unexpected weight change.  HENT: Negative.    Respiratory: Negative.    Cardiovascular: Negative.   Gastrointestinal:  Positive for constipation.  Genitourinary: Negative.   Musculoskeletal:  Positive for arthralgias and gait problem.  Skin: Negative.   Neurological:  Positive for weakness.  Psychiatric/Behavioral:  Positive for confusion and sleep disturbance. The patient is nervous/anxious.    Immunization History  Administered Date(s) Administered   Influenza Split 07/05/2011, 05/30/2012, 05/28/2013, 06/09/2015, 06/16/2016   Influenza, High Dose Seasonal PF 05/04/2014, 05/04/2014, 05/28/2017, 04/30/2018, 06/22/2020   Influenza-Unspecified 05/14/2014, 06/03/2021   Moderna Sars-Covid-2 Vaccination 08/27/2019, 09/24/2019, 05/27/2021   PFIZER(Purple Top)SARS-COV-2 Vaccination 04/29/2020   Pneumococcal Conjugate-13 10/28/2013   Pneumococcal Polysaccharide-23 05/23/2005, 12/28/2017   Td 04/04/2005, 04/04/2005   Tdap 05/07/2015   Zoster, Live 11/27/2011   Pertinent  Health Maintenance Due   Topic Date Due   DEXA SCAN  Never done   INFLUENZA VACCINE  Completed   Fall Risk 07/07/2021 07/07/2021 07/08/2021 07/08/2021 07/09/2021  Patient Fall Risk Level High fall risk High fall risk High fall risk High fall risk High fall risk   Functional Status Survey:    Vitals:   07/18/21 0948  BP: 138/72  Pulse: 79  Resp: 18  Temp: 98.3 F (36.8 C)  SpO2: 98%  Weight: 155 lb 6.4 oz (70.5 kg)  Height: 5' 4.3" (1.633 m)   Body mass index is 26.43 kg/m. Physical Exam Vitals reviewed.  Constitutional:      Appearance: Normal appearance.  HENT:     Head: Normocephalic.     Nose: Nose normal.     Mouth/Throat:     Mouth: Mucous membranes are moist.     Pharynx: Oropharynx is clear.  Eyes:     Pupils: Pupils are equal, round, and reactive to light.  Cardiovascular:     Rate and Rhythm: Normal rate and regular rhythm.     Pulses: Normal pulses.     Heart sounds: Normal heart sounds. No murmur heard. Pulmonary:     Effort: Pulmonary effort is normal.     Breath sounds: Normal breath sounds.  Abdominal:     General: Abdomen is flat. Bowel sounds are normal.     Palpations: Abdomen is soft.  Musculoskeletal:        General: No swelling.     Cervical back: Neck supple.  Skin:    General: Skin is warm.  Neurological:     General: No focal deficit present.     Mental Status: She is alert and oriented to person, place, and time.  Psychiatric:        Mood and Affect: Mood normal.        Thought Content: Thought content normal.    Labs reviewed: Recent Labs    07/06/21 2335 07/07/21 0435 07/08/21 0514 07/09/21 0542  NA 136 137 137 136  K 4.3 4.4 3.9 3.5  CL 105 109 108 106  CO2 25 22 23 23   GLUCOSE 115* 113* 114* 130*  BUN 25* 21 13 19   CREATININE 0.95 0.87 0.74 0.81  CALCIUM 9.4 9.0 9.1 9.3  MG 2.1  --   --   --   PHOS 2.5  --   --   --  Recent Labs    10/19/20 1436 04/20/21 1401 07/06/21 2335  AST 21 17 21   ALT 15 10 15   ALKPHOS 77 72 61  BILITOT  0.5 0.5 0.8  PROT 6.8 6.8 6.3*  ALBUMIN 4.2 4.1 3.9   Recent Labs    04/20/21 1401 07/06/21 2335 07/07/21 0435 07/08/21 0514 07/09/21 0542  WBC 17.2* 18.2* 17.3* 16.9* 22.5*  NEUTROABS 4.1 11.3* 11.8*  --   --   HGB 11.6* 10.9* 10.3* 9.5* 8.9*  HCT 34.3* 32.6* 31.2* 29.8* 26.6*  MCV 92.2 94.5 94.3 97.7 95.0  PLT 297 260 247 208 215   Lab Results  Component Value Date   TSH 4.845 (H) 07/07/2021   No results found for: HGBA1C No results found for: CHOL, HDL, LDLCALC, LDLDIRECT, TRIG, CHOLHDL  Significant Diagnostic Results in last 30 days:  DG Chest 1 View  Result Date: 07/06/2021 CLINICAL DATA:  Fall with hip fracture EXAM: CHEST  1 VIEW COMPARISON:  02/26/2020 FINDINGS: No focal opacity or pleural effusion. Stable cardiomediastinal silhouette with aortic atherosclerosis. No pneumothorax. IMPRESSION: No active disease. Electronically Signed   By: Donavan Foil M.D.   On: 07/06/2021 23:32   DG Knee Complete 4 Views Left  Result Date: 07/06/2021 CLINICAL DATA:  Recent fall with knee pain, initial encounter EXAM: LEFT KNEE - COMPLETE 4+ VIEW COMPARISON:  None. FINDINGS: Degenerative changes of the left knee joint are seen. No acute fracture or dislocation is seen. Patella is within normal limits. No joint effusion is noted. IMPRESSION: Tricompartmental degenerative changes without acute abnormality. Electronically Signed   By: Inez Catalina M.D.   On: 07/06/2021 23:41   DG C-Arm 1-60 Min-No Report  Result Date: 07/07/2021 CLINICAL DATA:  87 year old female with left hip fracture EXAM: OPERATIVE LEFT HIP (WITH PELVIS IF PERFORMED) 3 VIEWS TECHNIQUE: Fluoroscopic spot image(s) were submitted for interpretation post-operatively. COMPARISON:  07/06/2021 FINDINGS: Limited intraoperative fluoroscopic spot images of open reduction internal fixation left hip fracture, with relative alignment maintained. IMPRESSION: Intraoperative fluoroscopic spot images of left hip ORIF. Please refer to  the dictated operative report for full details of intraoperative findings and procedure. Electronically Signed   By: Corrie Mckusick D.O.   On: 07/07/2021 13:36   ECHOCARDIOGRAM COMPLETE  Result Date: 07/08/2021    ECHOCARDIOGRAM REPORT   Patient Name:   AVERYANNA SAX Date of Exam: 07/08/2021 Medical Rec #:  [id number]      Height:       63.0 in Accession #:    [id number]     Weight:       163.8 lb Date of Birth:  27-Aug-1925      BSA:          1.776 m Patient Age:    67 years       BP:           144/62 mmHg Patient Gender: F              HR:           92 bpm. Exam Location:  Inpatient Procedure: 2D Echo, Color Doppler and Cardiac Doppler Indications:    Aortic aneurysm [facility]) [[id number]]  History:        Patient has prior history of Echocardiogram examinations, most                 recent 08/02/2017. CHF; Risk Factors:Hypertension and                 Dyslipidemia. Chronic kidney disease,  anemia. Left hip fracture.  Sonographer:    Darlina Sicilian RDCS Referring Phys: [place]  1. Left ventricular ejection fraction, by estimation, is 60 to 65%. The left ventricle has normal function. The left ventricle has no regional wall motion abnormalities. Left ventricular diastolic parameters are consistent with Grade II diastolic dysfunction (pseudonormalization). Elevated left ventricular end-diastolic pressure.  2. Right ventricular systolic function is normal. The right ventricular size is normal. There is normal pulmonary artery systolic pressure.  3. The mitral valve is abnormal. Trivial mitral valve regurgitation. No evidence of mitral stenosis.  4. The aortic valve is tricuspid. There is moderate calcification of the aortic valve. Aortic valve regurgitation is trivial. Mild aortic valve stenosis.  5. Aortic dilatation noted. There is mild dilatation of the ascending aorta, measuring 42 mm.  6. The inferior vena cava is normal in size with greater than 50% respiratory variability, suggesting  right atrial pressure of 3 mmHg. FINDINGS  Left Ventricle: Left ventricular ejection fraction, by estimation, is 60 to 65%. The left ventricle has normal function. The left ventricle has no regional wall motion abnormalities. The left ventricular internal cavity size was normal in size. There is  no left ventricular hypertrophy. Left ventricular diastolic parameters are consistent with Grade II diastolic dysfunction (pseudonormalization). Elevated left ventricular end-diastolic pressure. Right Ventricle: The right ventricular size is normal. No increase in right ventricular wall thickness. Right ventricular systolic function is normal. There is normal pulmonary artery systolic pressure. The tricuspid regurgitant velocity is 2.53 m/s, and  with an assumed right atrial pressure of 3 mmHg, the estimated right ventricular systolic pressure is 75.1 mmHg. Left Atrium: Left atrial size was normal in size. Right Atrium: Right atrial size was normal in size. Pericardium: There is no evidence of pericardial effusion. Mitral Valve: The mitral valve is abnormal. There is mild thickening of the mitral valve leaflet(s). There is mild calcification of the mitral valve leaflet(s). Trivial mitral valve regurgitation. No evidence of mitral valve stenosis. Tricuspid Valve: The tricuspid valve is normal in structure. Tricuspid valve regurgitation is mild . No evidence of tricuspid stenosis. Aortic Valve: The aortic valve is tricuspid. There is moderate calcification of the aortic valve. Aortic valve regurgitation is trivial. Mild aortic stenosis is present. Aortic valve mean gradient measures 8.5 mmHg. Aortic valve peak gradient measures 17.0 mmHg. Aortic valve area, by VTI measures 1.60 cm. Pulmonic Valve: The pulmonic valve was normal in structure. Pulmonic valve regurgitation is not visualized. No evidence of pulmonic stenosis. Aorta: The aortic root is normal in size and structure and aortic dilatation noted. There is mild  dilatation of the ascending aorta, measuring 42 mm. Venous: The inferior vena cava is normal in size with greater than 50% respiratory variability, suggesting right atrial pressure of 3 mmHg. IAS/Shunts: No atrial level shunt detected by color flow Doppler.  LEFT VENTRICLE PLAX 2D LVIDd:         4.10 cm   Diastology LVIDs:         2.20 cm   LV e' medial:    7.54 cm/s LV PW:         1.00 cm   LV E/e' medial:  11.9 LV IVS:        1.00 cm   LV e' lateral:   4.58 cm/s LVOT diam:     2.00 cm   LV E/e' lateral: 19.7 LV SV:         62 LV SV Index:   35 LVOT Area:  3.14 cm  RIGHT VENTRICLE RV S prime:     17.60 cm/s TAPSE (M-mode): 2.2 cm LEFT ATRIUM             Index LA diam:        2.80 cm 1.58 cm/m LA Vol (A2C):   53.5 ml 30.12 ml/m LA Vol (A4C):   53.9 ml 30.34 ml/m LA Biplane Vol: 57.6 ml 32.43 ml/m  AORTIC VALVE AV Area (Vmax):    1.49 cm AV Area (Vmean):   1.43 cm AV Area (VTI):     1.60 cm AV Vmax:           206.00 cm/s AV Vmean:          132.500 cm/s AV VTI:            0.384 m AV Peak Grad:      17.0 mmHg AV Mean Grad:      8.5 mmHg LVOT Vmax:         97.90 cm/s LVOT Vmean:        60.400 cm/s LVOT VTI:          0.196 m LVOT/AV VTI ratio: 0.51  AORTA Ao Root diam: 3.50 cm Ao STJ diam:  3.1 cm Ao Asc diam:  4.20 cm MITRAL VALVE                TRICUSPID VALVE MV Area (PHT): 4.71 cm     TR Peak grad:   25.6 mmHg MV Decel Time: 161 msec     TR Vmax:        253.00 cm/s MV E velocity: 90.00 cm/s MV A velocity: 105.00 cm/s  SHUNTS MV E/A ratio:  0.86         Systemic VTI:  0.20 m                             Systemic Diam: 2.00 cm Jenkins Rouge MD Electronically signed by Jenkins Rouge MD Signature Date/Time: 07/08/2021/10:18:10 AM    Final    DG HIP OPERATIVE UNILAT W OR W/O PELVIS LEFT  Result Date: 07/07/2021 CLINICAL DATA:  87 year old female with left hip fracture EXAM: OPERATIVE LEFT HIP (WITH PELVIS IF PERFORMED) 3 VIEWS TECHNIQUE: Fluoroscopic spot image(s) were submitted for interpretation  post-operatively. COMPARISON:  07/06/2021 FINDINGS: Limited intraoperative fluoroscopic spot images of open reduction internal fixation left hip fracture, with relative alignment maintained. IMPRESSION: Intraoperative fluoroscopic spot images of left hip ORIF. Please refer to the dictated operative report for full details of intraoperative findings and procedure. Electronically Signed   By: Corrie Mckusick D.O.   On: 07/07/2021 13:36   DG Hip Unilat With Pelvis 2-3 Views Left  Result Date: 07/06/2021 CLINICAL DATA:  Fall hip pain EXAM: DG HIP (WITH OR WITHOUT PELVIS) 2-3V LEFT COMPARISON:  None. FINDINGS: SI joints are non widened. Pubic symphysis and rami appear intact. Acute nondisplaced intertrochanteric fracture on the left. No femoral head dislocation IMPRESSION: Acute left intertrochanteric fracture Electronically Signed   By: Donavan Foil M.D.   On: 07/06/2021 23:31    Assessment/Plan Closed fracture of left hip with routine healing, subsequent encounter WBAT 50% Has follow up with Ortho On Lovenox Will continue Norco and Robaxin No Change in Meds Anxiety disorder due to medical condition Continue Xanax for now Will not change the dose Hyperlipidemia, unspecified hyperlipidemia type On Crestor follows with PCP  Mild cognitive disorder MMSE was 24/30 Faled her Clock drawing Also did  not get the year  2/3 in recall  Stage 3a chronic kidney disease ([place]) Creat stable Repeat Labs pending CLL (chronic lymphocytic leukemia) ([facility]) Repeat Labs pending  Anemia Repeat CBC Hold Iron right now as she is having issues with her Bowels Constipation Restart oN Miralx and Prune juice  Addendum Repeat Labs shows Hgb of 8.8 WBC of 19K Will start her on iron  Family/ staff Communication:   Labs/tests ordered:

## 2021-07-19 DIAGNOSIS — M25552 Pain in left hip: Secondary | ICD-10-CM | POA: Diagnosis not present

## 2021-07-19 DIAGNOSIS — Z9181 History of falling: Secondary | ICD-10-CM | POA: Diagnosis not present

## 2021-07-19 DIAGNOSIS — M62552 Muscle wasting and atrophy, not elsewhere classified, left thigh: Secondary | ICD-10-CM | POA: Diagnosis not present

## 2021-07-19 DIAGNOSIS — S72142S Displaced intertrochanteric fracture of left femur, sequela: Secondary | ICD-10-CM | POA: Diagnosis not present

## 2021-07-19 DIAGNOSIS — M6389 Disorders of muscle in diseases classified elsewhere, multiple sites: Secondary | ICD-10-CM | POA: Diagnosis not present

## 2021-07-19 DIAGNOSIS — R278 Other lack of coordination: Secondary | ICD-10-CM | POA: Diagnosis not present

## 2021-07-19 DIAGNOSIS — Z4789 Encounter for other orthopedic aftercare: Secondary | ICD-10-CM | POA: Diagnosis not present

## 2021-07-19 DIAGNOSIS — R2689 Other abnormalities of gait and mobility: Secondary | ICD-10-CM | POA: Diagnosis not present

## 2021-07-20 ENCOUNTER — Other Ambulatory Visit: Payer: Self-pay | Admitting: Orthopedic Surgery

## 2021-07-20 DIAGNOSIS — Z4789 Encounter for other orthopedic aftercare: Secondary | ICD-10-CM | POA: Diagnosis not present

## 2021-07-20 DIAGNOSIS — Z9181 History of falling: Secondary | ICD-10-CM | POA: Diagnosis not present

## 2021-07-20 DIAGNOSIS — M62552 Muscle wasting and atrophy, not elsewhere classified, left thigh: Secondary | ICD-10-CM | POA: Diagnosis not present

## 2021-07-20 DIAGNOSIS — S72002D Fracture of unspecified part of neck of left femur, subsequent encounter for closed fracture with routine healing: Secondary | ICD-10-CM

## 2021-07-20 DIAGNOSIS — M6389 Disorders of muscle in diseases classified elsewhere, multiple sites: Secondary | ICD-10-CM | POA: Diagnosis not present

## 2021-07-20 DIAGNOSIS — M25552 Pain in left hip: Secondary | ICD-10-CM | POA: Diagnosis not present

## 2021-07-20 DIAGNOSIS — R278 Other lack of coordination: Secondary | ICD-10-CM | POA: Diagnosis not present

## 2021-07-20 DIAGNOSIS — R2689 Other abnormalities of gait and mobility: Secondary | ICD-10-CM | POA: Diagnosis not present

## 2021-07-20 DIAGNOSIS — S72142S Displaced intertrochanteric fracture of left femur, sequela: Secondary | ICD-10-CM | POA: Diagnosis not present

## 2021-07-20 MED ORDER — HYDROCODONE-ACETAMINOPHEN 5-325 MG PO TABS: 1.0000 | ORAL_TABLET | Freq: Four times a day (QID) | ORAL | 0 refills | Status: DC | PRN

## 2021-07-21 DIAGNOSIS — R278 Other lack of coordination: Secondary | ICD-10-CM | POA: Diagnosis not present

## 2021-07-21 DIAGNOSIS — M25552 Pain in left hip: Secondary | ICD-10-CM | POA: Diagnosis not present

## 2021-07-21 DIAGNOSIS — S72142S Displaced intertrochanteric fracture of left femur, sequela: Secondary | ICD-10-CM | POA: Diagnosis not present

## 2021-07-21 DIAGNOSIS — Z4789 Encounter for other orthopedic aftercare: Secondary | ICD-10-CM | POA: Diagnosis not present

## 2021-07-21 DIAGNOSIS — Z9181 History of falling: Secondary | ICD-10-CM | POA: Diagnosis not present

## 2021-07-21 DIAGNOSIS — R2689 Other abnormalities of gait and mobility: Secondary | ICD-10-CM | POA: Diagnosis not present

## 2021-07-21 DIAGNOSIS — M62552 Muscle wasting and atrophy, not elsewhere classified, left thigh: Secondary | ICD-10-CM | POA: Diagnosis not present

## 2021-07-22 DIAGNOSIS — M6389 Disorders of muscle in diseases classified elsewhere, multiple sites: Secondary | ICD-10-CM | POA: Diagnosis not present

## 2021-07-22 DIAGNOSIS — Z4789 Encounter for other orthopedic aftercare: Secondary | ICD-10-CM | POA: Diagnosis not present

## 2021-07-22 DIAGNOSIS — S72142S Displaced intertrochanteric fracture of left femur, sequela: Secondary | ICD-10-CM | POA: Diagnosis not present

## 2021-07-22 DIAGNOSIS — R278 Other lack of coordination: Secondary | ICD-10-CM | POA: Diagnosis not present

## 2021-07-22 DIAGNOSIS — Z9181 History of falling: Secondary | ICD-10-CM | POA: Diagnosis not present

## 2021-07-22 DIAGNOSIS — S72142A Displaced intertrochanteric fracture of left femur, initial encounter for closed fracture: Secondary | ICD-10-CM | POA: Diagnosis not present

## 2021-07-25 DIAGNOSIS — M25552 Pain in left hip: Secondary | ICD-10-CM | POA: Diagnosis not present

## 2021-07-25 DIAGNOSIS — R278 Other lack of coordination: Secondary | ICD-10-CM | POA: Diagnosis not present

## 2021-07-25 DIAGNOSIS — R2689 Other abnormalities of gait and mobility: Secondary | ICD-10-CM | POA: Diagnosis not present

## 2021-07-25 DIAGNOSIS — M62552 Muscle wasting and atrophy, not elsewhere classified, left thigh: Secondary | ICD-10-CM | POA: Diagnosis not present

## 2021-07-25 DIAGNOSIS — Z4789 Encounter for other orthopedic aftercare: Secondary | ICD-10-CM | POA: Diagnosis not present

## 2021-07-25 DIAGNOSIS — Z9181 History of falling: Secondary | ICD-10-CM | POA: Diagnosis not present

## 2021-07-25 DIAGNOSIS — M6389 Disorders of muscle in diseases classified elsewhere, multiple sites: Secondary | ICD-10-CM | POA: Diagnosis not present

## 2021-07-25 DIAGNOSIS — S72142S Displaced intertrochanteric fracture of left femur, sequela: Secondary | ICD-10-CM | POA: Diagnosis not present

## 2021-07-26 DIAGNOSIS — Z4789 Encounter for other orthopedic aftercare: Secondary | ICD-10-CM | POA: Diagnosis not present

## 2021-07-26 DIAGNOSIS — R278 Other lack of coordination: Secondary | ICD-10-CM | POA: Diagnosis not present

## 2021-07-26 DIAGNOSIS — S72142S Displaced intertrochanteric fracture of left femur, sequela: Secondary | ICD-10-CM | POA: Diagnosis not present

## 2021-07-26 DIAGNOSIS — Z9181 History of falling: Secondary | ICD-10-CM | POA: Diagnosis not present

## 2021-07-26 DIAGNOSIS — M62552 Muscle wasting and atrophy, not elsewhere classified, left thigh: Secondary | ICD-10-CM | POA: Diagnosis not present

## 2021-07-26 DIAGNOSIS — R2689 Other abnormalities of gait and mobility: Secondary | ICD-10-CM | POA: Diagnosis not present

## 2021-07-26 DIAGNOSIS — M6389 Disorders of muscle in diseases classified elsewhere, multiple sites: Secondary | ICD-10-CM | POA: Diagnosis not present

## 2021-07-26 DIAGNOSIS — M25552 Pain in left hip: Secondary | ICD-10-CM | POA: Diagnosis not present

## 2021-07-27 ENCOUNTER — Non-Acute Institutional Stay (SKILLED_NURSING_FACILITY): Payer: Medicare Other | Admitting: Orthopedic Surgery

## 2021-07-27 ENCOUNTER — Encounter: Payer: Self-pay | Admitting: Orthopedic Surgery

## 2021-07-27 ENCOUNTER — Other Ambulatory Visit: Payer: Self-pay | Admitting: Orthopedic Surgery

## 2021-07-27 DIAGNOSIS — N1831 Chronic kidney disease, stage 3a: Secondary | ICD-10-CM

## 2021-07-27 DIAGNOSIS — C9110 Chronic lymphocytic leukemia of B-cell type not having achieved remission: Secondary | ICD-10-CM | POA: Diagnosis not present

## 2021-07-27 DIAGNOSIS — F09 Unspecified mental disorder due to known physiological condition: Secondary | ICD-10-CM

## 2021-07-27 DIAGNOSIS — M6389 Disorders of muscle in diseases classified elsewhere, multiple sites: Secondary | ICD-10-CM | POA: Diagnosis not present

## 2021-07-27 DIAGNOSIS — Z9181 History of falling: Secondary | ICD-10-CM | POA: Diagnosis not present

## 2021-07-27 DIAGNOSIS — S72002D Fracture of unspecified part of neck of left femur, subsequent encounter for closed fracture with routine healing: Secondary | ICD-10-CM | POA: Diagnosis not present

## 2021-07-27 DIAGNOSIS — E785 Hyperlipidemia, unspecified: Secondary | ICD-10-CM | POA: Diagnosis not present

## 2021-07-27 DIAGNOSIS — F064 Anxiety disorder due to known physiological condition: Secondary | ICD-10-CM | POA: Diagnosis not present

## 2021-07-27 DIAGNOSIS — R2689 Other abnormalities of gait and mobility: Secondary | ICD-10-CM | POA: Diagnosis not present

## 2021-07-27 DIAGNOSIS — Z4789 Encounter for other orthopedic aftercare: Secondary | ICD-10-CM | POA: Diagnosis not present

## 2021-07-27 DIAGNOSIS — K219 Gastro-esophageal reflux disease without esophagitis: Secondary | ICD-10-CM | POA: Diagnosis not present

## 2021-07-27 DIAGNOSIS — S72142S Displaced intertrochanteric fracture of left femur, sequela: Secondary | ICD-10-CM | POA: Diagnosis not present

## 2021-07-27 DIAGNOSIS — D509 Iron deficiency anemia, unspecified: Secondary | ICD-10-CM | POA: Diagnosis not present

## 2021-07-27 DIAGNOSIS — M62552 Muscle wasting and atrophy, not elsewhere classified, left thigh: Secondary | ICD-10-CM | POA: Diagnosis not present

## 2021-07-27 DIAGNOSIS — M25552 Pain in left hip: Secondary | ICD-10-CM | POA: Diagnosis not present

## 2021-07-27 DIAGNOSIS — K5903 Drug induced constipation: Secondary | ICD-10-CM | POA: Diagnosis not present

## 2021-07-27 DIAGNOSIS — R278 Other lack of coordination: Secondary | ICD-10-CM | POA: Diagnosis not present

## 2021-07-27 MED ORDER — TRAMADOL HCL 50 MG PO TABS: 50.0000 mg | ORAL_TABLET | Freq: Every day | ORAL | 0 refills | Status: DC | PRN

## 2021-07-27 MED ORDER — HYDROCODONE-ACETAMINOPHEN 5-325 MG PO TABS: 1.0000 | ORAL_TABLET | Freq: Two times a day (BID) | ORAL | 0 refills | Status: DC

## 2021-07-27 NOTE — Progress Notes (Signed)
Location:   [place] Room Number: [place]:  SNF (31) Provider:  Windell Moulding, NP  [name], MD  Patient Care Team: [name], MD as PCP - General (Internal Medicine) Belva Crome, MD as PCP - Cardiology (Cardiology)  Extended Emergency Contact Information Primary Emergency Contact: [name] F Address: [address]          [place], [place] [address] [name] of [place] Phone: [phone] Mobile Phone: [phone] Relation: Spouse  Code Status:  DNR Goals of care: Advanced Directive information Advanced Directives 07/27/2021  Does Patient Have a Medical Advance Directive? Yes  Type of Paramedic of [place];Living will;Out of facility DNR (pink MOST or yellow form)  Does patient want to make changes to medical advance directive? No - Patient declined  Copy of [place] in Chart? Yes - validated most recent copy scanned in chart (See row information)  Would patient like information on creating a medical advance directive? -  Pre-existing out of facility DNR order (yellow form or pink MOST form) Yellow form placed in chart (order not valid for inpatient use)     Chief Complaint  Patient presents with   Acute Visit    Panic attack    HPI:  Rebecca King is a 87 y.o. female seen today for an acute visit for panic attack.   Rebecca King currently resides on the rehabilitation unit at [facility] due to recent left hip fracture. PMH: angina, ascending aortic aneurysm, CAD, CHF, GERD, cognitive disorder, peripheral neuropathy, osteopenia, CKD III, CLL, HLD and iron deficiency anemia.   Anxiety- recently having increased anxiety after hip surgery, Xanax started prn, but Rebecca King continues to be anxious most of the day, reports having a panic attack last night while trying to go to bed, difficulty falling asleep last few nights as well Left hip- hospitalized 11/23-11/26, s/p IM nail, pain  controlled with norco, lovenox for dvt prophylaxis, walking with therapy, remains PWB 50% HLD- remains on Crestor Mild cognitive disorder- MMSE 24/30, no recent behavioral outbursts CKD 3a- BUN/creat 13/0.8 07/18/2021 CLL- followed by oncology, continues watchful observation, f/u in 6 months GERD- continues to have heartburn, requesting to be back on home Pepcid Anemia- hgb 8.8 07/18/2021, off iron due to upset stomach Constipation- suspect due to recent narcotic use, miralax daily, also drinking prune juice   Past Medical History:  Diagnosis Date   Abnormal nuclear stress test 01/26/2009   POST EXERCISE PVC's NOTED SINCE 2003   CLL (chronic lymphocytic leukemia) ([place]) 10/2009   DR. KALE   Colon polyp    HYPERPLASTIC   Diverticulosis    DJD (degenerative joint disease)    GERD (gastroesophageal reflux disease)    Hearing loss    DR. SHOEMAKER   Hypercholesterolemia    Hyperlipidemia    Iron deficiency anemia 05/2010   Lesion of breast    LEFT, SMALL   Microscopic hematuria    DR. PETERSON   Neuropathy    PERIPHERAL   Osteopenia    Renal cyst 10/2009   RENAL CYST ON CT 11/2009   Renal disease    STAGE 3   Tendon cysts    EXTENSOR 2MM RIGHT FIRST FINGER   Vitamin D deficiency 10/2011   Past Surgical History:  Procedure Laterality Date   INTRAMEDULLARY (IM) NAIL INTERTROCHANTERIC Left 07/07/2021   Procedure: INTRAMEDULLARY (IM) NAIL INTERTROCHANTRIC;  Surgeon: Paralee Cancel, MD;  Location: [facility] ORS;  Service: Orthopedics;  Laterality: Left;  LEFT HEART CATHETERIZATION WITH CORONARY ANGIOGRAM N/A 12/25/2011   Procedure: LEFT HEART CATHETERIZATION WITH CORONARY ANGIOGRAM;  Surgeon: Sinclair Grooms, MD;  Location: [facility] CATH LAB;  Service: Cardiovascular;  Laterality: N/A;    Allergies  Allergen Reactions   Amoxicillin Other (See Comments)    HEADACHES Other reaction(s): Headache   Macrolides And Ketolides     Other reaction(s): Unknown   Metoprolol     Other  reaction(s): hypotension   Ciprofloxacin Nausea Only and Rash    And tingling lips and itching   Macrodantin [Nitrofurantoin] Rash   Sulfa Antibiotics Nausea Only and Rash    And tingling lips and itching Other reaction(s): Unknown    Allergies as of 07/27/2021       Reactions   Amoxicillin Other (See Comments)   HEADACHES Other reaction(s): Headache   Macrolides And Ketolides    Other reaction(s): Unknown   Metoprolol    Other reaction(s): hypotension   Ciprofloxacin Nausea Only, Rash   And tingling lips and itching   Macrodantin [nitrofurantoin] Rash   Sulfa Antibiotics Nausea Only, Rash   And tingling lips and itching Other reaction(s): Unknown        Medication List        Accurate as of July 27, 2021  4:36 PM. If you have any questions, ask your nurse or doctor.          acetaminophen 650 MG CR tablet Commonly known as: TYLENOL Take 650 mg by mouth 2 (two) times daily.   ALIGN PO Take 1 capsule by mouth as needed.   ALPRAZolam 0.25 MG tablet Commonly known as: XANAX Take 0.25 mg by mouth every 6 (six) hours as needed for anxiety.   aspirin 81 MG tablet Take 81 mg by mouth daily.   CO Q 10 PO Take 1 tablet by mouth daily.   docusate sodium 100 MG capsule Commonly known as: COLACE Take 100 mg by mouth 2 (two) times daily. Hold for loose stools   enoxaparin 40 MG/0.4ML injection Commonly known as: LOVENOX Inject 0.4 mLs (40 mg total) into the skin daily.   ferrous sulfate 325 (65 FE) MG tablet Take 325 mg by mouth 3 (three) times a week.   HYDROcodone-acetaminophen 5-325 MG tablet Commonly known as: NORCO/VICODIN Take 2 tablets by mouth every 6 (six) hours as needed for moderate pain. What changed: Another medication with the same name was changed. Make sure you understand how and when to take each. Changed by: Yvonna Alanis, NP   HYDROcodone-acetaminophen 5-325 MG tablet Commonly known as: NORCO/VICODIN Take 1 tablet by mouth every 6  (six) hours as needed for moderate pain. What changed: Another medication with the same name was changed. Make sure you understand how and when to take each. Changed by: Yvonna Alanis, NP   HYDROcodone-acetaminophen 5-325 MG tablet Commonly known as: NORCO/VICODIN Take 1 tablet by mouth 2 (two) times daily. What changed:  how much to take when to take this reasons to take this Changed by: Yvonna Alanis, NP   methocarbamol 500 MG tablet Commonly known as: ROBAXIN Take 250 mg by mouth every 4 (four) hours as needed for muscle spasms.   MOVE FREE ULTRA JOINT HEALTH PO Take 1 tablet by mouth daily.   multivitamin with minerals Tabs tablet Take 1 tablet by mouth daily.   Nitrostat 0.4 MG SL tablet Generic drug: nitroGLYCERIN DISSOLVE ONE TABLET UNDERTONGUE AS DIRECTED AS    NEEDED FOR CHEST PAIN   polyethylene  glycol 17 g packet Commonly known as: MIRALAX / GLYCOLAX Take 17 g by mouth daily.   rosuvastatin 5 MG tablet Commonly known as: CRESTOR Take 5 mg by mouth at bedtime.   sertraline 25 MG tablet Commonly known as: ZOLOFT Take 25 mg by mouth daily.   traMADol 50 MG tablet Commonly known as: ULTRAM Take 1 tablet (50 mg total) by mouth daily as needed for moderate pain. What changed: when to take this Changed by: Yvonna Alanis, NP   ULTRA FREEDA PO Take 1 tablet by mouth every morning.        Review of Systems  Constitutional:  Negative for activity change, appetite change, chills, fatigue and fever.  HENT:  Negative for congestion, sore throat and trouble swallowing.   Eyes:  Negative for visual disturbance.  Respiratory:  Negative for cough, shortness of breath and wheezing.   Cardiovascular:  Negative for chest pain and leg swelling.  Gastrointestinal:  Positive for constipation. Negative for abdominal distention, abdominal pain, diarrhea, nausea, rectal pain and vomiting.       Heartburn  Genitourinary:  Negative for dysuria, frequency and hematuria.   Musculoskeletal:  Positive for arthralgias and gait problem.  Skin:  Negative for rash and wound.  Neurological:  Negative for dizziness, weakness and headaches.  Psychiatric/Behavioral:  Positive for confusion. Negative for dysphoric mood. The patient is nervous/anxious.    Immunization History  Administered Date(s) Administered   Influenza Split 07/05/2011, 05/30/2012, 05/28/2013, 06/09/2015, 06/16/2016   Influenza, High Dose Seasonal PF 05/04/2014, 05/04/2014, 05/28/2017, 04/30/2018, 06/22/2020   Influenza-Unspecified 05/14/2014, 06/03/2021   Moderna Sars-Covid-2 Vaccination 08/27/2019, 09/24/2019, 05/27/2021   PFIZER(Purple Top)SARS-COV-2 Vaccination 04/29/2020   Pneumococcal Conjugate-13 10/28/2013   Pneumococcal Polysaccharide-23 05/23/2005, 12/28/2017   Td 04/04/2005, 04/04/2005   Tdap 05/07/2015   Zoster, Live 11/27/2011   Pertinent  Health Maintenance Due  Topic Date Due   DEXA SCAN  Never done   INFLUENZA VACCINE  Completed   Fall Risk 07/07/2021 07/07/2021 07/08/2021 07/08/2021 07/09/2021  Patient Fall Risk Level High fall risk High fall risk High fall risk High fall risk High fall risk   Functional Status Survey:    Vitals:   07/27/21 1618  BP: 123/68  Pulse: 65  Resp: 14  Temp: 98.2 F (36.8 C)  SpO2: 98%  Weight: 154 lb 12.8 oz (70.2 kg)  Height: 5' 4.3" (1.633 m)   Body mass index is 26.32 kg/m. Physical Exam Vitals reviewed.  Constitutional:      General: Rebecca King is not in acute distress. HENT:     Head: Normocephalic.  Eyes:     General:        Right eye: No discharge.        Left eye: No discharge.  Neck:     Vascular: No carotid bruit.  Cardiovascular:     Rate and Rhythm: Normal rate and regular rhythm.     Pulses: Normal pulses.     Heart sounds: Normal heart sounds. No murmur heard. Pulmonary:     Effort: Pulmonary effort is normal. No respiratory distress.     Breath sounds: Normal breath sounds. No wheezing.  Abdominal:     General:  Bowel sounds are normal. There is no distension.     Palpations: Abdomen is soft.     Tenderness: There is no abdominal tenderness.  Musculoskeletal:     Cervical back: Normal range of motion.     Right lower leg: No edema.     Left lower leg: No  edema.     Comments: Left hip incision, CDI, no drainage, surrounding skin intact  Lymphadenopathy:     Cervical: No cervical adenopathy.  Skin:    General: Skin is warm and dry.     Capillary Refill: Capillary refill takes less than 2 seconds.  Neurological:     General: No focal deficit present.     Mental Status: Rebecca King is alert and oriented to person, place, and time.     Motor: Weakness present.     Gait: Gait abnormal.  Psychiatric:        Mood and Affect: Mood is anxious.        Behavior: Behavior normal.     Comments: Forgetful during encounter, followed commands, described needs well    Labs reviewed: Recent Labs    07/06/21 2335 07/07/21 0435 07/08/21 0514 07/09/21 0542 07/18/21 0000  NA 136 137 137 136 142  K 4.3 4.4 3.9 3.5 4.9  CL 105 109 108 106 104  CO2 25 22 23 23 22   GLUCOSE 115* 113* 114* 130*  --   BUN 25* 21 13 19 13   CREATININE 0.95 0.87 0.74 0.81 0.8  CALCIUM 9.4 9.0 9.1 9.3 10.0  MG 2.1  --   --   --   --   PHOS 2.5  --   --   --   --    Recent Labs    10/19/20 1436 04/20/21 1401 07/06/21 2335 07/18/21 0000  AST 21 17 21 21   ALT 15 10 15  32  ALKPHOS 77 72 61 140*  BILITOT 0.5 0.5 0.8  --   PROT 6.8 6.8 6.3*  --   ALBUMIN 4.2 4.1 3.9 3.7   Recent Labs    04/20/21 1401 07/06/21 2335 07/07/21 0435 07/08/21 0514 07/09/21 0542 07/18/21 0000  WBC 17.2* 18.2* 17.3* 16.9* 22.5* 19.9  NEUTROABS 4.1 11.3* 11.8*  --   --   --   HGB 11.6* 10.9* 10.3* 9.5* 8.9* 8.8*  HCT 34.3* 32.6* 31.2* 29.8* 26.6* 28*  MCV 92.2 94.5 94.3 97.7 95.0  --   PLT 297 260 247 208 215 539*   Lab Results  Component Value Date   TSH 4.845 (H) 07/07/2021   No results found for: HGBA1C No results found for: CHOL, HDL,  LDLCALC, LDLDIRECT, TRIG, CHOLHDL  Significant Diagnostic Results in last 30 days:  DG Chest 1 View  Result Date: 07/06/2021 CLINICAL DATA:  Fall with hip fracture EXAM: CHEST  1 VIEW COMPARISON:  02/26/2020 FINDINGS: No focal opacity or pleural effusion. Stable cardiomediastinal silhouette with aortic atherosclerosis. No pneumothorax. IMPRESSION: No active disease. Electronically Signed   By: Donavan Foil M.D.   On: 07/06/2021 23:32   DG Knee Complete 4 Views Left  Result Date: 07/06/2021 CLINICAL DATA:  Recent fall with knee pain, initial encounter EXAM: LEFT KNEE - COMPLETE 4+ VIEW COMPARISON:  None. FINDINGS: Degenerative changes of the left knee joint are seen. No acute fracture or dislocation is seen. Patella is within normal limits. No joint effusion is noted. IMPRESSION: Tricompartmental degenerative changes without acute abnormality. Electronically Signed   By: Inez Catalina M.D.   On: 07/06/2021 23:41   DG C-Arm 1-60 Min-No Report  Result Date: 07/07/2021 CLINICAL DATA:  87 year old female with left hip fracture EXAM: OPERATIVE LEFT HIP (WITH PELVIS IF PERFORMED) 3 VIEWS TECHNIQUE: Fluoroscopic spot image(s) were submitted for interpretation post-operatively. COMPARISON:  07/06/2021 FINDINGS: Limited intraoperative fluoroscopic spot images of open reduction internal fixation left hip fracture, with  relative alignment maintained. IMPRESSION: Intraoperative fluoroscopic spot images of left hip ORIF. Please refer to the dictated operative report for full details of intraoperative findings and procedure. Electronically Signed   By: Corrie Mckusick D.O.   On: 07/07/2021 13:36   ECHOCARDIOGRAM COMPLETE  Result Date: 07/08/2021    ECHOCARDIOGRAM REPORT   Patient Name:   Rebecca King Date of Exam: 07/08/2021 Medical Rec #:  [id number]      Height:       63.0 in Accession #:    [id number]     Weight:       163.8 lb Date of Birth:  Dec 13, 1925      BSA:          1.776 m Patient Age:    65 years        BP:           144/62 mmHg Patient Gender: F              HR:           92 bpm. Exam Location:  Inpatient Procedure: 2D Echo, Color Doppler and Cardiac Doppler Indications:    Aortic aneurysm [facility]) [[id number]]  History:        Patient has prior history of Echocardiogram examinations, most                 recent 08/02/2017. CHF; Risk Factors:Hypertension and                 Dyslipidemia. Chronic kidney disease, anemia. Left hip fracture.  Sonographer:    Darlina Sicilian RDCS Referring Phys: [place]  1. Left ventricular ejection fraction, by estimation, is 60 to 65%. The left ventricle has normal function. The left ventricle has no regional wall motion abnormalities. Left ventricular diastolic parameters are consistent with Grade II diastolic dysfunction (pseudonormalization). Elevated left ventricular end-diastolic pressure.  2. Right ventricular systolic function is normal. The right ventricular size is normal. There is normal pulmonary artery systolic pressure.  3. The mitral valve is abnormal. Trivial mitral valve regurgitation. No evidence of mitral stenosis.  4. The aortic valve is tricuspid. There is moderate calcification of the aortic valve. Aortic valve regurgitation is trivial. Mild aortic valve stenosis.  5. Aortic dilatation noted. There is mild dilatation of the ascending aorta, measuring 42 mm.  6. The inferior vena cava is normal in size with greater than 50% respiratory variability, suggesting right atrial pressure of 3 mmHg. FINDINGS  Left Ventricle: Left ventricular ejection fraction, by estimation, is 60 to 65%. The left ventricle has normal function. The left ventricle has no regional wall motion abnormalities. The left ventricular internal cavity size was normal in size. There is  no left ventricular hypertrophy. Left ventricular diastolic parameters are consistent with Grade II diastolic dysfunction (pseudonormalization). Elevated left ventricular end-diastolic  pressure. Right Ventricle: The right ventricular size is normal. No increase in right ventricular wall thickness. Right ventricular systolic function is normal. There is normal pulmonary artery systolic pressure. The tricuspid regurgitant velocity is 2.53 m/s, and  with an assumed right atrial pressure of 3 mmHg, the estimated right ventricular systolic pressure is 96.7 mmHg. Left Atrium: Left atrial size was normal in size. Right Atrium: Right atrial size was normal in size. Pericardium: There is no evidence of pericardial effusion. Mitral Valve: The mitral valve is abnormal. There is mild thickening of the mitral valve leaflet(s). There is mild calcification of the mitral valve leaflet(s). Trivial mitral valve regurgitation. No evidence  of mitral valve stenosis. Tricuspid Valve: The tricuspid valve is normal in structure. Tricuspid valve regurgitation is mild . No evidence of tricuspid stenosis. Aortic Valve: The aortic valve is tricuspid. There is moderate calcification of the aortic valve. Aortic valve regurgitation is trivial. Mild aortic stenosis is present. Aortic valve mean gradient measures 8.5 mmHg. Aortic valve peak gradient measures 17.0 mmHg. Aortic valve area, by VTI measures 1.60 cm. Pulmonic Valve: The pulmonic valve was normal in structure. Pulmonic valve regurgitation is not visualized. No evidence of pulmonic stenosis. Aorta: The aortic root is normal in size and structure and aortic dilatation noted. There is mild dilatation of the ascending aorta, measuring 42 mm. Venous: The inferior vena cava is normal in size with greater than 50% respiratory variability, suggesting right atrial pressure of 3 mmHg. IAS/Shunts: No atrial level shunt detected by color flow Doppler.  LEFT VENTRICLE PLAX 2D LVIDd:         4.10 cm   Diastology LVIDs:         2.20 cm   LV e' medial:    7.54 cm/s LV PW:         1.00 cm   LV E/e' medial:  11.9 LV IVS:        1.00 cm   LV e' lateral:   4.58 cm/s LVOT diam:     2.00  cm   LV E/e' lateral: 19.7 LV SV:         62 LV SV Index:   35 LVOT Area:     3.14 cm  RIGHT VENTRICLE RV S prime:     17.60 cm/s TAPSE (M-mode): 2.2 cm LEFT ATRIUM             Index LA diam:        2.80 cm 1.58 cm/m LA Vol (A2C):   53.5 ml 30.12 ml/m LA Vol (A4C):   53.9 ml 30.34 ml/m LA Biplane Vol: 57.6 ml 32.43 ml/m  AORTIC VALVE AV Area (Vmax):    1.49 cm AV Area (Vmean):   1.43 cm AV Area (VTI):     1.60 cm AV Vmax:           206.00 cm/s AV Vmean:          132.500 cm/s AV VTI:            0.384 m AV Peak Grad:      17.0 mmHg AV Mean Grad:      8.5 mmHg LVOT Vmax:         97.90 cm/s LVOT Vmean:        60.400 cm/s LVOT VTI:          0.196 m LVOT/AV VTI ratio: 0.51  AORTA Ao Root diam: 3.50 cm Ao STJ diam:  3.1 cm Ao Asc diam:  4.20 cm MITRAL VALVE                TRICUSPID VALVE MV Area (PHT): 4.71 cm     TR Peak grad:   25.6 mmHg MV Decel Time: 161 msec     TR Vmax:        253.00 cm/s MV E velocity: 90.00 cm/s MV A velocity: 105.00 cm/s  SHUNTS MV E/A ratio:  0.86         Systemic VTI:  0.20 m                             Systemic Diam: 2.00  cm Jenkins Rouge MD Electronically signed by Jenkins Rouge MD Signature Date/Time: 07/08/2021/10:18:10 AM    Final    DG HIP OPERATIVE UNILAT W OR W/O PELVIS LEFT  Result Date: 07/07/2021 CLINICAL DATA:  87 year old female with left hip fracture EXAM: OPERATIVE LEFT HIP (WITH PELVIS IF PERFORMED) 3 VIEWS TECHNIQUE: Fluoroscopic spot image(s) were submitted for interpretation post-operatively. COMPARISON:  07/06/2021 FINDINGS: Limited intraoperative fluoroscopic spot images of open reduction internal fixation left hip fracture, with relative alignment maintained. IMPRESSION: Intraoperative fluoroscopic spot images of left hip ORIF. Please refer to the dictated operative report for full details of intraoperative findings and procedure. Electronically Signed   By: Corrie Mckusick D.O.   On: 07/07/2021 13:36   DG Hip Unilat With Pelvis 2-3 Views Left  Result Date:  07/06/2021 CLINICAL DATA:  Fall hip pain EXAM: DG HIP (WITH OR WITHOUT PELVIS) 2-3V LEFT COMPARISON:  None. FINDINGS: SI joints are non widened. Pubic symphysis and rami appear intact. Acute nondisplaced intertrochanteric fracture on the left. No femoral head dislocation IMPRESSION: Acute left intertrochanteric fracture Electronically Signed   By: Donavan Foil M.D.   On: 07/06/2021 23:31    Assessment/Plan 1. Anxiety disorder due to medical condition - increased anxiety after surgery - panic attack last night - appears anxious today - cont xanax prn - start zoloft 25 mg daily - bmp in 1 week  2. Closed fracture of left hip with routine healing, subsequent encounter - pain tolerable with norco - remains PWB 50% - cont lovenox for dvt prophylaxis  3. Hyperlipidemia, unspecified hyperlipidemia type - cont crestor  4. Mild cognitive disorder - MMSE 24/30 - forgetful, answers appropriately today - oncology recommended f/u CT/MRI brain  5. Stage 3a chronic kidney disease (HCC) - BUN/creat stable - encourage fluids  6. CLL (chronic lymphocytic leukemia) ([place]) - followed by oncology- watchful observation  7. Gastroesophageal reflux disease without esophagitis - increased reflux - start pepcid 20 mg po daily  8. Iron deficiency anemia, unspecified iron deficiency anemia type - hgb 8.8 07/18/2021 - iron on hold due to upset stomach  9. Drug-induced constipation - suspect due to recent norco use - cont miralax daily and prune juice    Family/ staff Communication: plan discussed with patient and nurse  Labs/tests ordered:   bmp in 1 week

## 2021-07-28 DIAGNOSIS — R278 Other lack of coordination: Secondary | ICD-10-CM | POA: Diagnosis not present

## 2021-07-28 DIAGNOSIS — M6389 Disorders of muscle in diseases classified elsewhere, multiple sites: Secondary | ICD-10-CM | POA: Diagnosis not present

## 2021-07-28 DIAGNOSIS — Z9181 History of falling: Secondary | ICD-10-CM | POA: Diagnosis not present

## 2021-07-28 DIAGNOSIS — Z4789 Encounter for other orthopedic aftercare: Secondary | ICD-10-CM | POA: Diagnosis not present

## 2021-07-28 DIAGNOSIS — M25552 Pain in left hip: Secondary | ICD-10-CM | POA: Diagnosis not present

## 2021-07-28 DIAGNOSIS — M62552 Muscle wasting and atrophy, not elsewhere classified, left thigh: Secondary | ICD-10-CM | POA: Diagnosis not present

## 2021-07-28 DIAGNOSIS — R2689 Other abnormalities of gait and mobility: Secondary | ICD-10-CM | POA: Diagnosis not present

## 2021-07-28 DIAGNOSIS — S72142S Displaced intertrochanteric fracture of left femur, sequela: Secondary | ICD-10-CM | POA: Diagnosis not present

## 2021-07-29 DIAGNOSIS — S72142S Displaced intertrochanteric fracture of left femur, sequela: Secondary | ICD-10-CM | POA: Diagnosis not present

## 2021-07-29 DIAGNOSIS — R2689 Other abnormalities of gait and mobility: Secondary | ICD-10-CM | POA: Diagnosis not present

## 2021-07-29 DIAGNOSIS — M25552 Pain in left hip: Secondary | ICD-10-CM | POA: Diagnosis not present

## 2021-07-29 DIAGNOSIS — M62552 Muscle wasting and atrophy, not elsewhere classified, left thigh: Secondary | ICD-10-CM | POA: Diagnosis not present

## 2021-07-29 DIAGNOSIS — Z4789 Encounter for other orthopedic aftercare: Secondary | ICD-10-CM | POA: Diagnosis not present

## 2021-07-29 DIAGNOSIS — R278 Other lack of coordination: Secondary | ICD-10-CM | POA: Diagnosis not present

## 2021-07-29 DIAGNOSIS — Z9181 History of falling: Secondary | ICD-10-CM | POA: Diagnosis not present

## 2021-07-29 MED ORDER — FAMOTIDINE 20 MG PO TABS: 20.0000 mg | ORAL_TABLET | Freq: Every day | ORAL | 0 refills | Status: DC

## 2021-07-30 DIAGNOSIS — R278 Other lack of coordination: Secondary | ICD-10-CM | POA: Diagnosis not present

## 2021-07-30 DIAGNOSIS — M6389 Disorders of muscle in diseases classified elsewhere, multiple sites: Secondary | ICD-10-CM | POA: Diagnosis not present

## 2021-07-30 DIAGNOSIS — S72142S Displaced intertrochanteric fracture of left femur, sequela: Secondary | ICD-10-CM | POA: Diagnosis not present

## 2021-07-30 DIAGNOSIS — Z9181 History of falling: Secondary | ICD-10-CM | POA: Diagnosis not present

## 2021-07-30 DIAGNOSIS — Z4789 Encounter for other orthopedic aftercare: Secondary | ICD-10-CM | POA: Diagnosis not present

## 2021-08-01 ENCOUNTER — Non-Acute Institutional Stay (SKILLED_NURSING_FACILITY): Payer: Medicare Other | Admitting: Internal Medicine

## 2021-08-01 ENCOUNTER — Encounter: Payer: Self-pay | Admitting: Internal Medicine

## 2021-08-01 DIAGNOSIS — M62552 Muscle wasting and atrophy, not elsewhere classified, left thigh: Secondary | ICD-10-CM | POA: Diagnosis not present

## 2021-08-01 DIAGNOSIS — S72142S Displaced intertrochanteric fracture of left femur, sequela: Secondary | ICD-10-CM | POA: Diagnosis not present

## 2021-08-01 DIAGNOSIS — M6389 Disorders of muscle in diseases classified elsewhere, multiple sites: Secondary | ICD-10-CM | POA: Diagnosis not present

## 2021-08-01 DIAGNOSIS — C9110 Chronic lymphocytic leukemia of B-cell type not having achieved remission: Secondary | ICD-10-CM | POA: Diagnosis not present

## 2021-08-01 DIAGNOSIS — F064 Anxiety disorder due to known physiological condition: Secondary | ICD-10-CM | POA: Diagnosis not present

## 2021-08-01 DIAGNOSIS — K219 Gastro-esophageal reflux disease without esophagitis: Secondary | ICD-10-CM | POA: Diagnosis not present

## 2021-08-01 DIAGNOSIS — M25552 Pain in left hip: Secondary | ICD-10-CM | POA: Diagnosis not present

## 2021-08-01 DIAGNOSIS — E785 Hyperlipidemia, unspecified: Secondary | ICD-10-CM | POA: Diagnosis not present

## 2021-08-01 DIAGNOSIS — D649 Anemia, unspecified: Secondary | ICD-10-CM | POA: Diagnosis not present

## 2021-08-01 DIAGNOSIS — Z4789 Encounter for other orthopedic aftercare: Secondary | ICD-10-CM | POA: Diagnosis not present

## 2021-08-01 DIAGNOSIS — S72002D Fracture of unspecified part of neck of left femur, subsequent encounter for closed fracture with routine healing: Secondary | ICD-10-CM | POA: Diagnosis not present

## 2021-08-01 DIAGNOSIS — R278 Other lack of coordination: Secondary | ICD-10-CM | POA: Diagnosis not present

## 2021-08-01 DIAGNOSIS — R2689 Other abnormalities of gait and mobility: Secondary | ICD-10-CM | POA: Diagnosis not present

## 2021-08-01 DIAGNOSIS — Z9181 History of falling: Secondary | ICD-10-CM | POA: Diagnosis not present

## 2021-08-01 NOTE — Progress Notes (Signed)
Location:   [facility] Room Number: 155 Place of Service:  SNF [phone]) Provider:  Veleta Miners MD   [name], MD  Patient Care Team: [name], MD as PCP - General (Internal Medicine) Belva Crome, MD as PCP - Cardiology (Cardiology)  Extended Emergency Contact Information Primary Emergency Contact: [name] F Address: [address]          [place], [place] [address] [name] Phone: [phone] Mobile Phone: [phone] Relation: Spouse  Code Status:  DNR Goals of care: Advanced Directive information Advanced Directives 08/01/2021  Does Patient Have a Medical Advance Directive? Yes  Type of Paramedic of [place];Living will;Out of facility DNR (pink MOST or yellow form)  Does patient want to make changes to medical advance directive? No - Patient declined  Copy of [place] in Chart? Yes - validated most recent copy scanned in chart (See row information)  Would patient like information on creating a medical advance directive? -  Pre-existing out of facility DNR order (yellow form or pink MOST form) Yellow form placed in chart (order not valid for inpatient use)     Chief Complaint  Patient presents with   Acute Visit    Reflux     HPI:  Pt is a 87 y.o. female seen today for an acute visit for Reflux like symptoms  Patient was admitted in the hospital from 11/23-11/26 after sustaining left hip fracture s/p IM nail . Patient has a history of CLL, GERD, HLD, stage III CKD, diastolic CHF and CAD   She fell in her apartment while setting the table. Was found to have a left intertrochanteric hip fracture.  Underwent IM nail placement on 74/25ZD Postop was complicated by mild  delirium.Also has Anemia Now she is in SNF.  Continues to do well with therapy Walking with Mild assist Pain is controlled Gets very anxious Also having Gastritis like symptoms  especially at Night Wants something Stronger then [place] with her husband in [place] with her cane before the fall Mild Cognitive impairment per husband but independent in her ADLS    Past Medical History:  Diagnosis Date   Abnormal nuclear stress test 01/26/2009   POST EXERCISE PVC's NOTED SINCE 2003   CLL (chronic lymphocytic leukemia) ([place]) 10/2009   DR. KALE   Colon polyp    HYPERPLASTIC   Diverticulosis    DJD (degenerative joint disease)    GERD (gastroesophageal reflux disease)    Hearing loss    DR. SHOEMAKER   Hypercholesterolemia    Hyperlipidemia    Iron deficiency anemia 05/2010   Lesion of breast    LEFT, SMALL   Microscopic hematuria    DR. PETERSON   Neuropathy    PERIPHERAL   Osteopenia    Renal cyst 10/2009   RENAL CYST ON CT 11/2009   Renal disease    STAGE 3   Tendon cysts    EXTENSOR 2MM RIGHT FIRST FINGER   Vitamin D deficiency 10/2011   Past Surgical History:  Procedure Laterality Date   INTRAMEDULLARY (IM) NAIL INTERTROCHANTERIC Left 07/07/2021   Procedure: INTRAMEDULLARY (IM) NAIL INTERTROCHANTRIC;  Surgeon: Paralee Cancel, MD;  Location: [facility];  Service: Orthopedics;  Laterality: Left;   LEFT HEART CATHETERIZATION WITH CORONARY ANGIOGRAM N/A 12/25/2011   Procedure: LEFT HEART CATHETERIZATION WITH CORONARY ANGIOGRAM;  Surgeon: Sinclair Grooms, MD;  Location: [facility] CATH LAB;  Service: Cardiovascular;  Laterality:  N/A;    Allergies  Allergen Reactions   Amoxicillin Other (See Comments)    HEADACHES Other reaction(s): Headache   Macrolides And Ketolides     Other reaction(s): Unknown   Metoprolol     Other reaction(s): hypotension   Ciprofloxacin Nausea Only and Rash    And tingling lips and itching   Macrodantin [Nitrofurantoin] Rash   Sulfa Antibiotics Nausea Only and Rash    And tingling lips and itching Other reaction(s): Unknown    Allergies as of 08/01/2021       Reactions   Amoxicillin Other (See Comments)    HEADACHES Other reaction(s): Headache   Macrolides And Ketolides    Other reaction(s): Unknown   Metoprolol    Other reaction(s): hypotension   Ciprofloxacin Nausea Only, Rash   And tingling lips and itching   Macrodantin [nitrofurantoin] Rash   Sulfa Antibiotics Nausea Only, Rash   And tingling lips and itching Other reaction(s): Unknown        Medication List        Accurate as of August 01, 2021  3:05 PM. If you have any questions, ask your nurse or doctor.          acetaminophen 650 MG CR tablet Commonly known as: TYLENOL Take 650 mg by mouth 2 (two) times daily.   ALIGN PO Take 1 capsule by mouth as needed.   ALPRAZolam 0.25 MG tablet Commonly known as: XANAX Take 0.25 mg by mouth every 6 (six) hours as needed for anxiety.   aspirin 81 MG tablet Take 81 mg by mouth daily.   CO Q 10 PO Take 1 tablet by mouth daily.   docusate sodium 100 MG capsule Commonly known as: COLACE Take 100 mg by mouth 2 (two) times daily. Hold for loose stools   enoxaparin 40 MG/0.4ML injection Commonly known as: LOVENOX Inject 0.4 mLs (40 mg total) into the skin daily.   famotidine 20 MG tablet Commonly known as: Pepcid Take 1 tablet (20 mg total) by mouth daily.   ferrous sulfate 325 (65 FE) MG tablet Take 325 mg by mouth 3 (three) times a week.   HYDROcodone-acetaminophen 5-325 MG tablet Commonly known as: NORCO/VICODIN Take 2 tablets by mouth every 6 (six) hours as needed for moderate pain.   HYDROcodone-acetaminophen 5-325 MG tablet Commonly known as: NORCO/VICODIN Take 1 tablet by mouth every 6 (six) hours as needed for moderate pain.   HYDROcodone-acetaminophen 5-325 MG tablet Commonly known as: NORCO/VICODIN Take 1 tablet by mouth 2 (two) times daily.   methocarbamol 500 MG tablet Commonly known as: ROBAXIN Take 250 mg by mouth every 4 (four) hours as needed for muscle spasms.   MOVE FREE ULTRA JOINT HEALTH PO Take 1 tablet by mouth daily.    multivitamin with minerals Tabs tablet Take 1 tablet by mouth daily.   Nitrostat 0.4 MG SL tablet Generic drug: nitroGLYCERIN DISSOLVE ONE TABLET UNDERTONGUE AS DIRECTED AS    NEEDED FOR CHEST PAIN   polyethylene glycol 17 g packet Commonly known as: MIRALAX / GLYCOLAX Take 17 g by mouth daily.   rosuvastatin 5 MG tablet Commonly known as: CRESTOR Take 5 mg by mouth at bedtime.   sertraline 25 MG tablet Commonly known as: ZOLOFT Take 25 mg by mouth daily.   traMADol 50 MG tablet Commonly known as: ULTRAM Take 1 tablet (50 mg total) by mouth daily as needed for moderate pain.   ULTRA FREEDA PO Take 1 tablet by mouth every morning.  Review of Systems  Constitutional:  Negative for activity change and appetite change.  HENT: Negative.    Respiratory:  Negative for cough and shortness of breath.   Cardiovascular:  Negative for leg swelling.  Gastrointestinal:  Negative for constipation.  Genitourinary: Negative.   Musculoskeletal:  Positive for arthralgias, gait problem and myalgias.  Skin: Negative.   Neurological:  Negative for dizziness and weakness.  Psychiatric/Behavioral:  Positive for confusion. Negative for dysphoric mood and sleep disturbance. The patient is nervous/anxious.    Immunization History  Administered Date(s) Administered   Influenza Split 07/05/2011, 05/30/2012, 05/28/2013, 06/09/2015, 06/16/2016   Influenza, High Dose Seasonal PF 05/04/2014, 05/04/2014, 05/28/2017, 04/30/2018, 06/22/2020   Influenza-Unspecified 05/14/2014, 06/03/2021   Moderna Sars-Covid-2 Vaccination 08/27/2019, 09/24/2019, 05/27/2021   PFIZER(Purple Top)SARS-COV-2 Vaccination 04/29/2020   Pneumococcal Conjugate-13 10/28/2013   Pneumococcal Polysaccharide-23 05/23/2005, 12/28/2017   Td 04/04/2005, 04/04/2005   Tdap 05/07/2015   Zoster, Live 11/27/2011   Pertinent  Health Maintenance Due  Topic Date Due   DEXA SCAN  Never done   INFLUENZA VACCINE  Completed   Fall  Risk 07/07/2021 07/07/2021 07/08/2021 07/08/2021 07/09/2021  Patient Fall Risk Level High fall risk High fall risk High fall risk High fall risk High fall risk   Functional Status Survey:    Vitals:   08/01/21 1450  BP: 130/67  Pulse: 60  Resp: 18  Temp: 98.8 F (37.1 C)  SpO2: 96%  Weight: 149 lb 12.8 oz (67.9 kg)  Height: 5' 4.3" (1.633 m)   Body mass index is 25.47 kg/m. Physical Exam Vitals reviewed.  Constitutional:      Appearance: Normal appearance.  HENT:     Head: Normocephalic.     Nose: Nose normal.     Mouth/Throat:     Mouth: Mucous membranes are moist.     Pharynx: Oropharynx is clear.  Eyes:     Pupils: Pupils are equal, round, and reactive to light.  Cardiovascular:     Rate and Rhythm: Normal rate and regular rhythm.     Pulses: Normal pulses.     Heart sounds: Normal heart sounds. No murmur heard. Pulmonary:     Effort: Pulmonary effort is normal.     Breath sounds: Normal breath sounds.  Abdominal:     General: Abdomen is flat. Bowel sounds are normal.     Palpations: Abdomen is soft.  Musculoskeletal:        General: No swelling.     Cervical back: Neck supple.  Skin:    General: Skin is warm.  Neurological:     General: No focal deficit present.     Mental Status: She is alert and oriented to person, place, and time.  Psychiatric:        Mood and Affect: Mood normal.        Thought Content: Thought content normal.    Labs reviewed: Recent Labs    07/06/21 2335 07/07/21 0435 07/08/21 0514 07/09/21 0542 07/18/21 0000  NA 136 137 137 136 142  K 4.3 4.4 3.9 3.5 4.9  CL 105 109 108 106 104  CO2 25 22 23 23 22   GLUCOSE 115* 113* 114* 130*  --   BUN 25* 21 13 19 13   CREATININE 0.95 0.87 0.74 0.81 0.8  CALCIUM 9.4 9.0 9.1 9.3 10.0  MG 2.1  --   --   --   --   PHOS 2.5  --   --   --   --    Recent Labs  10/19/20 1436 04/20/21 1401 07/06/21 2335 07/18/21 0000  AST 21 17 21 21   ALT 15 10 15  32  ALKPHOS 77 72 61 140*   BILITOT 0.5 0.5 0.8  --   PROT 6.8 6.8 6.3*  --   ALBUMIN 4.2 4.1 3.9 3.7   Recent Labs    04/20/21 1401 07/06/21 2335 07/07/21 0435 07/08/21 0514 07/09/21 0542 07/18/21 0000  WBC 17.2* 18.2* 17.3* 16.9* 22.5* 19.9  NEUTROABS 4.1 11.3* 11.8*  --   --   --   HGB 11.6* 10.9* 10.3* 9.5* 8.9* 8.8*  HCT 34.3* 32.6* 31.2* 29.8* 26.6* 28*  MCV 92.2 94.5 94.3 97.7 95.0  --   PLT 297 260 247 208 215 539*   Lab Results  Component Value Date   TSH 4.845 (H) 07/07/2021   No results found for: HGBA1C No results found for: CHOL, HDL, LDLCALC, LDLDIRECT, TRIG, CHOLHDL  Significant Diagnostic Results in last 30 days:  DG Chest 1 View  Result Date: 07/06/2021 CLINICAL DATA:  Fall with hip fracture EXAM: CHEST  1 VIEW COMPARISON:  02/26/2020 FINDINGS: No focal opacity or pleural effusion. Stable cardiomediastinal silhouette with aortic atherosclerosis. No pneumothorax. IMPRESSION: No active disease. Electronically Signed   By: Donavan Foil M.D.   On: 07/06/2021 23:32   DG Knee Complete 4 Views Left  Result Date: 07/06/2021 CLINICAL DATA:  Recent fall with knee pain, initial encounter EXAM: LEFT KNEE - COMPLETE 4+ VIEW COMPARISON:  None. FINDINGS: Degenerative changes of the left knee joint are seen. No acute fracture or dislocation is seen. Patella is within normal limits. No joint effusion is noted. IMPRESSION: Tricompartmental degenerative changes without acute abnormality. Electronically Signed   By: Inez Catalina M.D.   On: 07/06/2021 23:41   DG C-Arm 1-60 Min-No Report  Result Date: 07/07/2021 CLINICAL DATA:  87 year old female with left hip fracture EXAM: OPERATIVE LEFT HIP (WITH PELVIS IF PERFORMED) 3 VIEWS TECHNIQUE: Fluoroscopic spot image(s) were submitted for interpretation post-operatively. COMPARISON:  07/06/2021 FINDINGS: Limited intraoperative fluoroscopic spot images of open reduction internal fixation left hip fracture, with relative alignment maintained. IMPRESSION:  Intraoperative fluoroscopic spot images of left hip ORIF. Please refer to the dictated operative report for full details of intraoperative findings and procedure. Electronically Signed   By: Corrie Mckusick D.O.   On: 07/07/2021 13:36   ECHOCARDIOGRAM COMPLETE  Result Date: 07/08/2021    ECHOCARDIOGRAM REPORT   Patient Name:   RACHELANN ENLOE Date of Exam: 07/08/2021 Medical Rec #:  [id number]      Height:       63.0 in Accession #:    [id number]     Weight:       163.8 lb Date of Birth:  23-Jan-1926      BSA:          1.776 m Patient Age:    45 years       BP:           144/62 mmHg Patient Gender: F              HR:           92 bpm. Exam Location:  Inpatient Procedure: 2D Echo, Color Doppler and Cardiac Doppler Indications:    Aortic aneurysm [facility]) [[id number]]  History:        Patient has prior history of Echocardiogram examinations, most                 recent 08/02/2017. CHF; Risk Factors:Hypertension and  Dyslipidemia. Chronic kidney disease, anemia. Left hip fracture.  Sonographer:    Darlina Sicilian RDCS Referring Phys: [place]  1. Left ventricular ejection fraction, by estimation, is 60 to 65%. The left ventricle has normal function. The left ventricle has no regional wall motion abnormalities. Left ventricular diastolic parameters are consistent with Grade II diastolic dysfunction (pseudonormalization). Elevated left ventricular end-diastolic pressure.  2. Right ventricular systolic function is normal. The right ventricular size is normal. There is normal pulmonary artery systolic pressure.  3. The mitral valve is abnormal. Trivial mitral valve regurgitation. No evidence of mitral stenosis.  4. The aortic valve is tricuspid. There is moderate calcification of the aortic valve. Aortic valve regurgitation is trivial. Mild aortic valve stenosis.  5. Aortic dilatation noted. There is mild dilatation of the ascending aorta, measuring 42 mm.  6. The inferior vena cava is  normal in size with greater than 50% respiratory variability, suggesting right atrial pressure of 3 mmHg. FINDINGS  Left Ventricle: Left ventricular ejection fraction, by estimation, is 60 to 65%. The left ventricle has normal function. The left ventricle has no regional wall motion abnormalities. The left ventricular internal cavity size was normal in size. There is  no left ventricular hypertrophy. Left ventricular diastolic parameters are consistent with Grade II diastolic dysfunction (pseudonormalization). Elevated left ventricular end-diastolic pressure. Right Ventricle: The right ventricular size is normal. No increase in right ventricular wall thickness. Right ventricular systolic function is normal. There is normal pulmonary artery systolic pressure. The tricuspid regurgitant velocity is 2.53 m/s, and  with an assumed right atrial pressure of 3 mmHg, the estimated right ventricular systolic pressure is 58.5 mmHg. Left Atrium: Left atrial size was normal in size. Right Atrium: Right atrial size was normal in size. Pericardium: There is no evidence of pericardial effusion. Mitral Valve: The mitral valve is abnormal. There is mild thickening of the mitral valve leaflet(s). There is mild calcification of the mitral valve leaflet(s). Trivial mitral valve regurgitation. No evidence of mitral valve stenosis. Tricuspid Valve: The tricuspid valve is normal in structure. Tricuspid valve regurgitation is mild . No evidence of tricuspid stenosis. Aortic Valve: The aortic valve is tricuspid. There is moderate calcification of the aortic valve. Aortic valve regurgitation is trivial. Mild aortic stenosis is present. Aortic valve mean gradient measures 8.5 mmHg. Aortic valve peak gradient measures 17.0 mmHg. Aortic valve area, by VTI measures 1.60 cm. Pulmonic Valve: The pulmonic valve was normal in structure. Pulmonic valve regurgitation is not visualized. No evidence of pulmonic stenosis. Aorta: The aortic root is normal  in size and structure and aortic dilatation noted. There is mild dilatation of the ascending aorta, measuring 42 mm. Venous: The inferior vena cava is normal in size with greater than 50% respiratory variability, suggesting right atrial pressure of 3 mmHg. IAS/Shunts: No atrial level shunt detected by color flow Doppler.  LEFT VENTRICLE PLAX 2D LVIDd:         4.10 cm   Diastology LVIDs:         2.20 cm   LV e' medial:    7.54 cm/s LV PW:         1.00 cm   LV E/e' medial:  11.9 LV IVS:        1.00 cm   LV e' lateral:   4.58 cm/s LVOT diam:     2.00 cm   LV E/e' lateral: 19.7 LV SV:         62 LV SV Index:  35 LVOT Area:     3.14 cm  RIGHT VENTRICLE RV S prime:     17.60 cm/s TAPSE (M-mode): 2.2 cm LEFT ATRIUM             Index LA diam:        2.80 cm 1.58 cm/m LA Vol (A2C):   53.5 ml 30.12 ml/m LA Vol (A4C):   53.9 ml 30.34 ml/m LA Biplane Vol: 57.6 ml 32.43 ml/m  AORTIC VALVE AV Area (Vmax):    1.49 cm AV Area (Vmean):   1.43 cm AV Area (VTI):     1.60 cm AV Vmax:           206.00 cm/s AV Vmean:          132.500 cm/s AV VTI:            0.384 m AV Peak Grad:      17.0 mmHg AV Mean Grad:      8.5 mmHg LVOT Vmax:         97.90 cm/s LVOT Vmean:        60.400 cm/s LVOT VTI:          0.196 m LVOT/AV VTI ratio: 0.51  AORTA Ao Root diam: 3.50 cm Ao STJ diam:  3.1 cm Ao Asc diam:  4.20 cm MITRAL VALVE                TRICUSPID VALVE MV Area (PHT): 4.71 cm     TR Peak grad:   25.6 mmHg MV Decel Time: 161 msec     TR Vmax:        253.00 cm/s MV E velocity: 90.00 cm/s MV A velocity: 105.00 cm/s  SHUNTS MV E/A ratio:  0.86         Systemic VTI:  0.20 m                             Systemic Diam: 2.00 cm Jenkins Rouge MD Electronically signed by Jenkins Rouge MD Signature Date/Time: 07/08/2021/10:18:10 AM    Final    DG HIP OPERATIVE UNILAT W OR W/O PELVIS LEFT  Result Date: 07/07/2021 CLINICAL DATA:  87 year old female with left hip fracture EXAM: OPERATIVE LEFT HIP (WITH PELVIS IF PERFORMED) 3 VIEWS TECHNIQUE:  Fluoroscopic spot image(s) were submitted for interpretation post-operatively. COMPARISON:  07/06/2021 FINDINGS: Limited intraoperative fluoroscopic spot images of open reduction internal fixation left hip fracture, with relative alignment maintained. IMPRESSION: Intraoperative fluoroscopic spot images of left hip ORIF. Please refer to the dictated operative report for full details of intraoperative findings and procedure. Electronically Signed   By: Corrie Mckusick D.O.   On: 07/07/2021 13:36   DG Hip Unilat With Pelvis 2-3 Views Left  Result Date: 07/06/2021 CLINICAL DATA:  Fall hip pain EXAM: DG HIP (WITH OR WITHOUT PELVIS) 2-3V LEFT COMPARISON:  None. FINDINGS: SI joints are non widened. Pubic symphysis and rami appear intact. Acute nondisplaced intertrochanteric fracture on the left. No femoral head dislocation IMPRESSION: Acute left intertrochanteric fracture Electronically Signed   By: Donavan Foil M.D.   On: 07/06/2021 23:31    Assessment/Plan Gastroesophageal reflux disease without esophagitis Start on Prilosec at night Continue Pepcid Closed fracture of left hip with routine healing, subsequent encounter Pain Controlled oN Norco Doing well woith therapy  Anxiety disorder due to medical condition Continue PRN xanax Hyperlipidemia, unspecified hyperlipidemia type On Crestor CLL (chronic lymphocytic leukemia) (HCC)  Anemia, unspecified type On iron Will  repeat CBC Mild cognitive disorder MMSE was 24/30 Faled her Clock drawing Also did not get the year  2/3 in recall  Family/ staff Communication:   Labs/tests ordered:   CBC<CMP

## 2021-08-02 DIAGNOSIS — Z9181 History of falling: Secondary | ICD-10-CM | POA: Diagnosis not present

## 2021-08-02 DIAGNOSIS — S72142S Displaced intertrochanteric fracture of left femur, sequela: Secondary | ICD-10-CM | POA: Diagnosis not present

## 2021-08-02 DIAGNOSIS — M62552 Muscle wasting and atrophy, not elsewhere classified, left thigh: Secondary | ICD-10-CM | POA: Diagnosis not present

## 2021-08-02 DIAGNOSIS — M6389 Disorders of muscle in diseases classified elsewhere, multiple sites: Secondary | ICD-10-CM | POA: Diagnosis not present

## 2021-08-02 DIAGNOSIS — M25552 Pain in left hip: Secondary | ICD-10-CM | POA: Diagnosis not present

## 2021-08-02 DIAGNOSIS — R2689 Other abnormalities of gait and mobility: Secondary | ICD-10-CM | POA: Diagnosis not present

## 2021-08-02 DIAGNOSIS — Z4789 Encounter for other orthopedic aftercare: Secondary | ICD-10-CM | POA: Diagnosis not present

## 2021-08-02 DIAGNOSIS — R278 Other lack of coordination: Secondary | ICD-10-CM | POA: Diagnosis not present

## 2021-08-02 DIAGNOSIS — D649 Anemia, unspecified: Secondary | ICD-10-CM | POA: Diagnosis not present

## 2021-08-02 LAB — COMPREHENSIVE METABOLIC PANEL
Albumin: 4.2 (ref 3.5–5.0)
Calcium: 10.1 (ref 8.7–10.7)
Globulin: 1.8

## 2021-08-02 LAB — CBC AND DIFFERENTIAL
HCT: 32 — AB (ref 36–46)
Hemoglobin: 10.2 — AB (ref 12.0–16.0)
Platelets: 318 (ref 150–399)
WBC: 18.1

## 2021-08-02 LAB — BASIC METABOLIC PANEL
BUN: 13 (ref 4–21)
CO2: 27 — AB (ref 13–22)
Chloride: 99 (ref 99–108)
Creatinine: 0.8 (ref 0.5–1.1)
Glucose: 124
Potassium: 4.4 (ref 3.4–5.3)
Sodium: 136 — AB (ref 137–147)

## 2021-08-02 LAB — HEPATIC FUNCTION PANEL
ALT: 19 (ref 7–35)
AST: 21 (ref 13–35)
Alkaline Phosphatase: 168 — AB (ref 25–125)
Bilirubin, Total: 0.3

## 2021-08-02 LAB — CBC: RBC: 3.32 — AB (ref 3.87–5.11)

## 2021-08-03 DIAGNOSIS — R278 Other lack of coordination: Secondary | ICD-10-CM | POA: Diagnosis not present

## 2021-08-03 DIAGNOSIS — Z4789 Encounter for other orthopedic aftercare: Secondary | ICD-10-CM | POA: Diagnosis not present

## 2021-08-03 DIAGNOSIS — Z9181 History of falling: Secondary | ICD-10-CM | POA: Diagnosis not present

## 2021-08-03 DIAGNOSIS — M25552 Pain in left hip: Secondary | ICD-10-CM | POA: Diagnosis not present

## 2021-08-03 DIAGNOSIS — M62552 Muscle wasting and atrophy, not elsewhere classified, left thigh: Secondary | ICD-10-CM | POA: Diagnosis not present

## 2021-08-03 DIAGNOSIS — S72142S Displaced intertrochanteric fracture of left femur, sequela: Secondary | ICD-10-CM | POA: Diagnosis not present

## 2021-08-03 DIAGNOSIS — M6389 Disorders of muscle in diseases classified elsewhere, multiple sites: Secondary | ICD-10-CM | POA: Diagnosis not present

## 2021-08-03 DIAGNOSIS — R2689 Other abnormalities of gait and mobility: Secondary | ICD-10-CM | POA: Diagnosis not present

## 2021-08-04 ENCOUNTER — Non-Acute Institutional Stay (SKILLED_NURSING_FACILITY): Payer: Medicare Other | Admitting: Internal Medicine

## 2021-08-04 ENCOUNTER — Encounter: Payer: Self-pay | Admitting: Internal Medicine

## 2021-08-04 DIAGNOSIS — E785 Hyperlipidemia, unspecified: Secondary | ICD-10-CM

## 2021-08-04 DIAGNOSIS — C9110 Chronic lymphocytic leukemia of B-cell type not having achieved remission: Secondary | ICD-10-CM

## 2021-08-04 DIAGNOSIS — Z9181 History of falling: Secondary | ICD-10-CM | POA: Diagnosis not present

## 2021-08-04 DIAGNOSIS — K219 Gastro-esophageal reflux disease without esophagitis: Secondary | ICD-10-CM | POA: Diagnosis not present

## 2021-08-04 DIAGNOSIS — R1032 Left lower quadrant pain: Secondary | ICD-10-CM | POA: Diagnosis not present

## 2021-08-04 DIAGNOSIS — M25552 Pain in left hip: Secondary | ICD-10-CM | POA: Diagnosis not present

## 2021-08-04 DIAGNOSIS — N1831 Chronic kidney disease, stage 3a: Secondary | ICD-10-CM

## 2021-08-04 DIAGNOSIS — Z4789 Encounter for other orthopedic aftercare: Secondary | ICD-10-CM | POA: Diagnosis not present

## 2021-08-04 DIAGNOSIS — R1030 Lower abdominal pain, unspecified: Secondary | ICD-10-CM | POA: Diagnosis not present

## 2021-08-04 DIAGNOSIS — R109 Unspecified abdominal pain: Secondary | ICD-10-CM | POA: Diagnosis not present

## 2021-08-04 DIAGNOSIS — S72142S Displaced intertrochanteric fracture of left femur, sequela: Secondary | ICD-10-CM | POA: Diagnosis not present

## 2021-08-04 DIAGNOSIS — F09 Unspecified mental disorder due to known physiological condition: Secondary | ICD-10-CM | POA: Diagnosis not present

## 2021-08-04 DIAGNOSIS — D649 Anemia, unspecified: Secondary | ICD-10-CM | POA: Diagnosis not present

## 2021-08-04 DIAGNOSIS — F064 Anxiety disorder due to known physiological condition: Secondary | ICD-10-CM | POA: Diagnosis not present

## 2021-08-04 DIAGNOSIS — R1031 Right lower quadrant pain: Secondary | ICD-10-CM | POA: Diagnosis not present

## 2021-08-04 DIAGNOSIS — S72002D Fracture of unspecified part of neck of left femur, subsequent encounter for closed fracture with routine healing: Secondary | ICD-10-CM

## 2021-08-04 DIAGNOSIS — R278 Other lack of coordination: Secondary | ICD-10-CM | POA: Diagnosis not present

## 2021-08-04 DIAGNOSIS — R2689 Other abnormalities of gait and mobility: Secondary | ICD-10-CM | POA: Diagnosis not present

## 2021-08-04 DIAGNOSIS — M62552 Muscle wasting and atrophy, not elsewhere classified, left thigh: Secondary | ICD-10-CM | POA: Diagnosis not present

## 2021-08-04 DIAGNOSIS — M6389 Disorders of muscle in diseases classified elsewhere, multiple sites: Secondary | ICD-10-CM | POA: Diagnosis not present

## 2021-08-04 NOTE — Progress Notes (Signed)
Location:   [facility] Room Number: 155 Place of Service:  SNF [phone]) Provider:  Veleta Miners MD   [name], MD  Patient Care Team: [name], MD as PCP - General (Internal Medicine) Belva Crome, MD as PCP - Cardiology (Cardiology)  Extended Emergency Contact Information Primary Emergency Contact: [name] F Address: [address]          [place], [place] [address] [name] of [place] Phone: [phone] Mobile Phone: [phone] Relation: Spouse  Code Status:  DNR Goals of care: Advanced Directive information Advanced Directives 08/04/2021  Does Patient Have a Medical Advance Directive? Yes  Type of Paramedic of [place];Living will;Out of facility DNR (pink MOST or yellow form)  Does patient want to make changes to medical advance directive? No - Patient declined  Copy of [place] in Chart? Yes - validated most recent copy scanned in chart (See row information)  Would patient like information on creating a medical advance directive? -  Pre-existing out of facility DNR order (yellow form or pink MOST form) Yellow form placed in chart (order not valid for inpatient use)     Chief Complaint  Patient presents with   Acute Visit    HPI:  Pt is a 87 y.o. female seen today for an acute visit for Discharge planning. And Lower Abdominal Pain  Patient was admitted in the hospital from 11/23-11/26 after sustaining left hip fracture s/p IM nail . Patient has a history of CLL, GERD, HLD, stage III CKD, diastolic CHF and CAD   She fell in her apartment while setting the table. Was found to have a left intertrochanteric hip fracture.  Underwent IM nail placement on 81/44YJ Postop was complicated by mild  delirium.Also has Anemia Now she is in SNF.  Doing well. Therapy thinks she is safe to return home Her only issue was Reflux like symptoms which  are now Better on  Prilosec BID She had some Lower Abdominal Discomfort and we checked her urine which is negative for any infection Also Abdominal Xray is pending to rule out Constipation  Husband feels that they will stay in the Rehab for over the Holiday weekend and the go home with the help of Therapy Patient is walking with her walker. Eating well. Bowels moving well. Pain is controlled  Past Medical History:  Diagnosis Date   Abnormal nuclear stress test 01/26/2009   POST EXERCISE PVC's NOTED SINCE 2003   CLL (chronic lymphocytic leukemia) ([place]) 10/2009   DR. KALE   Colon polyp    HYPERPLASTIC   Diverticulosis    DJD (degenerative joint disease)    GERD (gastroesophageal reflux disease)    Hearing loss    DR. SHOEMAKER   Hypercholesterolemia    Hyperlipidemia    Iron deficiency anemia 05/2010   Lesion of breast    LEFT, SMALL   Microscopic hematuria    DR. PETERSON   Neuropathy    PERIPHERAL   Osteopenia    Renal cyst 10/2009   RENAL CYST ON CT 11/2009   Renal disease    STAGE 3   Tendon cysts    EXTENSOR 2MM RIGHT FIRST FINGER   Vitamin D deficiency 10/2011   Past Surgical History:  Procedure Laterality Date   INTRAMEDULLARY (IM) NAIL INTERTROCHANTERIC Left 07/07/2021   Procedure: INTRAMEDULLARY (IM) NAIL INTERTROCHANTRIC;  Surgeon: Paralee Cancel, MD;  Location: [facility];  Service: Orthopedics;  Laterality: Left;   LEFT HEART CATHETERIZATION  WITH CORONARY ANGIOGRAM N/A 12/25/2011   Procedure: LEFT HEART CATHETERIZATION WITH CORONARY ANGIOGRAM;  Surgeon: Sinclair Grooms, MD;  Location: [facility] CATH LAB;  Service: Cardiovascular;  Laterality: N/A;    Allergies  Allergen Reactions   Amoxicillin Other (See Comments)    HEADACHES Other reaction(s): Headache   Macrolides And Ketolides     Other reaction(s): Unknown   Metoprolol     Other reaction(s): hypotension   Ciprofloxacin Nausea Only and Rash    And tingling lips and itching   Macrodantin [Nitrofurantoin] Rash   Sulfa  Antibiotics Nausea Only and Rash    And tingling lips and itching Other reaction(s): Unknown    Allergies as of 08/04/2021       Reactions   Amoxicillin Other (See Comments)   HEADACHES Other reaction(s): Headache   Macrolides And Ketolides    Other reaction(s): Unknown   Metoprolol    Other reaction(s): hypotension   Ciprofloxacin Nausea Only, Rash   And tingling lips and itching   Macrodantin [nitrofurantoin] Rash   Sulfa Antibiotics Nausea Only, Rash   And tingling lips and itching Other reaction(s): Unknown        Medication List        Accurate as of August 04, 2021  4:18 PM. If you have any questions, ask your nurse or doctor.          STOP taking these medications    famotidine 20 MG tablet Commonly known as: Pepcid Stopped by: Virgie Dad, MD       TAKE these medications    acetaminophen 650 MG CR tablet Commonly known as: TYLENOL Take 650 mg by mouth 2 (two) times daily.   ALIGN PO Take 1 capsule by mouth as needed.   ALPRAZolam 0.25 MG tablet Commonly known as: XANAX Take 0.25 mg by mouth every 6 (six) hours as needed for anxiety.   aluminum-magnesium hydroxide-simethicone 409-811-91 MG/5ML Susp Commonly known as: MAALOX Take 30 mLs by mouth every 4 (four) hours as needed.   aspirin 81 MG tablet Take 81 mg by mouth daily.   CO Q 10 PO Take 1 tablet by mouth daily.   docusate sodium 100 MG capsule Commonly known as: COLACE Take 100 mg by mouth 2 (two) times daily. Hold for loose stools   enoxaparin 40 MG/0.4ML injection Commonly known as: LOVENOX Inject 0.4 mLs (40 mg total) into the skin daily.   ferrous sulfate 325 (65 FE) MG tablet Take 325 mg by mouth 3 (three) times a week.   HYDROcodone-acetaminophen 5-325 MG tablet Commonly known as: NORCO/VICODIN Take 2 tablets by mouth every 6 (six) hours as needed for moderate pain.   HYDROcodone-acetaminophen 5-325 MG tablet Commonly known as: NORCO/VICODIN Take 1 tablet by  mouth every 6 (six) hours as needed for moderate pain.   HYDROcodone-acetaminophen 5-325 MG tablet Commonly known as: NORCO/VICODIN Take 1 tablet by mouth 2 (two) times daily.   methocarbamol 500 MG tablet Commonly known as: ROBAXIN Take 250 mg by mouth every 4 (four) hours as needed for muscle spasms.   MOVE FREE ULTRA JOINT HEALTH PO Take 1 tablet by mouth daily.   multivitamin with minerals Tabs tablet Take 1 tablet by mouth daily.   Nitrostat 0.4 MG SL tablet Generic drug: nitroGLYCERIN DISSOLVE ONE TABLET UNDERTONGUE AS DIRECTED AS    NEEDED FOR CHEST PAIN   omeprazole 20 MG capsule Commonly known as: PRILOSEC Take 20 mg by mouth daily.   polyethylene glycol 17 g packet Commonly  known as: MIRALAX / GLYCOLAX Take 17 g by mouth daily.   rosuvastatin 5 MG tablet Commonly known as: CRESTOR Take 5 mg by mouth at bedtime.   sertraline 25 MG tablet Commonly known as: ZOLOFT Take 25 mg by mouth daily.   traMADol 50 MG tablet Commonly known as: ULTRAM Take 1 tablet (50 mg total) by mouth daily as needed for moderate pain.   ULTRA FREEDA PO Take 1 tablet by mouth every morning.        Review of Systems  Constitutional:  Negative for activity change and appetite change.  HENT: Negative.    Respiratory:  Negative for cough and shortness of breath.   Cardiovascular:  Negative for leg swelling.  Gastrointestinal:  Positive for abdominal distention. Negative for constipation.  Genitourinary: Negative.   Musculoskeletal:  Negative for arthralgias, gait problem and myalgias.  Skin: Negative.   Neurological:  Negative for dizziness and weakness.  Psychiatric/Behavioral:  Positive for confusion. Negative for dysphoric mood and sleep disturbance. The patient is nervous/anxious.    Immunization History  Administered Date(s) Administered   Influenza Split 07/05/2011, 05/30/2012, 05/28/2013, 06/09/2015, 06/16/2016   Influenza, High Dose Seasonal PF 05/04/2014, 05/04/2014,  05/28/2017, 04/30/2018, 06/22/2020   Influenza-Unspecified 05/14/2014, 06/03/2021   Moderna Sars-Covid-2 Vaccination 08/27/2019, 09/24/2019, 05/27/2021   PFIZER(Purple Top)SARS-COV-2 Vaccination 04/29/2020   Pneumococcal Conjugate-13 10/28/2013   Pneumococcal Polysaccharide-23 05/23/2005, 12/28/2017   Td 04/04/2005, 04/04/2005   Tdap 05/07/2015   Zoster, Live 11/27/2011   Pertinent  Health Maintenance Due  Topic Date Due   DEXA SCAN  Never done   INFLUENZA VACCINE  Completed   Fall Risk 07/07/2021 07/07/2021 07/08/2021 07/08/2021 07/09/2021  Patient Fall Risk Level High fall risk High fall risk High fall risk High fall risk High fall risk   Functional Status Survey:    Vitals:   08/04/21 1603  BP: (!) 142/83  Pulse: 65  Resp: 14  Temp: 98.8 F (37.1 C)  SpO2: 97%  Weight: 150 lb (68 kg)  Height: 5' 4.3" (1.633 m)   Body mass index is 25.51 kg/m. Physical Exam Vitals reviewed.  Constitutional:      Appearance: Normal appearance.  HENT:     Head: Normocephalic.     Nose: Nose normal.     Mouth/Throat:     Mouth: Mucous membranes are moist.     Pharynx: Oropharynx is clear.  Eyes:     Pupils: Pupils are equal, round, and reactive to light.  Cardiovascular:     Rate and Rhythm: Normal rate and regular rhythm.     Pulses: Normal pulses.     Heart sounds: Normal heart sounds. No murmur heard. Pulmonary:     Effort: Pulmonary effort is normal.     Breath sounds: Normal breath sounds.  Abdominal:     General: Abdomen is flat. Bowel sounds are normal.     Palpations: Abdomen is soft.  Musculoskeletal:        General: No swelling.     Cervical back: Neck supple.  Skin:    General: Skin is warm.  Neurological:     General: No focal deficit present.     Mental Status: She is alert and oriented to person, place, and time.  Psychiatric:        Mood and Affect: Mood normal.        Thought Content: Thought content normal.    Labs reviewed: Recent Labs     07/06/21 2335 07/07/21 0435 07/08/21 0514 07/09/21 0542 07/18/21 0000 08/02/21  0000  NA 136 137 137 136 142   142 136*  K 4.3 4.4 3.9 3.5 4.9   4.9 4.4  CL 105 109 108 106 104   104 99  CO2 25 22 23 23 22   22  27*  GLUCOSE 115* 113* 114* 130*  --   --   BUN 25* 21 13 19 13   13 13   CREATININE 0.95 0.87 0.74 0.81 0.8   0.8 0.8  CALCIUM 9.4 9.0 9.1 9.3 10.0   10.0 10.1  MG 2.1  --   --   --   --   --   PHOS 2.5  --   --   --   --   --    Recent Labs    10/19/20 1436 04/20/21 1401 07/06/21 2335 07/18/21 0000 08/02/21 0000  AST 21 17 21 21   21 21   ALT 15 10 15  32   32 19  ALKPHOS 77 72 61 140*   140* 168*  BILITOT 0.5 0.5 0.8  --   --   PROT 6.8 6.8 6.3*  --   --   ALBUMIN 4.2 4.1 3.9 3.7   3.7 4.2   Recent Labs    04/20/21 1401 07/06/21 2335 07/07/21 0435 07/08/21 0514 07/09/21 0542 07/18/21 0000 08/02/21 0000  WBC 17.2* 18.2* 17.3* 16.9* 22.5* 19.9   19.9 18.1  NEUTROABS 4.1 11.3* 11.8*  --   --   --   --   HGB 11.6* 10.9* 10.3* 9.5* 8.9* 8.8*   8.8* 10.2*  HCT 34.3* 32.6* 31.2* 29.8* 26.6* 28*   28* 32*  MCV 92.2 94.5 94.3 97.7 95.0  --   --   PLT 297 260 247 208 215 539*   539* 318   Lab Results  Component Value Date   TSH 4.845 (H) 07/07/2021   No results found for: HGBA1C No results found for: CHOL, HDL, LDLCALC, LDLDIRECT, TRIG, CHOLHDL  Significant Diagnostic Results in last 30 days:  DG Chest 1 View  Result Date: 07/06/2021 CLINICAL DATA:  Fall with hip fracture EXAM: CHEST  1 VIEW COMPARISON:  02/26/2020 FINDINGS: No focal opacity or pleural effusion. Stable cardiomediastinal silhouette with aortic atherosclerosis. No pneumothorax. IMPRESSION: No active disease. Electronically Signed   By: Donavan Foil M.D.   On: 07/06/2021 23:32   DG Knee Complete 4 Views Left  Result Date: 07/06/2021 CLINICAL DATA:  Recent fall with knee pain, initial encounter EXAM: LEFT KNEE - COMPLETE 4+ VIEW COMPARISON:  None. FINDINGS: Degenerative changes of the left  knee joint are seen. No acute fracture or dislocation is seen. Patella is within normal limits. No joint effusion is noted. IMPRESSION: Tricompartmental degenerative changes without acute abnormality. Electronically Signed   By: Inez Catalina M.D.   On: 07/06/2021 23:41   DG C-Arm 1-60 Min-No Report  Result Date: 07/07/2021 CLINICAL DATA:  87 year old female with left hip fracture EXAM: OPERATIVE LEFT HIP (WITH PELVIS IF PERFORMED) 3 VIEWS TECHNIQUE: Fluoroscopic spot image(s) were submitted for interpretation post-operatively. COMPARISON:  07/06/2021 FINDINGS: Limited intraoperative fluoroscopic spot images of open reduction internal fixation left hip fracture, with relative alignment maintained. IMPRESSION: Intraoperative fluoroscopic spot images of left hip ORIF. Please refer to the dictated operative report for full details of intraoperative findings and procedure. Electronically Signed   By: Corrie Mckusick D.O.   On: 07/07/2021 13:36   ECHOCARDIOGRAM COMPLETE  Result Date: 07/08/2021    ECHOCARDIOGRAM REPORT   Patient Name:   Rebecca KENDRA  King Date of Exam: 07/08/2021 Medical Rec #:  [id number]      Height:       63.0 in Accession #:    [id number]     Weight:       163.8 lb Date of Birth:  11/16/1925      BSA:          1.776 m Patient Age:    73 years       BP:           144/62 mmHg Patient Gender: F              HR:           92 bpm. Exam Location:  Inpatient Procedure: 2D Echo, Color Doppler and Cardiac Doppler Indications:    Aortic aneurysm [facility]) [[id number]]  History:        Patient has prior history of Echocardiogram examinations, most                 recent 08/02/2017. CHF; Risk Factors:Hypertension and                 Dyslipidemia. Chronic kidney disease, anemia. Left hip fracture.  Sonographer:    Darlina Sicilian RDCS Referring Phys: [place]  1. Left ventricular ejection fraction, by estimation, is 60 to 65%. The left ventricle has normal function. The left ventricle has no  regional wall motion abnormalities. Left ventricular diastolic parameters are consistent with Grade II diastolic dysfunction (pseudonormalization). Elevated left ventricular end-diastolic pressure.  2. Right ventricular systolic function is normal. The right ventricular size is normal. There is normal pulmonary artery systolic pressure.  3. The mitral valve is abnormal. Trivial mitral valve regurgitation. No evidence of mitral stenosis.  4. The aortic valve is tricuspid. There is moderate calcification of the aortic valve. Aortic valve regurgitation is trivial. Mild aortic valve stenosis.  5. Aortic dilatation noted. There is mild dilatation of the ascending aorta, measuring 42 mm.  6. The inferior vena cava is normal in size with greater than 50% respiratory variability, suggesting right atrial pressure of 3 mmHg. FINDINGS  Left Ventricle: Left ventricular ejection fraction, by estimation, is 60 to 65%. The left ventricle has normal function. The left ventricle has no regional wall motion abnormalities. The left ventricular internal cavity size was normal in size. There is  no left ventricular hypertrophy. Left ventricular diastolic parameters are consistent with Grade II diastolic dysfunction (pseudonormalization). Elevated left ventricular end-diastolic pressure. Right Ventricle: The right ventricular size is normal. No increase in right ventricular wall thickness. Right ventricular systolic function is normal. There is normal pulmonary artery systolic pressure. The tricuspid regurgitant velocity is 2.53 m/s, and  with an assumed right atrial pressure of 3 mmHg, the estimated right ventricular systolic pressure is 10.9 mmHg. Left Atrium: Left atrial size was normal in size. Right Atrium: Right atrial size was normal in size. Pericardium: There is no evidence of pericardial effusion. Mitral Valve: The mitral valve is abnormal. There is mild thickening of the mitral valve leaflet(s). There is mild calcification of  the mitral valve leaflet(s). Trivial mitral valve regurgitation. No evidence of mitral valve stenosis. Tricuspid Valve: The tricuspid valve is normal in structure. Tricuspid valve regurgitation is mild . No evidence of tricuspid stenosis. Aortic Valve: The aortic valve is tricuspid. There is moderate calcification of the aortic valve. Aortic valve regurgitation is trivial. Mild aortic stenosis is present. Aortic valve mean gradient measures 8.5 mmHg. Aortic valve peak gradient measures 17.0  mmHg. Aortic valve area, by VTI measures 1.60 cm. Pulmonic Valve: The pulmonic valve was normal in structure. Pulmonic valve regurgitation is not visualized. No evidence of pulmonic stenosis. Aorta: The aortic root is normal in size and structure and aortic dilatation noted. There is mild dilatation of the ascending aorta, measuring 42 mm. Venous: The inferior vena cava is normal in size with greater than 50% respiratory variability, suggesting right atrial pressure of 3 mmHg. IAS/Shunts: No atrial level shunt detected by color flow Doppler.  LEFT VENTRICLE PLAX 2D LVIDd:         4.10 cm   Diastology LVIDs:         2.20 cm   LV e' medial:    7.54 cm/s LV PW:         1.00 cm   LV E/e' medial:  11.9 LV IVS:        1.00 cm   LV e' lateral:   4.58 cm/s LVOT diam:     2.00 cm   LV E/e' lateral: 19.7 LV SV:         62 LV SV Index:   35 LVOT Area:     3.14 cm  RIGHT VENTRICLE RV S prime:     17.60 cm/s TAPSE (M-mode): 2.2 cm LEFT ATRIUM             Index LA diam:        2.80 cm 1.58 cm/m LA Vol (A2C):   53.5 ml 30.12 ml/m LA Vol (A4C):   53.9 ml 30.34 ml/m LA Biplane Vol: 57.6 ml 32.43 ml/m  AORTIC VALVE AV Area (Vmax):    1.49 cm AV Area (Vmean):   1.43 cm AV Area (VTI):     1.60 cm AV Vmax:           206.00 cm/s AV Vmean:          132.500 cm/s AV VTI:            0.384 m AV Peak Grad:      17.0 mmHg AV Mean Grad:      8.5 mmHg LVOT Vmax:         97.90 cm/s LVOT Vmean:        60.400 cm/s LVOT VTI:          0.196 m LVOT/AV VTI  ratio: 0.51  AORTA Ao Root diam: 3.50 cm Ao STJ diam:  3.1 cm Ao Asc diam:  4.20 cm MITRAL VALVE                TRICUSPID VALVE MV Area (PHT): 4.71 cm     TR Peak grad:   25.6 mmHg MV Decel Time: 161 msec     TR Vmax:        253.00 cm/s MV E velocity: 90.00 cm/s MV A velocity: 105.00 cm/s  SHUNTS MV E/A ratio:  0.86         Systemic VTI:  0.20 m                             Systemic Diam: 2.00 cm Jenkins Rouge MD Electronically signed by Jenkins Rouge MD Signature Date/Time: 07/08/2021/10:18:10 AM    Final    DG HIP OPERATIVE UNILAT W OR W/O PELVIS LEFT  Result Date: 07/07/2021 CLINICAL DATA:  88 year old female with left hip fracture EXAM: OPERATIVE LEFT HIP (WITH PELVIS IF PERFORMED) 3 VIEWS TECHNIQUE: Fluoroscopic spot image(s) were submitted for interpretation post-operatively.  COMPARISON:  07/06/2021 FINDINGS: Limited intraoperative fluoroscopic spot images of open reduction internal fixation left hip fracture, with relative alignment maintained. IMPRESSION: Intraoperative fluoroscopic spot images of left hip ORIF. Please refer to the dictated operative report for full details of intraoperative findings and procedure. Electronically Signed   By: Corrie Mckusick D.O.   On: 07/07/2021 13:36   DG Hip Unilat With Pelvis 2-3 Views Left  Result Date: 07/06/2021 CLINICAL DATA:  Fall hip pain EXAM: DG HIP (WITH OR WITHOUT PELVIS) 2-3V LEFT COMPARISON:  None. FINDINGS: SI joints are non widened. Pubic symphysis and rami appear intact. Acute nondisplaced intertrochanteric fracture on the left. No femoral head dislocation IMPRESSION: Acute left intertrochanteric fracture Electronically Signed   By: Donavan Foil M.D.   On: 07/06/2021 23:31    Assessment/Plan 1. Gastroesophageal reflux disease without esophagitis Doing well with Prilosec  2. Closed fracture of left hip with routine healing, subsequent encounter Pain Controlled On Norco Therapy thinks she can go home  Will get home therapy both OT and  PT Husband is going to be there with her all the time Will Follow with Dr Alvan Dame Lovenox has to be done by Nurse for 3 days after discharge They are planning to rethink after Holidays next week 3. Anxiety disorder due to medical condition Continue Xanax for now Husband thinks she will not need it once home Also on Zoloft 4. Hyperlipidemia, unspecified hyperlipidemia type On Statin Follow with PCP  5. Bilateral lower abdominal discomfort UA negative Pain better now Can be also due to Iron  Will reduce it to 2/weekly Checking Xray for constipation  6. CLL (chronic lymphocytic leukemia) ([facility]) WBC stable  7. Anemia, unspecified type HGB improved on Iron  8. Mild cognitive disorder MMSE was 24/30 Faled her Clock drawing Also did not get the year  2/3 in recall  9. Stage 3a chronic kidney disease ([place]) Stable   Total time spent in this patient care encounter was  45_  minutes; greater than 50% of the visit spent counseling patient and staff, reviewing records , Labs and coordinating care for problems addressed at this encounter.    Family/ staff Communication:   Labs/tests ordered:

## 2021-08-05 ENCOUNTER — Other Ambulatory Visit: Payer: Self-pay | Admitting: Orthopedic Surgery

## 2021-08-05 ENCOUNTER — Non-Acute Institutional Stay (SKILLED_NURSING_FACILITY): Payer: Medicare Other | Admitting: Orthopedic Surgery

## 2021-08-05 ENCOUNTER — Encounter: Payer: Self-pay | Admitting: Orthopedic Surgery

## 2021-08-05 DIAGNOSIS — D509 Iron deficiency anemia, unspecified: Secondary | ICD-10-CM

## 2021-08-05 DIAGNOSIS — Z4789 Encounter for other orthopedic aftercare: Secondary | ICD-10-CM | POA: Diagnosis not present

## 2021-08-05 DIAGNOSIS — S72142S Displaced intertrochanteric fracture of left femur, sequela: Secondary | ICD-10-CM | POA: Diagnosis not present

## 2021-08-05 DIAGNOSIS — K219 Gastro-esophageal reflux disease without esophagitis: Secondary | ICD-10-CM

## 2021-08-05 DIAGNOSIS — Z66 Do not resuscitate: Secondary | ICD-10-CM

## 2021-08-05 DIAGNOSIS — C9110 Chronic lymphocytic leukemia of B-cell type not having achieved remission: Secondary | ICD-10-CM | POA: Diagnosis not present

## 2021-08-05 DIAGNOSIS — F064 Anxiety disorder due to known physiological condition: Secondary | ICD-10-CM

## 2021-08-05 DIAGNOSIS — Z9181 History of falling: Secondary | ICD-10-CM | POA: Diagnosis not present

## 2021-08-05 DIAGNOSIS — R2689 Other abnormalities of gait and mobility: Secondary | ICD-10-CM | POA: Diagnosis not present

## 2021-08-05 DIAGNOSIS — R278 Other lack of coordination: Secondary | ICD-10-CM | POA: Diagnosis not present

## 2021-08-05 DIAGNOSIS — N1831 Chronic kidney disease, stage 3a: Secondary | ICD-10-CM | POA: Diagnosis not present

## 2021-08-05 DIAGNOSIS — M62552 Muscle wasting and atrophy, not elsewhere classified, left thigh: Secondary | ICD-10-CM | POA: Diagnosis not present

## 2021-08-05 DIAGNOSIS — R1030 Lower abdominal pain, unspecified: Secondary | ICD-10-CM

## 2021-08-05 DIAGNOSIS — E785 Hyperlipidemia, unspecified: Secondary | ICD-10-CM | POA: Diagnosis not present

## 2021-08-05 DIAGNOSIS — M25552 Pain in left hip: Secondary | ICD-10-CM | POA: Diagnosis not present

## 2021-08-05 DIAGNOSIS — M6389 Disorders of muscle in diseases classified elsewhere, multiple sites: Secondary | ICD-10-CM | POA: Diagnosis not present

## 2021-08-05 DIAGNOSIS — F09 Unspecified mental disorder due to known physiological condition: Secondary | ICD-10-CM

## 2021-08-05 DIAGNOSIS — S72002D Fracture of unspecified part of neck of left femur, subsequent encounter for closed fracture with routine healing: Secondary | ICD-10-CM

## 2021-08-05 MED ORDER — ALPRAZOLAM 0.25 MG PO TABS: 0.2500 mg | ORAL_TABLET | Freq: Two times a day (BID) | ORAL | 0 refills | Status: DC | PRN

## 2021-08-05 NOTE — Progress Notes (Signed)
Location:  [facility] Room Number: 155 Place of Service:  SNF (31) Provider:  Yvonna Alanis, NP   Patient Care Team: [name], MD as PCP - General (Internal Medicine) Belva Crome, MD as PCP - Cardiology (Cardiology)  Extended Emergency Contact Information Primary Emergency Contact: [name] F Address: [address]          [place], [place] [address] [name] of [place] Phone: [phone] Mobile Phone: [phone] Relation: Spouse  Code Status:  DNR Goals of care: Advanced Directive information Advanced Directives 08/04/2021  Does Patient Have a Medical Advance Directive? Yes  Type of Paramedic of [place];Living will;Out of facility DNR (pink MOST or yellow form)  Does patient want to make changes to medical advance directive? No - Patient declined  Copy of [place] in Chart? Yes - validated most recent copy scanned in chart (See row information)  Would patient like information on creating a medical advance directive? -  Pre-existing out of facility DNR order (yellow form or pink MOST form) Yellow form placed in chart (order not valid for inpatient use)     Chief Complaint  Patient presents with   Acute Visit    Abdominal pain     HPI:  Pt is a 87 y.o. female seen today for an acute visit for abdominal pain.   She currently resides on the rehabilitation unit at [facility] due to recent left hip fracture. PMH: angina, ascending aortic aneurysm, CAD, CHF, GERD, cognitive disorder, peripheral neuropathy, osteopenia, CKD III, CLL, HLD and iron deficiency anemia.   Husband present during encounter.    She reports intermittent lower abdominal pain within the past few days. KUB 12/22 revealed possible small bowel obstruction or adynamic ileus. She reports having loose stools this morning. Denies nausea and vomiting. + flatus. Eating well. She has been taking hydrocodone s/p IM  nail left hip.   Left hip- hospitalized 11/23-11/26, s/p IM nail, pain controlled with norco, lovenox for dvt prophylaxis, walking with therapy, remains PWB 50% Anxiety- recently having increased anxiety after hip surgery, Xanax started prn, but she continues to be anxious most of the day, denies recent panic attacks HLD- remains on Crestor Mild cognitive disorder- MMSE 24/30, no recent behavioral outbursts CKD 3a- BUN/creat 13/0.8 07/18/2021 CLL- followed by oncology, continues watchful observation, f/u in 6 months GERD- omeprazole recently increased to twice daily, reports decreased reflux Anemia- hgb 8.8 07/18/2021, off iron due to upset stomach  She plans to discharge home 12/26.   Past Medical History:  Diagnosis Date   Abnormal nuclear stress test 01/26/2009   POST EXERCISE PVC's NOTED SINCE 2003   CLL (chronic lymphocytic leukemia) ([place]) 10/2009   DR. KALE   Colon polyp    HYPERPLASTIC   Diverticulosis    DJD (degenerative joint disease)    GERD (gastroesophageal reflux disease)    Hearing loss    DR. SHOEMAKER   Hypercholesterolemia    Hyperlipidemia    Iron deficiency anemia 05/2010   Lesion of breast    LEFT, SMALL   Microscopic hematuria    DR. PETERSON   Neuropathy    PERIPHERAL   Osteopenia    Renal cyst 10/2009   RENAL CYST ON CT 11/2009   Renal disease    STAGE 3   Tendon cysts    EXTENSOR 2MM RIGHT FIRST FINGER   Vitamin D deficiency 10/2011   Past Surgical History:  Procedure Laterality Date   INTRAMEDULLARY (  IM) NAIL INTERTROCHANTERIC Left 07/07/2021   Procedure: INTRAMEDULLARY (IM) NAIL INTERTROCHANTRIC;  Surgeon: Paralee Cancel, MD;  Location: [facility] ORS;  Service: Orthopedics;  Laterality: Left;   LEFT HEART CATHETERIZATION WITH CORONARY ANGIOGRAM N/A 12/25/2011   Procedure: LEFT HEART CATHETERIZATION WITH CORONARY ANGIOGRAM;  Surgeon: Sinclair Grooms, MD;  Location: [facility] CATH LAB;  Service: Cardiovascular;  Laterality: N/A;    Allergies  Allergen  Reactions   Amoxicillin Other (See Comments)    HEADACHES Other reaction(s): Headache   Macrolides And Ketolides     Other reaction(s): Unknown   Metoprolol     Other reaction(s): hypotension   Ciprofloxacin Nausea Only and Rash    And tingling lips and itching   Macrodantin [Nitrofurantoin] Rash   Sulfa Antibiotics Nausea Only and Rash    And tingling lips and itching Other reaction(s): Unknown    Outpatient Encounter Medications as of 08/05/2021  Medication Sig   acetaminophen (TYLENOL) 650 MG CR tablet Take 650 mg by mouth 2 (two) times daily.   ALPRAZolam (XANAX) 0.25 MG tablet Take 0.25 mg by mouth 2 (two) times daily as needed for anxiety.   aspirin 81 MG tablet Take 81 mg by mouth daily.   Coenzyme Q10 (CO Q 10 PO) Take 1 tablet by mouth daily.   Collagen-Boron-Hyaluronic Acid (MOVE FREE ULTRA JOINT HEALTH PO) Take 1 tablet by mouth daily.   docusate sodium (COLACE) 100 MG capsule Take 100 mg by mouth 2 (two) times daily. Hold for loose stools   enoxaparin (LOVENOX) 40 MG/0.4ML injection Inject 0.4 mLs (40 mg total) into the skin daily.   ferrous sulfate 325 (65 FE) MG tablet Take 325 mg by mouth 2 (two) times a week.   HYDROcodone-acetaminophen (NORCO/VICODIN) 5-325 MG tablet Take 1 tablet by mouth 2 (two) times daily.   HYDROcodone-acetaminophen (NORCO/VICODIN) 5-325 MG tablet Take 2 tablets by mouth every 6 (six) hours as needed for moderate pain.   HYDROcodone-acetaminophen (NORCO/VICODIN) 5-325 MG tablet Take 1 tablet by mouth every 6 (six) hours as needed for moderate pain.   methocarbamol (ROBAXIN) 500 MG tablet Take 250 mg by mouth every 4 (four) hours as needed for muscle spasms.   Multiple Vitamin (MULITIVITAMIN WITH MINERALS) TABS Take 1 tablet by mouth daily.   NITROSTAT 0.4 MG SL tablet DISSOLVE ONE TABLET UNDERTONGUE AS DIRECTED AS    NEEDED FOR CHEST PAIN   omeprazole (PRILOSEC) 20 MG capsule Take 20 mg by mouth 2 (two) times daily before a meal.   polyethylene  glycol (MIRALAX / GLYCOLAX) 17 g packet Take 17 g by mouth daily.   Probiotic Product (ALIGN PO) Take 1 capsule by mouth daily at 12 noon.   rosuvastatin (CRESTOR) 5 MG tablet Take 5 mg by mouth at bedtime.   sertraline (ZOLOFT) 25 MG tablet Take 25 mg by mouth daily.   traMADol (ULTRAM) 50 MG tablet Take 50 mg by mouth as needed.   [DISCONTINUED] aluminum-magnesium hydroxide-simethicone (MAALOX) 254-270-62 MG/5ML SUSP Take 30 mLs by mouth every 4 (four) hours as needed.   [DISCONTINUED] Multiple Vitamins-Minerals (ULTRA FREEDA PO) Take 1 tablet by mouth every morning.   [DISCONTINUED] traMADol (ULTRAM) 50 MG tablet Take 1 tablet (50 mg total) by mouth daily as needed for moderate pain.   No facility-administered encounter medications on file as of 08/05/2021.    Review of Systems  Unable to perform ROS: Dementia   Immunization History  Administered Date(s) Administered   Influenza Split 07/05/2011, 05/30/2012, 05/28/2013, 06/09/2015, 06/16/2016   Influenza,  High Dose Seasonal PF 05/04/2014, 05/04/2014, 05/28/2017, 04/30/2018, 06/22/2020   Influenza-Unspecified 05/14/2014, 06/03/2021   Moderna Sars-Covid-2 Vaccination 08/27/2019, 09/24/2019, 05/27/2021   PFIZER(Purple Top)SARS-COV-2 Vaccination 04/29/2020   Pneumococcal Conjugate-13 10/28/2013   Pneumococcal Polysaccharide-23 05/23/2005, 12/28/2017   Td 04/04/2005, 04/04/2005   Tdap 05/07/2015   Zoster, Live 11/27/2011   Pertinent  Health Maintenance Due  Topic Date Due   DEXA SCAN  Never done   INFLUENZA VACCINE  Completed   Fall Risk 07/07/2021 07/07/2021 07/08/2021 07/08/2021 07/09/2021  Patient Fall Risk Level High fall risk High fall risk High fall risk High fall risk High fall risk   Functional Status Survey:    Vitals:   08/05/21 1307  BP: 123/69  Pulse: 65  Resp: 20  Temp: 98.2 F (36.8 C)  SpO2: 97%  Weight: 150 lb (68 kg)  Height: 5' 4.3" (1.633 m)   Body mass index is 25.51 kg/m. Physical Exam Vitals  reviewed.  Constitutional:      General: She is not in acute distress. HENT:     Head: Normocephalic.  Eyes:     General:        Right eye: No discharge.        Left eye: No discharge.  Neck:     Vascular: No carotid bruit.  Cardiovascular:     Rate and Rhythm: Normal rate and regular rhythm.     Pulses: Normal pulses.     Heart sounds: Normal heart sounds.  Pulmonary:     Effort: Pulmonary effort is normal. No respiratory distress.     Breath sounds: Normal breath sounds. No wheezing.  Abdominal:     General: Bowel sounds are normal. There is no distension.     Palpations: Abdomen is soft.     Tenderness: There is no abdominal tenderness.  Musculoskeletal:     Cervical back: Normal range of motion.     Right lower leg: No edema.     Left lower leg: No edema.  Lymphadenopathy:     Cervical: No cervical adenopathy.  Skin:    General: Skin is warm and dry.     Capillary Refill: Capillary refill takes less than 2 seconds.     Comments: Left hip incision, CDI, no drainage, surrounding skin intact.    Neurological:     General: No focal deficit present.     Mental Status: She is alert. Mental status is at baseline.     Motor: Weakness present.     Gait: Gait abnormal.     Comments: walker  Psychiatric:        Mood and Affect: Mood normal.        Behavior: Behavior normal.    Labs reviewed: Recent Labs    07/06/21 2335 07/07/21 0435 07/08/21 0514 07/09/21 0542 07/18/21 0000 08/02/21 0000  NA 136 137 137 136 142   142 136*  K 4.3 4.4 3.9 3.5 4.9   4.9 4.4  CL 105 109 108 106 104   104 99  CO2 25 22 23 23 22   22  27*  GLUCOSE 115* 113* 114* 130*  --   --   BUN 25* 21 13 19 13   13 13   CREATININE 0.95 0.87 0.74 0.81 0.8   0.8 0.8  CALCIUM 9.4 9.0 9.1 9.3 10.0   10.0 10.1  MG 2.1  --   --   --   --   --   PHOS 2.5  --   --   --   --   --  Recent Labs    10/19/20 1436 04/20/21 1401 07/06/21 2335 07/18/21 0000 08/02/21 0000  AST 21 17 21 21   21 21   ALT  15 10 15  32   32 19  ALKPHOS 77 72 61 140*   140* 168*  BILITOT 0.5 0.5 0.8  --   --   PROT 6.8 6.8 6.3*  --   --   ALBUMIN 4.2 4.1 3.9 3.7   3.7 4.2   Recent Labs    04/20/21 1401 07/06/21 2335 07/07/21 0435 07/08/21 0514 07/09/21 0542 07/18/21 0000 08/02/21 0000  WBC 17.2* 18.2* 17.3* 16.9* 22.5* 19.9   19.9 18.1  NEUTROABS 4.1 11.3* 11.8*  --   --   --   --   HGB 11.6* 10.9* 10.3* 9.5* 8.9* 8.8*   8.8* 10.2*  HCT 34.3* 32.6* 31.2* 29.8* 26.6* 28*   28* 32*  MCV 92.2 94.5 94.3 97.7 95.0  --   --   PLT 297 260 247 208 215 539*   539* 318   Lab Results  Component Value Date   TSH 4.845 (H) 07/07/2021   No results found for: HGBA1C No results found for: CHOL, HDL, LDLCALC, LDLDIRECT, TRIG, CHOLHDL  Significant Diagnostic Results in last 30 days:  DG Chest 1 View  Result Date: 07/06/2021 CLINICAL DATA:  Fall with hip fracture EXAM: CHEST  1 VIEW COMPARISON:  02/26/2020 FINDINGS: No focal opacity or pleural effusion. Stable cardiomediastinal silhouette with aortic atherosclerosis. No pneumothorax. IMPRESSION: No active disease. Electronically Signed   By: Donavan Foil M.D.   On: 07/06/2021 23:32   DG Knee Complete 4 Views Left  Result Date: 07/06/2021 CLINICAL DATA:  Recent fall with knee pain, initial encounter EXAM: LEFT KNEE - COMPLETE 4+ VIEW COMPARISON:  None. FINDINGS: Degenerative changes of the left knee joint are seen. No acute fracture or dislocation is seen. Patella is within normal limits. No joint effusion is noted. IMPRESSION: Tricompartmental degenerative changes without acute abnormality. Electronically Signed   By: Inez Catalina M.D.   On: 07/06/2021 23:41   DG C-Arm 1-60 Min-No Report  Result Date: 07/07/2021 CLINICAL DATA:  87 year old female with left hip fracture EXAM: OPERATIVE LEFT HIP (WITH PELVIS IF PERFORMED) 3 VIEWS TECHNIQUE: Fluoroscopic spot image(s) were submitted for interpretation post-operatively. COMPARISON:  07/06/2021 FINDINGS: Limited  intraoperative fluoroscopic spot images of open reduction internal fixation left hip fracture, with relative alignment maintained. IMPRESSION: Intraoperative fluoroscopic spot images of left hip ORIF. Please refer to the dictated operative report for full details of intraoperative findings and procedure. Electronically Signed   By: Corrie Mckusick D.O.   On: 07/07/2021 13:36   ECHOCARDIOGRAM COMPLETE  Result Date: 07/08/2021    ECHOCARDIOGRAM REPORT   Patient Name:   KAZIYAH PARKISON Date of Exam: 07/08/2021 Medical Rec #:  [id number]      Height:       63.0 in Accession #:    [id number]     Weight:       163.8 lb Date of Birth:  September 10, 1925      BSA:          1.776 m Patient Age:    26 years       BP:           144/62 mmHg Patient Gender: F              HR:           92 bpm. Exam Location:  Inpatient Procedure: 2D Echo,  Color Doppler and Cardiac Doppler Indications:    Aortic aneurysm [employer]) [[id number]]  History:        Patient has prior history of Echocardiogram examinations, most                 recent 08/02/2017. CHF; Risk Factors:Hypertension and                 Dyslipidemia. Chronic kidney disease, anemia. Left hip fracture.  Sonographer:    Darlina Sicilian RDCS Referring Phys: [place]  1. Left ventricular ejection fraction, by estimation, is 60 to 65%. The left ventricle has normal function. The left ventricle has no regional wall motion abnormalities. Left ventricular diastolic parameters are consistent with Grade II diastolic dysfunction (pseudonormalization). Elevated left ventricular end-diastolic pressure.  2. Right ventricular systolic function is normal. The right ventricular size is normal. There is normal pulmonary artery systolic pressure.  3. The mitral valve is abnormal. Trivial mitral valve regurgitation. No evidence of mitral stenosis.  4. The aortic valve is tricuspid. There is moderate calcification of the aortic valve. Aortic valve regurgitation is trivial. Mild aortic  valve stenosis.  5. Aortic dilatation noted. There is mild dilatation of the ascending aorta, measuring 42 mm.  6. The inferior vena cava is normal in size with greater than 50% respiratory variability, suggesting right atrial pressure of 3 mmHg. FINDINGS  Left Ventricle: Left ventricular ejection fraction, by estimation, is 60 to 65%. The left ventricle has normal function. The left ventricle has no regional wall motion abnormalities. The left ventricular internal cavity size was normal in size. There is  no left ventricular hypertrophy. Left ventricular diastolic parameters are consistent with Grade II diastolic dysfunction (pseudonormalization). Elevated left ventricular end-diastolic pressure. Right Ventricle: The right ventricular size is normal. No increase in right ventricular wall thickness. Right ventricular systolic function is normal. There is normal pulmonary artery systolic pressure. The tricuspid regurgitant velocity is 2.53 m/s, and  with an assumed right atrial pressure of 3 mmHg, the estimated right ventricular systolic pressure is 99.3 mmHg. Left Atrium: Left atrial size was normal in size. Right Atrium: Right atrial size was normal in size. Pericardium: There is no evidence of pericardial effusion. Mitral Valve: The mitral valve is abnormal. There is mild thickening of the mitral valve leaflet(s). There is mild calcification of the mitral valve leaflet(s). Trivial mitral valve regurgitation. No evidence of mitral valve stenosis. Tricuspid Valve: The tricuspid valve is normal in structure. Tricuspid valve regurgitation is mild . No evidence of tricuspid stenosis. Aortic Valve: The aortic valve is tricuspid. There is moderate calcification of the aortic valve. Aortic valve regurgitation is trivial. Mild aortic stenosis is present. Aortic valve mean gradient measures 8.5 mmHg. Aortic valve peak gradient measures 17.0 mmHg. Aortic valve area, by VTI measures 1.60 cm. Pulmonic Valve: The pulmonic  valve was normal in structure. Pulmonic valve regurgitation is not visualized. No evidence of pulmonic stenosis. Aorta: The aortic root is normal in size and structure and aortic dilatation noted. There is mild dilatation of the ascending aorta, measuring 42 mm. Venous: The inferior vena cava is normal in size with greater than 50% respiratory variability, suggesting right atrial pressure of 3 mmHg. IAS/Shunts: No atrial level shunt detected by color flow Doppler.  LEFT VENTRICLE PLAX 2D LVIDd:         4.10 cm   Diastology LVIDs:         2.20 cm   LV e' medial:    7.54 cm/s LV  PW:         1.00 cm   LV E/e' medial:  11.9 LV IVS:        1.00 cm   LV e' lateral:   4.58 cm/s LVOT diam:     2.00 cm   LV E/e' lateral: 19.7 LV SV:         62 LV SV Index:   35 LVOT Area:     3.14 cm  RIGHT VENTRICLE RV S prime:     17.60 cm/s TAPSE (M-mode): 2.2 cm LEFT ATRIUM             Index LA diam:        2.80 cm 1.58 cm/m LA Vol (A2C):   53.5 ml 30.12 ml/m LA Vol (A4C):   53.9 ml 30.34 ml/m LA Biplane Vol: 57.6 ml 32.43 ml/m  AORTIC VALVE AV Area (Vmax):    1.49 cm AV Area (Vmean):   1.43 cm AV Area (VTI):     1.60 cm AV Vmax:           206.00 cm/s AV Vmean:          132.500 cm/s AV VTI:            0.384 m AV Peak Grad:      17.0 mmHg AV Mean Grad:      8.5 mmHg LVOT Vmax:         97.90 cm/s LVOT Vmean:        60.400 cm/s LVOT VTI:          0.196 m LVOT/AV VTI ratio: 0.51  AORTA Ao Root diam: 3.50 cm Ao STJ diam:  3.1 cm Ao Asc diam:  4.20 cm MITRAL VALVE                TRICUSPID VALVE MV Area (PHT): 4.71 cm     TR Peak grad:   25.6 mmHg MV Decel Time: 161 msec     TR Vmax:        253.00 cm/s MV E velocity: 90.00 cm/s MV A velocity: 105.00 cm/s  SHUNTS MV E/A ratio:  0.86         Systemic VTI:  0.20 m                             Systemic Diam: 2.00 cm Jenkins Rouge MD Electronically signed by Jenkins Rouge MD Signature Date/Time: 07/08/2021/10:18:10 AM    Final    DG HIP OPERATIVE UNILAT W OR W/O PELVIS LEFT  Result Date:  07/07/2021 CLINICAL DATA:  87 year old female with left hip fracture EXAM: OPERATIVE LEFT HIP (WITH PELVIS IF PERFORMED) 3 VIEWS TECHNIQUE: Fluoroscopic spot image(s) were submitted for interpretation post-operatively. COMPARISON:  07/06/2021 FINDINGS: Limited intraoperative fluoroscopic spot images of open reduction internal fixation left hip fracture, with relative alignment maintained. IMPRESSION: Intraoperative fluoroscopic spot images of left hip ORIF. Please refer to the dictated operative report for full details of intraoperative findings and procedure. Electronically Signed   By: Corrie Mckusick D.O.   On: 07/07/2021 13:36   DG Hip Unilat With Pelvis 2-3 Views Left  Result Date: 07/06/2021 CLINICAL DATA:  Fall hip pain EXAM: DG HIP (WITH OR WITHOUT PELVIS) 2-3V LEFT COMPARISON:  None. FINDINGS: SI joints are non widened. Pubic symphysis and rami appear intact. Acute nondisplaced intertrochanteric fracture on the left. No femoral head dislocation IMPRESSION: Acute left intertrochanteric fracture Electronically Signed   By: Donavan Foil  M.D.   On: 07/06/2021 23:31    Assessment/Plan 1. Do not resuscitate  2. Lower abdominal pain - suspect related to hydrocodone use, poor mobility - abdomen soft, no distention, active BS x 4, + flatus - KUB 12/22 indicated SBO versus adynamic ileus - start miralax bid x 2 days - check rectum for impaction - repeat KUB 12/26- may discharge home if resolved - encourage fluids and movement  3. Closed fracture of left hip with routine healing, subsequent encounter - incision CDI - remains PWB 50% - discontinue hydrocodone  - cont tylenol or tramadol for pain - cont lovenox for dvt prophylaxis  4. Anxiety disorder due to medical condition - continues to have anxiety - cont xanax prn - cont zoloft  5. Hyperlipidemia, unspecified hyperlipidemia type - cont crestor  6. Mild cognitive disorder - MMSE 24/30 - forgetful - oncology recommended f/u  CT/MRI brain  7. Stage 3a chronic kidney disease ([place]) - continue to avoid nephrotoxic drugs like NSAIDS and dose adjust medications to be renally excreted - encourage hydration  8. CLL (chronic lymphocytic leukemia) ([place]) - followed by oncology- watchful observation  9. Gastroesophageal reflux disease without esophagitis - improved with omeprazole bid  10. Iron deficiency anemia, unspecified iron deficiency anemia type - iron on hold due to upset stomach    Family/ staff Communication: plan discussed with patient, husband and nurse  Labs/tests ordered:  KUB 12/26

## 2021-08-08 DIAGNOSIS — K567 Ileus, unspecified: Secondary | ICD-10-CM | POA: Diagnosis not present

## 2021-08-10 DIAGNOSIS — Z9181 History of falling: Secondary | ICD-10-CM | POA: Diagnosis not present

## 2021-08-10 DIAGNOSIS — S72142S Displaced intertrochanteric fracture of left femur, sequela: Secondary | ICD-10-CM | POA: Diagnosis not present

## 2021-08-10 DIAGNOSIS — R278 Other lack of coordination: Secondary | ICD-10-CM | POA: Diagnosis not present

## 2021-08-10 DIAGNOSIS — M1612 Unilateral primary osteoarthritis, left hip: Secondary | ICD-10-CM | POA: Diagnosis not present

## 2021-08-10 DIAGNOSIS — M6389 Disorders of muscle in diseases classified elsewhere, multiple sites: Secondary | ICD-10-CM | POA: Diagnosis not present

## 2021-08-10 DIAGNOSIS — Z4789 Encounter for other orthopedic aftercare: Secondary | ICD-10-CM | POA: Diagnosis not present

## 2021-08-11 DIAGNOSIS — M6389 Disorders of muscle in diseases classified elsewhere, multiple sites: Secondary | ICD-10-CM | POA: Diagnosis not present

## 2021-08-11 DIAGNOSIS — R278 Other lack of coordination: Secondary | ICD-10-CM | POA: Diagnosis not present

## 2021-08-11 DIAGNOSIS — S72142S Displaced intertrochanteric fracture of left femur, sequela: Secondary | ICD-10-CM | POA: Diagnosis not present

## 2021-08-11 DIAGNOSIS — I10 Essential (primary) hypertension: Secondary | ICD-10-CM | POA: Diagnosis not present

## 2021-08-11 DIAGNOSIS — Z4789 Encounter for other orthopedic aftercare: Secondary | ICD-10-CM | POA: Diagnosis not present

## 2021-08-11 DIAGNOSIS — Z9181 History of falling: Secondary | ICD-10-CM | POA: Diagnosis not present

## 2021-08-11 LAB — BASIC METABOLIC PANEL
BUN: 11 (ref 4–21)
CO2: 22 (ref 13–22)
Chloride: 95 — AB (ref 99–108)
Creatinine: 0.9 (ref 0.5–1.1)
Glucose: 96
Potassium: 4.2 (ref 3.4–5.3)
Sodium: 131 — AB (ref 137–147)

## 2021-08-11 LAB — COMPREHENSIVE METABOLIC PANEL: Calcium: 10.7 (ref 8.7–10.7)

## 2021-08-15 DIAGNOSIS — Z9181 History of falling: Secondary | ICD-10-CM | POA: Diagnosis not present

## 2021-08-15 DIAGNOSIS — S72142S Displaced intertrochanteric fracture of left femur, sequela: Secondary | ICD-10-CM | POA: Diagnosis not present

## 2021-08-15 DIAGNOSIS — M62552 Muscle wasting and atrophy, not elsewhere classified, left thigh: Secondary | ICD-10-CM | POA: Diagnosis not present

## 2021-08-15 DIAGNOSIS — R2689 Other abnormalities of gait and mobility: Secondary | ICD-10-CM | POA: Diagnosis not present

## 2021-08-15 DIAGNOSIS — R278 Other lack of coordination: Secondary | ICD-10-CM | POA: Diagnosis not present

## 2021-08-15 DIAGNOSIS — M25552 Pain in left hip: Secondary | ICD-10-CM | POA: Diagnosis not present

## 2021-08-15 DIAGNOSIS — M6389 Disorders of muscle in diseases classified elsewhere, multiple sites: Secondary | ICD-10-CM | POA: Diagnosis not present

## 2021-08-15 DIAGNOSIS — Z4789 Encounter for other orthopedic aftercare: Secondary | ICD-10-CM | POA: Diagnosis not present

## 2021-08-16 DIAGNOSIS — F411 Generalized anxiety disorder: Secondary | ICD-10-CM | POA: Diagnosis not present

## 2021-08-16 DIAGNOSIS — R6889 Other general symptoms and signs: Secondary | ICD-10-CM | POA: Diagnosis not present

## 2021-08-16 DIAGNOSIS — Z79899 Other long term (current) drug therapy: Secondary | ICD-10-CM | POA: Diagnosis not present

## 2021-08-16 DIAGNOSIS — R1013 Epigastric pain: Secondary | ICD-10-CM | POA: Diagnosis not present

## 2021-08-16 DIAGNOSIS — R197 Diarrhea, unspecified: Secondary | ICD-10-CM | POA: Diagnosis not present

## 2021-08-19 DIAGNOSIS — Z9181 History of falling: Secondary | ICD-10-CM | POA: Diagnosis not present

## 2021-08-19 DIAGNOSIS — R278 Other lack of coordination: Secondary | ICD-10-CM | POA: Diagnosis not present

## 2021-08-19 DIAGNOSIS — M6389 Disorders of muscle in diseases classified elsewhere, multiple sites: Secondary | ICD-10-CM | POA: Diagnosis not present

## 2021-08-19 DIAGNOSIS — S72142S Displaced intertrochanteric fracture of left femur, sequela: Secondary | ICD-10-CM | POA: Diagnosis not present

## 2021-08-19 DIAGNOSIS — Z4789 Encounter for other orthopedic aftercare: Secondary | ICD-10-CM | POA: Diagnosis not present

## 2021-08-22 ENCOUNTER — Other Ambulatory Visit: Payer: Self-pay

## 2021-08-22 DIAGNOSIS — K5900 Constipation, unspecified: Secondary | ICD-10-CM | POA: Diagnosis not present

## 2021-08-22 DIAGNOSIS — K219 Gastro-esophageal reflux disease without esophagitis: Secondary | ICD-10-CM

## 2021-08-22 DIAGNOSIS — R1013 Epigastric pain: Secondary | ICD-10-CM | POA: Diagnosis not present

## 2021-08-24 DIAGNOSIS — Z4789 Encounter for other orthopedic aftercare: Secondary | ICD-10-CM | POA: Diagnosis not present

## 2021-08-24 DIAGNOSIS — R1013 Epigastric pain: Secondary | ICD-10-CM | POA: Diagnosis not present

## 2021-08-24 DIAGNOSIS — K219 Gastro-esophageal reflux disease without esophagitis: Secondary | ICD-10-CM | POA: Diagnosis not present

## 2021-08-24 DIAGNOSIS — M1712 Unilateral primary osteoarthritis, left knee: Secondary | ICD-10-CM | POA: Diagnosis not present

## 2021-08-26 ENCOUNTER — Ambulatory Visit
Admission: RE | Admit: 2021-08-26 | Discharge: 2021-08-26 | Disposition: A | Discharge status: Home / Self Care | Payer: Medicare Other | Source: Ambulatory Visit | Attending: *Deleted | Admitting: *Deleted

## 2021-08-26 DIAGNOSIS — K219 Gastro-esophageal reflux disease without esophagitis: Secondary | ICD-10-CM

## 2021-08-26 DIAGNOSIS — K449 Diaphragmatic hernia without obstruction or gangrene: Secondary | ICD-10-CM | POA: Diagnosis not present

## 2021-08-26 DIAGNOSIS — R1013 Epigastric pain: Secondary | ICD-10-CM | POA: Diagnosis not present

## 2021-08-26 DIAGNOSIS — K224 Dyskinesia of esophagus: Secondary | ICD-10-CM | POA: Diagnosis not present

## 2021-08-30 DIAGNOSIS — M25552 Pain in left hip: Secondary | ICD-10-CM | POA: Diagnosis not present

## 2021-08-30 DIAGNOSIS — R278 Other lack of coordination: Secondary | ICD-10-CM | POA: Diagnosis not present

## 2021-08-30 DIAGNOSIS — S72142S Displaced intertrochanteric fracture of left femur, sequela: Secondary | ICD-10-CM | POA: Diagnosis not present

## 2021-08-30 DIAGNOSIS — M6389 Disorders of muscle in diseases classified elsewhere, multiple sites: Secondary | ICD-10-CM | POA: Diagnosis not present

## 2021-08-30 DIAGNOSIS — M62552 Muscle wasting and atrophy, not elsewhere classified, left thigh: Secondary | ICD-10-CM | POA: Diagnosis not present

## 2021-08-30 DIAGNOSIS — Z4789 Encounter for other orthopedic aftercare: Secondary | ICD-10-CM | POA: Diagnosis not present

## 2021-08-30 DIAGNOSIS — R2689 Other abnormalities of gait and mobility: Secondary | ICD-10-CM | POA: Diagnosis not present

## 2021-08-30 DIAGNOSIS — Z9181 History of falling: Secondary | ICD-10-CM | POA: Diagnosis not present

## 2021-09-02 DIAGNOSIS — S72142S Displaced intertrochanteric fracture of left femur, sequela: Secondary | ICD-10-CM | POA: Diagnosis not present

## 2021-09-02 DIAGNOSIS — Z9181 History of falling: Secondary | ICD-10-CM | POA: Diagnosis not present

## 2021-09-02 DIAGNOSIS — M25552 Pain in left hip: Secondary | ICD-10-CM | POA: Diagnosis not present

## 2021-09-02 DIAGNOSIS — Z4789 Encounter for other orthopedic aftercare: Secondary | ICD-10-CM | POA: Diagnosis not present

## 2021-09-02 DIAGNOSIS — R278 Other lack of coordination: Secondary | ICD-10-CM | POA: Diagnosis not present

## 2021-09-02 DIAGNOSIS — M62552 Muscle wasting and atrophy, not elsewhere classified, left thigh: Secondary | ICD-10-CM | POA: Diagnosis not present

## 2021-09-02 DIAGNOSIS — R2689 Other abnormalities of gait and mobility: Secondary | ICD-10-CM | POA: Diagnosis not present

## 2021-09-05 DIAGNOSIS — R2689 Other abnormalities of gait and mobility: Secondary | ICD-10-CM | POA: Diagnosis not present

## 2021-09-05 DIAGNOSIS — M25552 Pain in left hip: Secondary | ICD-10-CM | POA: Diagnosis not present

## 2021-09-05 DIAGNOSIS — R278 Other lack of coordination: Secondary | ICD-10-CM | POA: Diagnosis not present

## 2021-09-05 DIAGNOSIS — Z9181 History of falling: Secondary | ICD-10-CM | POA: Diagnosis not present

## 2021-09-05 DIAGNOSIS — S72142S Displaced intertrochanteric fracture of left femur, sequela: Secondary | ICD-10-CM | POA: Diagnosis not present

## 2021-09-05 DIAGNOSIS — M62552 Muscle wasting and atrophy, not elsewhere classified, left thigh: Secondary | ICD-10-CM | POA: Diagnosis not present

## 2021-09-05 DIAGNOSIS — Z4789 Encounter for other orthopedic aftercare: Secondary | ICD-10-CM | POA: Diagnosis not present

## 2021-09-09 DIAGNOSIS — R2689 Other abnormalities of gait and mobility: Secondary | ICD-10-CM | POA: Diagnosis not present

## 2021-09-09 DIAGNOSIS — Z4789 Encounter for other orthopedic aftercare: Secondary | ICD-10-CM | POA: Diagnosis not present

## 2021-09-09 DIAGNOSIS — R278 Other lack of coordination: Secondary | ICD-10-CM | POA: Diagnosis not present

## 2021-09-09 DIAGNOSIS — Z9181 History of falling: Secondary | ICD-10-CM | POA: Diagnosis not present

## 2021-09-09 DIAGNOSIS — S72142S Displaced intertrochanteric fracture of left femur, sequela: Secondary | ICD-10-CM | POA: Diagnosis not present

## 2021-09-09 DIAGNOSIS — M62552 Muscle wasting and atrophy, not elsewhere classified, left thigh: Secondary | ICD-10-CM | POA: Diagnosis not present

## 2021-09-09 DIAGNOSIS — M25552 Pain in left hip: Secondary | ICD-10-CM | POA: Diagnosis not present

## 2021-09-13 DIAGNOSIS — R278 Other lack of coordination: Secondary | ICD-10-CM | POA: Diagnosis not present

## 2021-09-13 DIAGNOSIS — M25552 Pain in left hip: Secondary | ICD-10-CM | POA: Diagnosis not present

## 2021-09-13 DIAGNOSIS — M62552 Muscle wasting and atrophy, not elsewhere classified, left thigh: Secondary | ICD-10-CM | POA: Diagnosis not present

## 2021-09-13 DIAGNOSIS — Z4789 Encounter for other orthopedic aftercare: Secondary | ICD-10-CM | POA: Diagnosis not present

## 2021-09-13 DIAGNOSIS — Z9181 History of falling: Secondary | ICD-10-CM | POA: Diagnosis not present

## 2021-09-13 DIAGNOSIS — S72142S Displaced intertrochanteric fracture of left femur, sequela: Secondary | ICD-10-CM | POA: Diagnosis not present

## 2021-09-13 DIAGNOSIS — R2689 Other abnormalities of gait and mobility: Secondary | ICD-10-CM | POA: Diagnosis not present

## 2021-09-16 DIAGNOSIS — Z4789 Encounter for other orthopedic aftercare: Secondary | ICD-10-CM | POA: Diagnosis not present

## 2021-09-16 DIAGNOSIS — M62552 Muscle wasting and atrophy, not elsewhere classified, left thigh: Secondary | ICD-10-CM | POA: Diagnosis not present

## 2021-09-16 DIAGNOSIS — R278 Other lack of coordination: Secondary | ICD-10-CM | POA: Diagnosis not present

## 2021-09-16 DIAGNOSIS — M25552 Pain in left hip: Secondary | ICD-10-CM | POA: Diagnosis not present

## 2021-09-16 DIAGNOSIS — R2689 Other abnormalities of gait and mobility: Secondary | ICD-10-CM | POA: Diagnosis not present

## 2021-09-16 DIAGNOSIS — Z9181 History of falling: Secondary | ICD-10-CM | POA: Diagnosis not present

## 2021-09-16 DIAGNOSIS — S72142S Displaced intertrochanteric fracture of left femur, sequela: Secondary | ICD-10-CM | POA: Diagnosis not present

## 2021-09-23 DIAGNOSIS — R2689 Other abnormalities of gait and mobility: Secondary | ICD-10-CM | POA: Diagnosis not present

## 2021-09-23 DIAGNOSIS — S72142S Displaced intertrochanteric fracture of left femur, sequela: Secondary | ICD-10-CM | POA: Diagnosis not present

## 2021-09-23 DIAGNOSIS — R278 Other lack of coordination: Secondary | ICD-10-CM | POA: Diagnosis not present

## 2021-09-23 DIAGNOSIS — M62552 Muscle wasting and atrophy, not elsewhere classified, left thigh: Secondary | ICD-10-CM | POA: Diagnosis not present

## 2021-09-23 DIAGNOSIS — Z4789 Encounter for other orthopedic aftercare: Secondary | ICD-10-CM | POA: Diagnosis not present

## 2021-09-23 DIAGNOSIS — M25552 Pain in left hip: Secondary | ICD-10-CM | POA: Diagnosis not present

## 2021-09-23 DIAGNOSIS — Z9181 History of falling: Secondary | ICD-10-CM | POA: Diagnosis not present

## 2021-09-28 ENCOUNTER — Other Ambulatory Visit: Payer: Self-pay | Admitting: Geriatric Medicine

## 2021-09-28 DIAGNOSIS — Z1389 Encounter for screening for other disorder: Secondary | ICD-10-CM | POA: Diagnosis not present

## 2021-09-28 DIAGNOSIS — I700 Atherosclerosis of aorta: Secondary | ICD-10-CM | POA: Diagnosis not present

## 2021-09-28 DIAGNOSIS — R1013 Epigastric pain: Secondary | ICD-10-CM

## 2021-09-28 DIAGNOSIS — E7800 Pure hypercholesterolemia, unspecified: Secondary | ICD-10-CM | POA: Diagnosis not present

## 2021-09-28 DIAGNOSIS — G629 Polyneuropathy, unspecified: Secondary | ICD-10-CM | POA: Diagnosis not present

## 2021-09-28 DIAGNOSIS — I712 Thoracic aortic aneurysm, without rupture, unspecified: Secondary | ICD-10-CM | POA: Diagnosis not present

## 2021-09-28 DIAGNOSIS — C9110 Chronic lymphocytic leukemia of B-cell type not having achieved remission: Secondary | ICD-10-CM | POA: Diagnosis not present

## 2021-09-28 DIAGNOSIS — Z79899 Other long term (current) drug therapy: Secondary | ICD-10-CM | POA: Diagnosis not present

## 2021-09-28 DIAGNOSIS — Z0000 Encounter for general adult medical examination without abnormal findings: Secondary | ICD-10-CM | POA: Diagnosis not present

## 2021-09-28 DIAGNOSIS — F419 Anxiety disorder, unspecified: Secondary | ICD-10-CM | POA: Diagnosis not present

## 2021-09-28 DIAGNOSIS — B0229 Other postherpetic nervous system involvement: Secondary | ICD-10-CM | POA: Diagnosis not present

## 2021-09-29 ENCOUNTER — Ambulatory Visit
Admission: RE | Admit: 2021-09-29 | Discharge: 2021-09-29 | Disposition: A | Discharge status: Home / Self Care | Payer: Medicare Other | Source: Ambulatory Visit | Attending: Geriatric Medicine | Admitting: Geriatric Medicine

## 2021-09-29 ENCOUNTER — Other Ambulatory Visit: Payer: Self-pay | Admitting: Geriatric Medicine

## 2021-09-29 DIAGNOSIS — I712 Thoracic aortic aneurysm, without rupture, unspecified: Secondary | ICD-10-CM | POA: Diagnosis not present

## 2021-09-29 DIAGNOSIS — Z9049 Acquired absence of other specified parts of digestive tract: Secondary | ICD-10-CM | POA: Diagnosis not present

## 2021-09-29 DIAGNOSIS — R1013 Epigastric pain: Secondary | ICD-10-CM

## 2021-09-29 DIAGNOSIS — N281 Cyst of kidney, acquired: Secondary | ICD-10-CM | POA: Diagnosis not present

## 2021-09-29 DIAGNOSIS — I2510 Atherosclerotic heart disease of native coronary artery without angina pectoris: Secondary | ICD-10-CM | POA: Diagnosis not present

## 2021-09-29 DIAGNOSIS — Z4789 Encounter for other orthopedic aftercare: Secondary | ICD-10-CM | POA: Diagnosis not present

## 2021-09-29 DIAGNOSIS — I700 Atherosclerosis of aorta: Secondary | ICD-10-CM | POA: Diagnosis not present

## 2021-09-29 DIAGNOSIS — K5730 Diverticulosis of large intestine without perforation or abscess without bleeding: Secondary | ICD-10-CM | POA: Diagnosis not present

## 2021-09-29 DIAGNOSIS — M1712 Unilateral primary osteoarthritis, left knee: Secondary | ICD-10-CM | POA: Diagnosis not present

## 2021-09-29 MED ORDER — IOPAMIDOL (ISOVUE-370) INJECTION 76%
100.0000 mL | Freq: Once | INTRAVENOUS | Status: AC | PRN
  Administered 2021-09-29: 100 mL via INTRAVENOUS

## 2021-10-10 DIAGNOSIS — R1013 Epigastric pain: Secondary | ICD-10-CM | POA: Diagnosis not present

## 2021-10-10 DIAGNOSIS — K219 Gastro-esophageal reflux disease without esophagitis: Secondary | ICD-10-CM | POA: Diagnosis not present

## 2021-10-10 DIAGNOSIS — R9389 Abnormal findings on diagnostic imaging of other specified body structures: Secondary | ICD-10-CM | POA: Diagnosis not present

## 2021-10-11 ENCOUNTER — Other Ambulatory Visit: Payer: Self-pay | Admitting: Gastroenterology

## 2021-10-11 DIAGNOSIS — R1013 Epigastric pain: Secondary | ICD-10-CM

## 2021-10-11 DIAGNOSIS — R9389 Abnormal findings on diagnostic imaging of other specified body structures: Secondary | ICD-10-CM

## 2021-10-11 DIAGNOSIS — H6123 Impacted cerumen, bilateral: Secondary | ICD-10-CM | POA: Diagnosis not present

## 2021-10-14 ENCOUNTER — Other Ambulatory Visit: Payer: Self-pay | Admitting: *Deleted

## 2021-10-14 DIAGNOSIS — C9110 Chronic lymphocytic leukemia of B-cell type not having achieved remission: Secondary | ICD-10-CM

## 2021-10-18 ENCOUNTER — Other Ambulatory Visit: Payer: Self-pay

## 2021-10-18 ENCOUNTER — Inpatient Hospital Stay: Payer: Medicare Other | Attending: Hematology | Admitting: Hematology

## 2021-10-18 ENCOUNTER — Inpatient Hospital Stay: Payer: Medicare Other

## 2021-10-18 VITALS — BP 114/83 | HR 88 | Temp 97.9°F | Resp 18 | Wt 146.9 lb

## 2021-10-18 DIAGNOSIS — C9110 Chronic lymphocytic leukemia of B-cell type not having achieved remission: Secondary | ICD-10-CM

## 2021-10-18 DIAGNOSIS — Z79899 Other long term (current) drug therapy: Secondary | ICD-10-CM | POA: Diagnosis not present

## 2021-10-18 LAB — CBC WITH DIFFERENTIAL (CANCER CENTER ONLY)
Abs Immature Granulocytes: 0.03 10*3/uL (ref 0.00–0.07)
Basophils Absolute: 0.1 10*3/uL (ref 0.0–0.1)
Basophils Relative: 1 %
Eosinophils Absolute: 0.4 10*3/uL (ref 0.0–0.5)
Eosinophils Relative: 2 %
HCT: 33.1 % — ABNORMAL LOW (ref 36.0–46.0)
Hemoglobin: 11.2 g/dL — ABNORMAL LOW (ref 12.0–15.0)
Immature Granulocytes: 0 %
Lymphocytes Relative: 68 %
Lymphs Abs: 10.6 10*3/uL — ABNORMAL HIGH (ref 0.7–4.0)
MCH: 31.2 pg (ref 26.0–34.0)
MCHC: 33.8 g/dL (ref 30.0–36.0)
MCV: 92.2 fL (ref 80.0–100.0)
Monocytes Absolute: 1 10*3/uL (ref 0.1–1.0)
Monocytes Relative: 7 %
Neutro Abs: 3.4 10*3/uL (ref 1.7–7.7)
Neutrophils Relative %: 22 %
Platelet Count: 366 10*3/uL (ref 150–400)
RBC: 3.59 MIL/uL — ABNORMAL LOW (ref 3.87–5.11)
RDW: 14.9 % (ref 11.5–15.5)
WBC Count: 15.5 10*3/uL — ABNORMAL HIGH (ref 4.0–10.5)
nRBC: 0 % (ref 0.0–0.2)

## 2021-10-18 LAB — CMP (CANCER CENTER ONLY)
ALT: 13 U/L (ref 0–44)
AST: 18 U/L (ref 15–41)
Albumin: 4.1 g/dL (ref 3.5–5.0)
Alkaline Phosphatase: 102 U/L (ref 38–126)
Anion gap: 5 (ref 5–15)
BUN: 20 mg/dL (ref 8–23)
CO2: 28 mmol/L (ref 22–32)
Calcium: 10.4 mg/dL — ABNORMAL HIGH (ref 8.9–10.3)
Chloride: 104 mmol/L (ref 98–111)
Creatinine: 1.03 mg/dL — ABNORMAL HIGH (ref 0.44–1.00)
GFR, Estimated: 51 mL/min — ABNORMAL LOW (ref >60)
Glucose, Bld: 90 mg/dL (ref 70–99)
Potassium: 4.2 mmol/L (ref 3.5–5.1)
Sodium: 137 mmol/L (ref 135–145)
Total Bilirubin: 0.5 mg/dL (ref 0.3–1.2)
Total Protein: 6.5 g/dL (ref 6.5–8.1)

## 2021-10-18 LAB — LACTATE DEHYDROGENASE: LDH: 112 U/L (ref 98–192)

## 2021-10-21 DIAGNOSIS — M25512 Pain in left shoulder: Secondary | ICD-10-CM | POA: Diagnosis not present

## 2021-10-21 DIAGNOSIS — M7552 Bursitis of left shoulder: Secondary | ICD-10-CM | POA: Diagnosis not present

## 2021-10-24 NOTE — Progress Notes (Signed)
HEMATOLOGY/ONCOLOGY CLINIC NOTE  Date of Service: 10/18/2021   Patient Care Team: [name], MD as PCP - General (Internal Medicine) Belva Crome, MD as PCP - Cardiology (Cardiology)  CHIEF COMPLAINTS/PURPOSE OF CONSULTATION:  Follow-up for continued management of CLL HISTORY OF PRESENTING ILLNESS:  Please see previous note for details of initial presentation  INTERVAL HISTORY:  Rebecca King is here accompanied by Rebecca King husband for follow-up for continued evaluation and management of CLL. She notes no fevers no chills no night sweats.  No unexpected weight loss.  No new lumps or bumps.  She unfortunately had a fall and had a fracture of Rebecca King left hip requiring IM nailing on 07/07/2021.  She notes she has recovered well from this and is ambulating with a walker.  She notes some cognitive decline over the last few years.  Labs done today reviewed with the patient.  MEDICAL HISTORY:  Past Medical History:  Diagnosis Date   Abnormal nuclear stress test 01/26/2009   POST EXERCISE PVC's NOTED SINCE 2003   CLL (chronic lymphocytic leukemia) ([place]) 10/2009   DR. Davionte Lusby   Colon polyp    HYPERPLASTIC   Diverticulosis    DJD (degenerative joint disease)    GERD (gastroesophageal reflux disease)    Hearing loss    DR. SHOEMAKER   Hypercholesterolemia    Hyperlipidemia    Iron deficiency anemia 05/2010   Lesion of breast    LEFT, SMALL   Microscopic hematuria    DR. PETERSON   Neuropathy    PERIPHERAL   Osteopenia    Renal cyst 10/2009   RENAL CYST ON CT 11/2009   Renal disease    STAGE 3   Tendon cysts    EXTENSOR 2MM RIGHT FIRST FINGER   Vitamin D deficiency 10/2011  DJD knees Diverticulosis Hearing loss Poor vision CLL March 2011 Microscopic hematuria.  SURGICAL HISTORY: Past Surgical History:  Procedure Laterality Date   INTRAMEDULLARY (IM) NAIL INTERTROCHANTERIC Left 07/07/2021   Procedure: INTRAMEDULLARY (IM) NAIL INTERTROCHANTRIC;  Surgeon: Paralee Cancel, MD;  Location: [facility];  Service: Orthopedics;  Laterality: Left;   LEFT HEART CATHETERIZATION WITH CORONARY ANGIOGRAM N/A 12/25/2011   Procedure: LEFT HEART CATHETERIZATION WITH CORONARY ANGIOGRAM;  Surgeon: Sinclair Grooms, MD;  Location: [facility] C[facility] LAB;  Service: Cardiovascular;  Laterality: N/A;    SOCIAL HISTORY: Social History   Socioeconomic History   Marital status: Married    Spouse name: Not on file   Number of children: Not on file   Years of education: Not on file   Highest education level: Not on file  Occupational History   Occupation: UNEMPLOYED  Tobacco Use   Smoking status: Former    Types: Cigarettes    Quit date: 06/25/1980    Years since quitting: 41.3   Smokeless tobacco: Never  Vaping Use   Vaping Use: Never used  Substance and Sexual Activity   Alcohol use: Yes   Drug use: No   Sexual activity: Not on file  Other Topics Concern   Not on file  Social History Narrative   Not on file   Social Determinants of Health   Financial Resource Strain: Not on file  Food Insecurity: Not on file  Transportation Needs: Not on file  Physical Activity: Not on file  Stress: Not on file  Social Connections: Not on file  Intimate Partner Violence: Not on file    FAMILY HISTORY: Family History  Family history unknown: Yes  ALLERGIES:  is allergic to amoxicillin, macrolides and ketolides, metoprolol, ciprofloxacin, macrodantin [nitrofurantoin], and sulfa antibiotics.  MEDICATIONS:  Current Outpatient Medications  Medication Sig Dispense Refill   acetaminophen (TYLENOL) 650 MG CR tablet Take 650 mg by mouth 2 (two) times daily.     ALPRAZolam (XANAX) 0.25 MG tablet Take 1 tablet (0.25 mg total) by mouth 2 (two) times daily as needed for anxiety. 30 tablet 0   aspirin 81 MG tablet Take 81 mg by mouth daily.     Coenzyme Q10 (CO Q 10 PO) Take 1 tablet by mouth daily.     docusate sodium (COLACE) 100 MG capsule Take 100 mg by mouth 2 (two) times daily.  Hold for loose stools     Multiple Vitamin (MULITIVITAMIN WITH MINERALS) TABS Take 1 tablet by mouth daily.     pantoprazole (PROTONIX) 40 MG tablet Take 1 tablet 30 minutes before meal on an empty stomach     polyethylene glycol (MIRALAX / GLYCOLAX) 17 g packet Take 17 g by mouth daily. 14 each 0   Probiotic Product (ALIGN PO) Take 1 capsule by mouth daily at 12 noon.     rosuvastatin (CRESTOR) 5 MG tablet Take 5 mg by mouth at bedtime.     sertraline (ZOLOFT) 25 MG tablet Take 25 mg by mouth daily. Taking QOD     sucralfate (CARAFATE) 1 g tablet 1 TABLET ON AN EMPTY STOMACH 1 HOUR BEFORE MEAL ORALLY TWICE A DAY 30 DAYS     traMADol (ULTRAM) 50 MG tablet Take 50 mg by mouth as needed.     Collagen-Boron-Hyaluronic Acid (MOVE FREE ULTRA JOINT HEALTH PO) Take 1 tablet by mouth daily.     enoxaparin (LOVENOX) 40 MG/0.4ML injection Inject 0.4 mLs (40 mg total) into the skin daily. (Patient not taking: Reported on 10/18/2021) 0 mL    ferrous sulfate 325 (65 FE) MG tablet Take 325 mg by mouth 2 (two) times a week. (Patient not taking: Reported on 10/18/2021)     HYDROcodone-acetaminophen (NORCO/VICODIN) 5-325 MG tablet Take 1 tablet by mouth 2 (two) times daily. 15 tablet 0   methocarbamol (ROBAXIN) 500 MG tablet Take 250 mg by mouth every 4 (four) hours as needed for muscle spasms. (Patient not taking: Reported on 10/18/2021)     NITROSTAT 0.4 MG SL tablet DISSOLVE ONE TABLET UNDERTONGUE AS DIRECTED AS    NEEDED FOR CHEST PAIN 25 tablet 0   omeprazole (PRILOSEC) 20 MG capsule Take 20 mg by mouth 2 (two) times daily before a meal. (Patient not taking: Reported on 10/18/2021)     No current facility-administered medications for this visit.    REVIEW OF SYSTEMS:   10 Point review of Systems was done is negative except as noted above.  PHYSICAL EXAMINATION: ECOG PERFORMANCE STATUS: 2 - Symptomatic, <50% confined to bed   Vitals:   10/18/21 1512  BP: 114/83  Pulse: 88  Resp: 18  Temp: 97.9 F (36.6  C)  SpO2: 97%   Filed Weights   10/18/21 1512  Weight: 146 lb 14.4 oz (66.6 kg)   Body mass index is 24.98 kg/m. NAD GENERAL:alert, in no acute distress and comfortable SKIN: no acute rashes, no significant lesions EYES: conjunctiva are pink and non-injected, sclera anicteric OROPHARYNX: MMM, no exudates, no oropharyngeal erythema or ulceration NECK: supple, no JVD LYMPH:  no palpable lymphadenopathy in the cervical, axillary or inguinal regions LUNGS: clear to auscultation b/l with normal respiratory effort HEART: regular rate & rhythm ABDOMEN:  normoactive  bowel sounds , non tender, not distended. Extremity: no pedal edema PSYCH: alert & oriented x 3 with fluent speech NEURO: no focal motor/sensory deficits  LABORATORY DATA:  I have reviewed the data as listed  . CBC Latest Ref Rng & Units 10/18/2021 08/02/2021 07/18/2021  WBC 4.0 - 10.5 K/uL 15.5(H) 18.1 19.9  Hemoglobin 12.0 - 15.0 g/dL 11.2(L) 10.2(A) 8.8(A)  Hematocrit 36.0 - 46.0 % 33.1(L) 32(A) 28(A)  Platelets 150 - 400 K/uL 366 318 539(A)   . CBC    Component Value Date/Time   WBC 15.5 (H) 10/18/2021 1506   WBC 22.5 (H) 07/09/2021 0542   RBC 3.59 (L) 10/18/2021 1506   HGB 11.2 (L) 10/18/2021 1506   HGB 12.2 04/26/2017 1344   HCT 33.1 (L) 10/18/2021 1506   HCT 36.3 04/26/2017 1344   PLT 366 10/18/2021 1506   PLT 296 04/26/2017 1344   MCV 92.2 10/18/2021 1506   MCV 94.3 04/26/2017 1344   MCH 31.2 10/18/2021 1506   MCHC 33.8 10/18/2021 1506   RDW 14.9 10/18/2021 1506   RDW 15.4 (H) 04/26/2017 1344   LYMPHSABS 10.6 (H) 10/18/2021 1506   LYMPHSABS 7.8 (H) 04/26/2017 1344   MONOABS 1.0 10/18/2021 1506   MONOABS 0.8 04/26/2017 1344   EOSABS 0.4 10/18/2021 1506   EOSABS 0.1 04/26/2017 1344   BASOSABS 0.1 10/18/2021 1506   BASOSABS 0.0 04/26/2017 1344   . CMP Latest Ref Rng & Units 10/18/2021 08/11/2021 08/02/2021  Glucose 70 - 99 mg/dL 90 - -  BUN 8 - 23 mg/dL '20 11 13  '$ Creatinine 0.44 - 1.00 mg/dL  1.03(H) 0.9 0.8  Sodium 135 - 145 mmol/L 137 131(A) 136(A)  Potassium 3.5 - 5.1 mmol/L 4.2 4.2 4.4  Chloride 98 - 111 mmol/L 104 95(A) 99  CO2 22 - 32 mmol/L 28 22 27(A)  Calcium 8.9 - 10.3 mg/dL 10.4(H) 10.7 10.1  Total Protein 6.5 - 8.1 g/dL 6.5 - -  Total Bilirubin 0.3 - 1.2 mg/dL 0.5 - -  Alkaline Phos 38 - 126 U/L 102 - 168(A)  AST 15 - 41 U/L 18 - 21  ALT 0 - 44 U/L 13 - 19   . Lab Results  Component Value Date   LDH 112 10/18/2021             RADIOGRAPHIC STUDIES: I have personally reviewed the radiological images as listed and agreed with the findings in the report. CT ABDOMEN W CONTRAST  Result Date: 09/29/2021 CLINICAL DATA:  Epigastric pain, acid reflux, history of CLL EXAM: CT ABDOMEN WITH CONTRAST TECHNIQUE: Multidetector CT imaging of the abdomen was performed using the standard protocol following bolus administration of intravenous contrast. RADIATION DOSE REDUCTION: This exam was performed according to the departmental dose-optimization program which includes automated exposure control, adjustment of the mA and/or kV according to patient size and/or use of iterative reconstruction technique. CONTRAST:  147m ISOVUE-370 IOPAMIDOL (ISOVUE-370) INJECTION 76% COMPARISON:  CTA chest abdomen pelvis, 08/06/2018 FINDINGS: Lower chest: Please see separately reported examination of the chest Hepatobiliary: No solid liver abnormality is seen. Status post cholecystectomy with postoperative biliary ductal dilatation up to 1.9 cm. There is a nodule or poorly calcified gallstone near the ampulla measuring 1.0 cm (series 8, image 91). Pancreas: Unremarkable. No pancreatic ductal dilatation or surrounding inflammatory changes. Spleen: Normal in size without significant abnormality. Adrenals/Urinary Tract: Adrenal glands are unremarkable. Multiple simple, benign bilateral renal cysts. Kidneys are otherwise normal, without renal calculi, solid lesion, or hydronephrosis. Bladder is  unremarkable.  Stomach/Bowel: Stomach is within normal limits. No evidence of bowel wall thickening, distention, or inflammatory changes. Pancolonic diverticulosis. Vascular/Lymphatic: Aortic atherosclerosis. No enlarged abdominal or pelvic lymph nodes. Other: No abdominal wall hernia or abnormality. No ascites. Musculoskeletal: No acute or significant osseous findings. IMPRESSION: 1. There is a nodule or poorly calcified gallstone within the common bile duct near the ampulla measuring 1.0 cm, concerning for obstruction. 2. Status post cholecystectomy with postoperative biliary ductal dilatation up to 1.9 cm, generally similar to prior examination. 3. Pancolonic diverticulosis without evidence of acute diverticulitis. These results will be called to the ordering clinician or representative by the Radiologist Assistant, and communication documented in the PACS or [employer]. Aortic Atherosclerosis (ICD10-I70.0). Electronically Signed   By: Delanna Ahmadi M.D.   On: 09/29/2021 13:15   CT ANGIO CHEST AORTA W/CM & OR WO/CM  Result Date: 09/29/2021 CLINICAL DATA:  Follow-up TAA, history of CLL, nonsmoker EXAM: CT ANGIOGRAPHY CHEST WITH CONTRAST TECHNIQUE: Multidetector CT imaging of the chest was performed using the standard protocol during bolus administration of intravenous contrast. Multiplanar CT image reconstructions and MIPs were obtained to evaluate the vascular anatomy. RADIATION DOSE REDUCTION: This exam was performed according to the departmental dose-optimization program which includes automated exposure control, adjustment of the mA and/or kV according to patient size and/or use of iterative reconstruction technique. CONTRAST:  138m ISOVUE-370 IOPAMIDOL (ISOVUE-370) INJECTION 76% COMPARISON:  10/05/2020 FINDINGS: Cardiovascular: Preferential opacification of the thoracic aorta. Unchanged tubular ascending thoracic aorta measures up to 4.2 x 4.2 cm. Aortic valve measures 3.1 cm in caliber. Sinuses of  Valsalva measure up to 3.8 cm in caliber. Mild mixed calcific aortic atherosclerosis. Normal heart size. Left and right coronary artery calcifications. No pericardial effusion. Mediastinum/Nodes: No enlarged mediastinal, hilar, or axillary lymph nodes. Thyroid gland, trachea, and esophagus demonstrate no significant findings. Lungs/Pleura: Occasional small pulmonary nodules are stable and benign, for example a 0.5 cm subpleural nodule of the lateral segment right middle lobe (series 7, image 111) and a 0.6 cm subpleural nodule of anterior right costophrenic recess (series 7, image 26). No pleural effusion or pneumothorax. Upper Abdomen: Please see separately reported examination of the abdomen and pelvis. Musculoskeletal: No chest wall abnormality. No acute osseous findings. Review of the MIP images confirms the above findings. IMPRESSION: 1. Unchanged enlargement of the tubular ascending thoracic aorta measures up to 4.2 x 4.2 cm. Mild mixed calcific aortic atherosclerosis. Recommend annual imaging followup by CTA or MRA if clinically appropriate. This recommendation follows 2010 ACCF/AHA/AATS/ACR/ASA/SCA/SCAI/SIR/STS/SVM Guidelines for the Diagnosis and Management of Patients with Thoracic Aortic Disease. Circulation. 2010; 121:: Q229-N989 Aortic aneurysm NOS (ICD10-I71.9) 2. Occasional small pulmonary nodules are unchanged and benign. 3. Coronary artery disease. Aortic Atherosclerosis (ICD10-I70.0). Electronically Signed   By: ADelanna AhmadiM.D.   On: 09/29/2021 13:10     ASSESSMENT & PLAN:   88y.o. female with   1) Rai Stage 1 Chronic lymphocytic leukemia. Apparently was previously diagnosed in March 2011 but no data available from this. CLL Fish prognostic panel showed predominantly 13q deletion in 63% of the cells. This typically suggests a good prognosis small clone with trisomy 169which is an intermediate prognosis.  PLAN: -Lab work from today was discussed with the patient in detail -CBC shows  hemoglobin of 11.2, WBC count of 15.5k, platelets of 366k.   Patient's hemoglobin was lower due to acute blood loss anemia due to Rebecca King hip surgery but is now improved. CMP stable, borderline elevated calcium at 10.4 likely due to  volume contracted state. LDH within normal limits at 112  Patient has no clinical or lab evidence of CLL progression at this time. Recent imaging studies were reviewed as well and showed no overt evidence of significant new lymphadenopathy.  Spleen was noted to be within normal limits.  She has some incidental findings including a possible lower CBD stone which are being addressed by the primary care physician  FOLLOW UP: RTC w Dr Irene Limbo with labs in 12 months  F/u with PCP in 6 months  The total time spent in the appointment was 20 minutes*.  All of the patient's questions were answered with apparent satisfaction. The patient knows to call the clinic with any problems, questions or concerns.   Sullivan Lone MD Rebecca AAHIVMS [facility] Hematology/Oncology Physician [facility]  .*Total Encounter Time as defined by the Centers for Medicare and Medicaid Services includes, in addition to the face-to-face time of a patient visit (documented in the note above) non-face-to-face time: obtaining and reviewing outside history, ordering and reviewing medications, tests or procedures, care coordination (communications with other health care professionals or caregivers) and documentation in the medical record.

## 2021-10-27 ENCOUNTER — Other Ambulatory Visit: Payer: Medicare Other

## 2021-10-28 ENCOUNTER — Ambulatory Visit
Admission: RE | Admit: 2021-10-28 | Discharge: 2021-10-28 | Disposition: A | Discharge status: Home / Self Care | Payer: Medicare Other | Source: Ambulatory Visit | Attending: Gastroenterology | Admitting: Gastroenterology

## 2021-10-28 DIAGNOSIS — K5730 Diverticulosis of large intestine without perforation or abscess without bleeding: Secondary | ICD-10-CM | POA: Diagnosis not present

## 2021-10-28 DIAGNOSIS — R9389 Abnormal findings on diagnostic imaging of other specified body structures: Secondary | ICD-10-CM

## 2021-10-28 DIAGNOSIS — R1013 Epigastric pain: Secondary | ICD-10-CM

## 2021-10-28 DIAGNOSIS — K8080 Other cholelithiasis without obstruction: Secondary | ICD-10-CM | POA: Diagnosis not present

## 2021-10-28 MED ORDER — GADOBENATE DIMEGLUMINE 529 MG/ML IV SOLN
13.0000 mL | Freq: Once | INTRAVENOUS | Status: AC | PRN
  Administered 2021-10-28: 13 mL via INTRAVENOUS

## 2021-11-01 DIAGNOSIS — M62512 Muscle wasting and atrophy, not elsewhere classified, left shoulder: Secondary | ICD-10-CM | POA: Diagnosis not present

## 2021-11-01 DIAGNOSIS — M25612 Stiffness of left shoulder, not elsewhere classified: Secondary | ICD-10-CM | POA: Diagnosis not present

## 2021-11-01 DIAGNOSIS — M7552 Bursitis of left shoulder: Secondary | ICD-10-CM | POA: Diagnosis not present

## 2021-11-01 DIAGNOSIS — Z9181 History of falling: Secondary | ICD-10-CM | POA: Diagnosis not present

## 2021-11-01 DIAGNOSIS — M150 Primary generalized (osteo)arthritis: Secondary | ICD-10-CM | POA: Diagnosis not present

## 2021-11-04 DIAGNOSIS — M25612 Stiffness of left shoulder, not elsewhere classified: Secondary | ICD-10-CM | POA: Diagnosis not present

## 2021-11-04 DIAGNOSIS — M7552 Bursitis of left shoulder: Secondary | ICD-10-CM | POA: Diagnosis not present

## 2021-11-04 DIAGNOSIS — M62512 Muscle wasting and atrophy, not elsewhere classified, left shoulder: Secondary | ICD-10-CM | POA: Diagnosis not present

## 2021-11-04 DIAGNOSIS — Z9181 History of falling: Secondary | ICD-10-CM | POA: Diagnosis not present

## 2021-11-04 DIAGNOSIS — M150 Primary generalized (osteo)arthritis: Secondary | ICD-10-CM | POA: Diagnosis not present

## 2021-11-08 DIAGNOSIS — M62512 Muscle wasting and atrophy, not elsewhere classified, left shoulder: Secondary | ICD-10-CM | POA: Diagnosis not present

## 2021-11-08 DIAGNOSIS — M25612 Stiffness of left shoulder, not elsewhere classified: Secondary | ICD-10-CM | POA: Diagnosis not present

## 2021-11-08 DIAGNOSIS — M150 Primary generalized (osteo)arthritis: Secondary | ICD-10-CM | POA: Diagnosis not present

## 2021-11-08 DIAGNOSIS — Z9181 History of falling: Secondary | ICD-10-CM | POA: Diagnosis not present

## 2021-11-08 DIAGNOSIS — M7552 Bursitis of left shoulder: Secondary | ICD-10-CM | POA: Diagnosis not present

## 2021-11-10 DIAGNOSIS — S72142A Displaced intertrochanteric fracture of left femur, initial encounter for closed fracture: Secondary | ICD-10-CM | POA: Diagnosis not present

## 2021-11-10 DIAGNOSIS — M1712 Unilateral primary osteoarthritis, left knee: Secondary | ICD-10-CM | POA: Diagnosis not present

## 2021-11-11 DIAGNOSIS — M25612 Stiffness of left shoulder, not elsewhere classified: Secondary | ICD-10-CM | POA: Diagnosis not present

## 2021-11-11 DIAGNOSIS — M150 Primary generalized (osteo)arthritis: Secondary | ICD-10-CM | POA: Diagnosis not present

## 2021-11-11 DIAGNOSIS — M62512 Muscle wasting and atrophy, not elsewhere classified, left shoulder: Secondary | ICD-10-CM | POA: Diagnosis not present

## 2021-11-11 DIAGNOSIS — Z9181 History of falling: Secondary | ICD-10-CM | POA: Diagnosis not present

## 2021-11-11 DIAGNOSIS — M7552 Bursitis of left shoulder: Secondary | ICD-10-CM | POA: Diagnosis not present

## 2021-11-15 DIAGNOSIS — M25612 Stiffness of left shoulder, not elsewhere classified: Secondary | ICD-10-CM | POA: Diagnosis not present

## 2021-11-15 DIAGNOSIS — Z9181 History of falling: Secondary | ICD-10-CM | POA: Diagnosis not present

## 2021-11-15 DIAGNOSIS — M62512 Muscle wasting and atrophy, not elsewhere classified, left shoulder: Secondary | ICD-10-CM | POA: Diagnosis not present

## 2021-11-15 DIAGNOSIS — M150 Primary generalized (osteo)arthritis: Secondary | ICD-10-CM | POA: Diagnosis not present

## 2021-11-15 DIAGNOSIS — M7552 Bursitis of left shoulder: Secondary | ICD-10-CM | POA: Diagnosis not present

## 2021-11-18 DIAGNOSIS — Z9181 History of falling: Secondary | ICD-10-CM | POA: Diagnosis not present

## 2021-11-18 DIAGNOSIS — M150 Primary generalized (osteo)arthritis: Secondary | ICD-10-CM | POA: Diagnosis not present

## 2021-11-18 DIAGNOSIS — M62512 Muscle wasting and atrophy, not elsewhere classified, left shoulder: Secondary | ICD-10-CM | POA: Diagnosis not present

## 2021-11-18 DIAGNOSIS — M7552 Bursitis of left shoulder: Secondary | ICD-10-CM | POA: Diagnosis not present

## 2021-11-18 DIAGNOSIS — M25612 Stiffness of left shoulder, not elsewhere classified: Secondary | ICD-10-CM | POA: Diagnosis not present

## 2021-11-25 ENCOUNTER — Telehealth: Payer: Self-pay | Admitting: Hematology

## 2021-11-25 NOTE — Telephone Encounter (Signed)
Left message with rescheduled upcoming appointment due to provider's template change. 

## 2021-11-29 DIAGNOSIS — M7552 Bursitis of left shoulder: Secondary | ICD-10-CM | POA: Diagnosis not present

## 2021-11-29 DIAGNOSIS — M62512 Muscle wasting and atrophy, not elsewhere classified, left shoulder: Secondary | ICD-10-CM | POA: Diagnosis not present

## 2021-11-29 DIAGNOSIS — M25612 Stiffness of left shoulder, not elsewhere classified: Secondary | ICD-10-CM | POA: Diagnosis not present

## 2021-11-29 DIAGNOSIS — M150 Primary generalized (osteo)arthritis: Secondary | ICD-10-CM | POA: Diagnosis not present

## 2021-11-29 DIAGNOSIS — Z9181 History of falling: Secondary | ICD-10-CM | POA: Diagnosis not present

## 2021-12-07 DIAGNOSIS — M170 Bilateral primary osteoarthritis of knee: Secondary | ICD-10-CM | POA: Diagnosis not present

## 2021-12-28 DIAGNOSIS — C9110 Chronic lymphocytic leukemia of B-cell type not having achieved remission: Secondary | ICD-10-CM | POA: Diagnosis not present

## 2021-12-28 DIAGNOSIS — K219 Gastro-esophageal reflux disease without esophagitis: Secondary | ICD-10-CM | POA: Diagnosis not present

## 2021-12-28 DIAGNOSIS — N1831 Chronic kidney disease, stage 3a: Secondary | ICD-10-CM | POA: Diagnosis not present

## 2021-12-28 DIAGNOSIS — K9089 Other intestinal malabsorption: Secondary | ICD-10-CM | POA: Diagnosis not present

## 2022-02-09 ENCOUNTER — Other Ambulatory Visit: Payer: Self-pay

## 2022-02-09 ENCOUNTER — Emergency Department (HOSPITAL_COMMUNITY): Payer: Medicare Other

## 2022-02-09 ENCOUNTER — Inpatient Hospital Stay (HOSPITAL_COMMUNITY)
Admission: EM | Admit: 2022-02-09 | Discharge: 2022-02-11 | DRG: 445 | Disposition: A | Discharge status: Home / Self Care | Payer: Medicare Other | Attending: Family Medicine | Admitting: Family Medicine

## 2022-02-09 ENCOUNTER — Encounter (HOSPITAL_COMMUNITY): Payer: Self-pay | Admitting: Internal Medicine

## 2022-02-09 DIAGNOSIS — C9110 Chronic lymphocytic leukemia of B-cell type not having achieved remission: Secondary | ICD-10-CM | POA: Diagnosis present

## 2022-02-09 DIAGNOSIS — K8050 Calculus of bile duct without cholangitis or cholecystitis without obstruction: Principal | ICD-10-CM | POA: Diagnosis present

## 2022-02-09 DIAGNOSIS — I5032 Chronic diastolic (congestive) heart failure: Secondary | ICD-10-CM | POA: Diagnosis present

## 2022-02-09 DIAGNOSIS — I7121 Aneurysm of the ascending aorta, without rupture: Secondary | ICD-10-CM | POA: Diagnosis not present

## 2022-02-09 DIAGNOSIS — N1831 Chronic kidney disease, stage 3a: Secondary | ICD-10-CM | POA: Diagnosis not present

## 2022-02-09 DIAGNOSIS — N179 Acute kidney failure, unspecified: Secondary | ICD-10-CM | POA: Diagnosis present

## 2022-02-09 DIAGNOSIS — N838 Other noninflammatory disorders of ovary, fallopian tube and broad ligament: Secondary | ICD-10-CM | POA: Diagnosis not present

## 2022-02-09 DIAGNOSIS — Z880 Allergy status to penicillin: Secondary | ICD-10-CM

## 2022-02-09 DIAGNOSIS — E7800 Pure hypercholesterolemia, unspecified: Secondary | ICD-10-CM | POA: Diagnosis present

## 2022-02-09 DIAGNOSIS — Z7982 Long term (current) use of aspirin: Secondary | ICD-10-CM | POA: Diagnosis not present

## 2022-02-09 DIAGNOSIS — Z882 Allergy status to sulfonamides status: Secondary | ICD-10-CM | POA: Diagnosis not present

## 2022-02-09 DIAGNOSIS — I712 Thoracic aortic aneurysm, without rupture, unspecified: Secondary | ICD-10-CM | POA: Diagnosis not present

## 2022-02-09 DIAGNOSIS — M549 Dorsalgia, unspecified: Secondary | ICD-10-CM | POA: Diagnosis not present

## 2022-02-09 DIAGNOSIS — Z888 Allergy status to other drugs, medicaments and biological substances status: Secondary | ICD-10-CM | POA: Diagnosis not present

## 2022-02-09 DIAGNOSIS — Z66 Do not resuscitate: Secondary | ICD-10-CM | POA: Diagnosis present

## 2022-02-09 DIAGNOSIS — Z87891 Personal history of nicotine dependence: Secondary | ICD-10-CM

## 2022-02-09 DIAGNOSIS — K219 Gastro-esophageal reflux disease without esophagitis: Secondary | ICD-10-CM | POA: Diagnosis present

## 2022-02-09 DIAGNOSIS — Z79899 Other long term (current) drug therapy: Secondary | ICD-10-CM | POA: Diagnosis not present

## 2022-02-09 DIAGNOSIS — K838 Other specified diseases of biliary tract: Secondary | ICD-10-CM | POA: Diagnosis not present

## 2022-02-09 DIAGNOSIS — I509 Heart failure, unspecified: Secondary | ICD-10-CM | POA: Diagnosis not present

## 2022-02-09 DIAGNOSIS — K5730 Diverticulosis of large intestine without perforation or abscess without bleeding: Secondary | ICD-10-CM | POA: Diagnosis not present

## 2022-02-09 DIAGNOSIS — I2510 Atherosclerotic heart disease of native coronary artery without angina pectoris: Secondary | ICD-10-CM | POA: Diagnosis present

## 2022-02-09 DIAGNOSIS — R079 Chest pain, unspecified: Secondary | ICD-10-CM | POA: Diagnosis not present

## 2022-02-09 DIAGNOSIS — I4892 Unspecified atrial flutter: Secondary | ICD-10-CM | POA: Diagnosis not present

## 2022-02-09 DIAGNOSIS — G629 Polyneuropathy, unspecified: Secondary | ICD-10-CM | POA: Diagnosis present

## 2022-02-09 DIAGNOSIS — M1990 Unspecified osteoarthritis, unspecified site: Secondary | ICD-10-CM | POA: Diagnosis not present

## 2022-02-09 DIAGNOSIS — R1013 Epigastric pain: Secondary | ICD-10-CM | POA: Diagnosis not present

## 2022-02-09 DIAGNOSIS — E559 Vitamin D deficiency, unspecified: Secondary | ICD-10-CM | POA: Diagnosis present

## 2022-02-09 DIAGNOSIS — I959 Hypotension, unspecified: Secondary | ICD-10-CM | POA: Diagnosis not present

## 2022-02-09 DIAGNOSIS — R0789 Other chest pain: Secondary | ICD-10-CM | POA: Diagnosis not present

## 2022-02-09 DIAGNOSIS — I209 Angina pectoris, unspecified: Secondary | ICD-10-CM | POA: Diagnosis present

## 2022-02-09 DIAGNOSIS — D649 Anemia, unspecified: Secondary | ICD-10-CM | POA: Diagnosis not present

## 2022-02-09 DIAGNOSIS — N281 Cyst of kidney, acquired: Secondary | ICD-10-CM | POA: Diagnosis not present

## 2022-02-09 LAB — TROPONIN I (HIGH SENSITIVITY)
Troponin I (High Sensitivity): 7 ng/L (ref <18)
Troponin I (High Sensitivity): 6 ng/L (ref <18)

## 2022-02-09 LAB — BASIC METABOLIC PANEL
Anion gap: 10 (ref 5–15)
BUN: 20 mg/dL (ref 8–23)
CO2: 21 mmol/L — ABNORMAL LOW (ref 22–32)
Calcium: 9.4 mg/dL (ref 8.9–10.3)
Chloride: 107 mmol/L (ref 98–111)
Creatinine, Ser: 0.96 mg/dL (ref 0.44–1.00)
GFR, Estimated: 55 mL/min — ABNORMAL LOW (ref >60)
Glucose, Bld: 113 mg/dL — ABNORMAL HIGH (ref 70–99)
Potassium: 4 mmol/L (ref 3.5–5.1)
Sodium: 138 mmol/L (ref 135–145)

## 2022-02-09 LAB — CBC
HCT: 31.3 % — ABNORMAL LOW (ref 36.0–46.0)
Hemoglobin: 10.7 g/dL — ABNORMAL LOW (ref 12.0–15.0)
MCH: 32.4 pg (ref 26.0–34.0)
MCHC: 34.2 g/dL (ref 30.0–36.0)
MCV: 94.8 fL (ref 80.0–100.0)
Platelets: 286 10*3/uL (ref 150–400)
RBC: 3.3 MIL/uL — ABNORMAL LOW (ref 3.87–5.11)
RDW: 15.5 % (ref 11.5–15.5)
WBC: 16.5 10*3/uL — ABNORMAL HIGH (ref 4.0–10.5)
nRBC: 0 % (ref 0.0–0.2)

## 2022-02-09 LAB — I-STAT CHEM 8, ED
BUN: 24 mg/dL — ABNORMAL HIGH (ref 8–23)
Calcium, Ion: 1.06 mmol/L — ABNORMAL LOW (ref 1.15–1.40)
Chloride: 107 mmol/L (ref 98–111)
Creatinine, Ser: 0.9 mg/dL (ref 0.44–1.00)
Glucose, Bld: 109 mg/dL — ABNORMAL HIGH (ref 70–99)
HCT: 30 % — ABNORMAL LOW (ref 36.0–46.0)
Hemoglobin: 10.2 g/dL — ABNORMAL LOW (ref 12.0–15.0)
Potassium: 3.9 mmol/L (ref 3.5–5.1)
Sodium: 137 mmol/L (ref 135–145)
TCO2: 23 mmol/L (ref 22–32)

## 2022-02-09 LAB — HEPATIC FUNCTION PANEL
ALT: 12 U/L (ref 0–44)
AST: 25 U/L (ref 15–41)
Albumin: 3.4 g/dL — ABNORMAL LOW (ref 3.5–5.0)
Alkaline Phosphatase: 68 U/L (ref 38–126)
Bilirubin, Direct: 0.3 mg/dL — ABNORMAL HIGH (ref 0.0–0.2)
Indirect Bilirubin: 0.5 mg/dL (ref 0.3–0.9)
Total Bilirubin: 0.8 mg/dL (ref 0.3–1.2)
Total Protein: 5.3 g/dL — ABNORMAL LOW (ref 6.5–8.1)

## 2022-02-09 LAB — MRSA NEXT GEN BY PCR, NASAL: MRSA by PCR Next Gen: NOT DETECTED

## 2022-02-09 LAB — LIPASE, BLOOD: Lipase: 30 U/L (ref 11–51)

## 2022-02-09 LAB — LACTIC ACID, PLASMA: Lactic Acid, Venous: 0.8 mmol/L (ref 0.5–1.9)

## 2022-02-09 LAB — BRAIN NATRIURETIC PEPTIDE: B Natriuretic Peptide: 158.8 pg/mL — ABNORMAL HIGH (ref 0.0–100.0)

## 2022-02-09 MED ORDER — KETOROLAC TROMETHAMINE 15 MG/ML IJ SOLN
15.0000 mg | Freq: Once | INTRAMUSCULAR | Status: AC
  Administered 2022-02-09: 15 mg via INTRAVENOUS
  Filled 2022-02-09: qty 1

## 2022-02-09 MED ORDER — FENTANYL CITRATE PF 50 MCG/ML IJ SOSY
25.0000 ug | PREFILLED_SYRINGE | INTRAMUSCULAR | Status: AC
  Administered 2022-02-09: 25 ug via INTRAVENOUS
  Filled 2022-02-09: qty 1

## 2022-02-09 MED ORDER — ONDANSETRON HCL 4 MG/2ML IJ SOLN: 4.0000 mg | Freq: Four times a day (QID) | INTRAMUSCULAR | Status: DC | PRN

## 2022-02-09 MED ORDER — TRAMADOL HCL 50 MG PO TABS
50.0000 mg | ORAL_TABLET | Freq: Two times a day (BID) | ORAL | Status: DC | PRN
  Administered 2022-02-09: 50 mg via ORAL
  Filled 2022-02-09: qty 1

## 2022-02-09 MED ORDER — SODIUM CHLORIDE 0.9 % IV SOLN
INTRAVENOUS | Status: DC
Start: 2022-02-09 — End: 2022-02-10

## 2022-02-09 MED ORDER — SERTRALINE HCL 25 MG PO TABS
25.0000 mg | ORAL_TABLET | Freq: Every day | ORAL | Status: DC
  Administered 2022-02-09 – 2022-02-11 (×2): 25 mg via ORAL
  Filled 2022-02-09 (×2): qty 1

## 2022-02-09 MED ORDER — ROSUVASTATIN CALCIUM 5 MG PO TABS
5.0000 mg | ORAL_TABLET | Freq: Every day | ORAL | Status: DC
  Administered 2022-02-09 – 2022-02-10 (×2): 5 mg via ORAL
  Filled 2022-02-09 (×2): qty 1

## 2022-02-09 MED ORDER — ACETAMINOPHEN 325 MG PO TABS
650.0000 mg | ORAL_TABLET | Freq: Two times a day (BID) | ORAL | Status: DC
  Administered 2022-02-09 – 2022-02-11 (×4): 650 mg via ORAL
  Filled 2022-02-09 (×4): qty 2

## 2022-02-09 MED ORDER — ALPRAZOLAM 0.25 MG PO TABS
0.2500 mg | ORAL_TABLET | Freq: Two times a day (BID) | ORAL | Status: DC | PRN
  Administered 2022-02-09: 0.25 mg via ORAL
  Filled 2022-02-09: qty 1

## 2022-02-09 MED ORDER — BISACODYL 5 MG PO TBEC: 5.0000 mg | DELAYED_RELEASE_TABLET | Freq: Every day | ORAL | Status: DC | PRN

## 2022-02-09 MED ORDER — HYDROMORPHONE HCL 1 MG/ML IJ SOLN
0.5000 mg | INTRAMUSCULAR | Status: DC | PRN
  Administered 2022-02-09: 0.5 mg via INTRAVENOUS
  Filled 2022-02-09: qty 0.5

## 2022-02-09 MED ORDER — ENSURE ENLIVE PO LIQD
237.0000 mL | Freq: Two times a day (BID) | ORAL | Status: DC
  Administered 2022-02-11: 237 mL via ORAL

## 2022-02-09 MED ORDER — DOCUSATE SODIUM 100 MG PO CAPS
100.0000 mg | ORAL_CAPSULE | Freq: Two times a day (BID) | ORAL | Status: DC | PRN
Start: 2022-02-09 — End: 2022-02-11

## 2022-02-09 MED ORDER — IOHEXOL 350 MG/ML SOLN
75.0000 mL | Freq: Once | INTRAVENOUS | Status: AC | PRN
  Administered 2022-02-09: 75 mL via INTRAVENOUS

## 2022-02-09 MED ORDER — LIDOCAINE 5 % EX PTCH
1.0000 | MEDICATED_PATCH | Freq: Every day | CUTANEOUS | Status: DC
  Administered 2022-02-09 – 2022-02-11 (×2): 1 via TRANSDERMAL
  Filled 2022-02-09 (×2): qty 1

## 2022-02-09 MED ORDER — PIPERACILLIN-TAZOBACTAM 3.375 G IVPB
3.3750 g | Freq: Three times a day (TID) | INTRAVENOUS | Status: DC
  Administered 2022-02-09 – 2022-02-11 (×7): 3.375 g via INTRAVENOUS
  Filled 2022-02-09 (×9): qty 50

## 2022-02-09 MED ORDER — SUCRALFATE 1 G PO TABS
1.0000 g | ORAL_TABLET | Freq: Two times a day (BID) | ORAL | Status: DC
  Administered 2022-02-09 – 2022-02-11 (×4): 1 g via ORAL
  Filled 2022-02-09 (×6): qty 1

## 2022-02-09 MED ORDER — PANTOPRAZOLE SODIUM 40 MG PO TBEC
40.0000 mg | DELAYED_RELEASE_TABLET | Freq: Every day | ORAL | Status: DC
  Administered 2022-02-09 – 2022-02-10 (×2): 40 mg via ORAL
  Filled 2022-02-09 (×2): qty 1

## 2022-02-09 MED ORDER — SODIUM CHLORIDE 0.9 % IV SOLN: INTRAVENOUS | Status: AC

## 2022-02-09 MED ORDER — KETOROLAC TROMETHAMINE 15 MG/ML IJ SOLN
INTRAMUSCULAR | Status: AC
  Filled 2022-02-09: qty 1

## 2022-02-09 MED ORDER — ASPIRIN 81 MG PO CHEW
81.0000 mg | CHEWABLE_TABLET | Freq: Every day | ORAL | Status: DC
  Administered 2022-02-09 – 2022-02-11 (×2): 81 mg via ORAL
  Filled 2022-02-09 (×2): qty 1

## 2022-02-09 MED ORDER — FENTANYL CITRATE PF 50 MCG/ML IJ SOSY
25.0000 ug | PREFILLED_SYRINGE | INTRAMUSCULAR | Status: AC
Start: 2022-02-09 — End: 2022-02-09
  Administered 2022-02-09: 25 ug via INTRAVENOUS
  Filled 2022-02-09: qty 1

## 2022-02-09 MED ORDER — ONDANSETRON HCL 4 MG PO TABS: 4.0000 mg | ORAL_TABLET | Freq: Four times a day (QID) | ORAL | Status: DC | PRN

## 2022-02-09 MED ORDER — ACETAMINOPHEN 500 MG PO TABS
1000.0000 mg | ORAL_TABLET | ORAL | Status: AC
  Administered 2022-02-09: 1000 mg via ORAL
  Filled 2022-02-09: qty 2

## 2022-02-09 MED ORDER — SODIUM FLUORIDE 1.1 % DT CREA: 1.0000 | TOPICAL_CREAM | Freq: Every day | DENTAL | Status: DC

## 2022-02-09 MED ORDER — ENOXAPARIN SODIUM 40 MG/0.4ML IJ SOSY
40.0000 mg | PREFILLED_SYRINGE | INTRAMUSCULAR | Status: DC
  Administered 2022-02-09 – 2022-02-11 (×3): 40 mg via SUBCUTANEOUS
  Filled 2022-02-09 (×2): qty 0.4

## 2022-02-09 NOTE — ED Provider Notes (Signed)
[facility] EMERGENCY DEPARTMENT Provider Note   CSN: [id number] Arrival date & time: 02/09/22  0349     History  Chief Complaint  Patient presents with   Chest Pain    Rebecca King is a 88 y.o. female.   Chest Pain  Patient is a 88 year old female with past medical history significant for angina, CAD, ascending aortic aneurysm, CLL, HLD, IDA, CHF, mild cognitive disorder.   Patient presents emergency room today with complaints of sharp chest pain that is located approximately at the xiphoid and seems to radiate to her back.  Seems be somewhat pleuritic in nature.  She states that she has had episodes of chest/epigastric pain in the past but never quite so bad as today.  Seems that it woke her out of her sleep this morning and has been sharp and constant since.  She took aspirin and 2 nitroglycerin without improvement in her symptoms.  She denies any nausea or vomiting.  No urinary frequency urgency dysuria or hematuria.  No diaphoresis no lightheadedness dizziness or syncope or near syncope.  No other associated symptoms.     Home Medications Prior to Admission medications   Medication Sig Start Date End Date Taking? Authorizing Provider  acetaminophen (TYLENOL) 650 MG CR tablet Take 650 mg by mouth 2 (two) times daily.   Yes [provider]  ALPRAZolam (XANAX) 0.25 MG tablet Take 1 tablet (0.25 mg total) by mouth 2 (two) times daily as needed for anxiety. 08/05/21  Yes Fargo, Amy E, NP  aspirin 81 MG tablet Take 81 mg by mouth daily.   Yes [provider]  Coenzyme Q10 (CO Q 10 PO) Take 1 tablet by mouth daily.   Yes [provider]  Collagen-Boron-Hyaluronic Acid (MOVE FREE ULTRA JOINT HEALTH PO) Take 1 tablet by mouth daily.   Yes [provider]  DENTA 5000 PLUS 1.1 % CREA dental cream Place 1 Application onto teeth at bedtime. 12/06/21  Yes [provider]  docusate sodium (COLACE) 100 MG capsule Take 100 mg by  mouth 2 (two) times daily as needed for mild constipation. Hold for loose stools   Yes [provider]  Multiple Vitamin (MULITIVITAMIN WITH MINERALS) TABS Take 1 tablet by mouth daily.   Yes [provider]  NITROSTAT 0.4 MG SL tablet DISSOLVE ONE TABLET UNDERTONGUE AS DIRECTED AS    NEEDED FOR CHEST PAIN Patient taking differently: Place 0.4 mg under the tongue every 5 (five) minutes as needed for chest pain. 08/31/13  Yes Belva Crome, MD  pantoprazole (PROTONIX) 40 MG tablet Take 40 mg by mouth daily before supper. 08/22/21  Yes [provider]  polyethylene glycol (MIRALAX / GLYCOLAX) 17 g packet Take 17 g by mouth daily. Patient taking differently: Take 17 g by mouth daily as needed for mild constipation. 07/09/21  Yes Wouk, Ailene Rud, MD  Probiotic Product (ALIGN PO) Take 1 capsule by mouth daily at 12 noon.   Yes [provider]  rosuvastatin (CRESTOR) 5 MG tablet Take 5 mg by mouth at bedtime.   Yes [provider]  sertraline (ZOLOFT) 25 MG tablet Take 25 mg by mouth daily. Taking QOD   Yes [provider]  sucralfate (CARAFATE) 1 g tablet Take 1 g by mouth 2 (two) times daily.   Yes [provider]  traMADol (ULTRAM) 50 MG tablet Take 50 mg by mouth every 12 (twelve) hours as needed for moderate pain.   Yes [provider]  Allergies    Amoxicillin, Macrolides and ketolides, Metoprolol, Ciprofloxacin, Macrodantin [nitrofurantoin], and Sulfa antibiotics    Review of Systems   Review of Systems  Cardiovascular:  Positive for chest pain.    Physical Exam Updated Vital Signs BP 127/65   Pulse 82   Temp 98.6 F (37 C) (Oral)   Resp 16   SpO2 98%  Physical Exam Vitals and nursing note reviewed.  Constitutional:      General: She is not in acute distress.    Comments: Uncomfortable but nontoxic-appearing 88 year old female  HENT:     Head: Normocephalic and atraumatic.     Nose: Nose normal.  Eyes:      General: No scleral icterus. Cardiovascular:     Rate and Rhythm: Normal rate and regular rhythm.     Pulses: Normal pulses.     Heart sounds: Normal heart sounds.     Comments: RRR, bilateral radial and DP pulses 3+ and symmetric Pulmonary:     Effort: Pulmonary effort is normal. No respiratory distress.     Breath sounds: No wheezing.  Abdominal:     Palpations: Abdomen is soft.     Tenderness: There is no abdominal tenderness. There is no guarding or rebound.  Musculoskeletal:     Cervical back: Normal range of motion.     Right lower leg: No edema.     Left lower leg: No edema.  Skin:    General: Skin is warm and dry.     Capillary Refill: Capillary refill takes less than 2 seconds.  Neurological:     Mental Status: She is alert. Mental status is at baseline.  Psychiatric:        Mood and Affect: Mood normal.        Behavior: Behavior normal.     ED Results / Procedures / Treatments   Labs (all labs ordered are listed, but only abnormal results are displayed) Labs Reviewed  CBC - Abnormal; Notable for the following components:      Result Value   WBC 16.5 (*)    RBC 3.30 (*)    Hemoglobin 10.7 (*)    HCT 31.3 (*)    All other components within normal limits  BASIC METABOLIC PANEL - Abnormal; Notable for the following components:   CO2 21 (*)    Glucose, Bld 113 (*)    GFR, Estimated 55 (*)    All other components within normal limits  HEPATIC FUNCTION PANEL - Abnormal; Notable for the following components:   Total Protein 5.3 (*)    Albumin 3.4 (*)    Bilirubin, Direct 0.3 (*)    All other components within normal limits  BRAIN NATRIURETIC PEPTIDE - Abnormal; Notable for the following components:   B Natriuretic Peptide 158.8 (*)    All other components within normal limits  I-STAT CHEM 8, ED - Abnormal; Notable for the following components:   BUN 24 (*)    Glucose, Bld 109 (*)    Calcium, Ion 1.06 (*)    Hemoglobin 10.2 (*)    HCT 30.0 (*)    All  other components within normal limits  LACTIC ACID, PLASMA  LIPASE, BLOOD  TROPONIN I (HIGH SENSITIVITY)  TROPONIN I (HIGH SENSITIVITY)    EKG None  Radiology US Abdomen Limited RUQ (LIVER/GB)  Result Date: 02/09/2022 CLINICAL DATA:  Right upper quadrant abdominal and epigastric pain since last night EXAM: ULTRASOUND ABDOMEN LIMITED RIGHT UPPER QUADRANT COMPARISON:  Same-day CTA abdomen/pelvis, MR abdomen/pelvis 10/29/2018 FINDINGS:  Gallbladder: The gallbladder is not identified, presumed surgically absent. Common bile duct: Diameter: The common bile duct is dilated measuring up to 11 mm with intrahepatic biliary ductal dilation. This is similar to the prior CT as well as the MRI from 10/28/2021. The filling defects seen on the prior CT is not identified on the current study Liver: No focal lesion identified. Within normal limits in parenchymal echogenicity. Portal vein is patent on color Doppler imaging with normal direction of blood flow towards the liver. Other: Right renal cysts are again noted. IMPRESSION: Intra and extrahepatic biliary ductal dilation is not significantly changed compared to the recent CT or MRI from 10/28/2021 but is slightly increased compared to more remote studies. The filling defect seen on the prior CT and MRI is not seen on the current study. Electronically Signed   By: Valetta Mole M.D.   On: 02/09/2022 10:00   CT Angio Chest/Abd/Pel for Dissection W and/or W/WO  Result Date: 02/09/2022 CLINICAL DATA:  88 year old with chest and back pain. Aortic dissection is suspected. EXAM: CT ANGIOGRAPHY CHEST, ABDOMEN AND PELVIS TECHNIQUE: Non-contrast CT of the chest was initially obtained. Multidetector CT imaging through the chest, abdomen and pelvis was performed using the standard protocol during bolus administration of intravenous contrast. Multiplanar reconstructed images and MIPs were obtained and reviewed to evaluate the vascular anatomy. RADIATION DOSE REDUCTION: This  exam was performed according to the departmental dose-optimization program which includes automated exposure control, adjustment of the mA and/or kV according to patient size and/or use of iterative reconstruction technique. CONTRAST:  24m OMNIPAQUE IOHEXOL 350 MG/ML SOLN COMPARISON:  CTA chest 09/29/2021 and chest CTA 02/06/2019 and CTA chest, abdomen, pelvis 08/06/2018 FINDINGS: CTA CHEST FINDINGS Cardiovascular: Limited evaluation of the aortic root due to pulsation artifact. The ascending thoracic aorta is aneurysmal measuring 4.2 cm and stable. No evidence to suggest an aortic dissection or intramural hematoma. Great vessels are patent. Proximal descending thoracic aorta measures 2.6 cm and minimally changed. Mild atherosclerotic disease in the thoracic aorta. Although the study was designed to evaluate the thoracic aorta, there is opacification of the pulmonary arteries and no evidence for large or central pulmonary embolism. Heart size is stable. Minimal pericardial fluid is similar to the previous examination. Mediastinum/Nodes: No lymph node enlargement in the mediastinum, hila or axillary regions. Lungs/Pleura: 3 mm pleural-based nodule in the right lower lobe on 08/109 is stable since 2020. Stable focal density along the right minor fissure on image 8/86 is stable. Stable pleural-based nodularity in the right middle lobe on image 8/104. No evidence for new airspace disease or lung consolidation. No pleural effusions. Additional small nodular densities scattered throughout the lungs are stable since 2020. Musculoskeletal: Thoracic vertebral body heights are maintained. No evidence for a vertebral body compression fracture. No acute bone abnormality. Review of the MIP images confirms the above findings. CTA ABDOMEN AND PELVIS FINDINGS VASCULAR Aorta: Atherosclerotic disease in the abdominal aorta without aneurysm, dissection or significant stenosis. Celiac: Patent without evidence of aneurysm, dissection,  vasculitis or significant stenosis. SMA: Mild narrowing at the origin. No evidence for aneurysm or dissection. Renals: Both renal arteries are patent without evidence of aneurysm, dissection, vasculitis, fibromuscular dysplasia or significant stenosis. IMA: Patent without evidence of aneurysm, dissection, vasculitis or significant stenosis. Inflow: Patent without evidence of aneurysm, dissection, vasculitis or significant stenosis. 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 This is a normal variant. Review of the MIP images confirms the above findings. NON-VASCULAR Hepatobiliary:  Stable appearance of the liver. Cholecystectomy. Again noted is dilated extrahepatic bile duct. However, there is a dense filling defect within the common bile duct on image 7/166. This is most compatible with choledocholithiasis. This filling defect measures up to 1.4 cm on the coronal reformats. Common bile duct measures up to 1.8 cm which is slightly increased from 2019 when the duct measured up to 1.5 cm. There is a small amount of intrahepatic biliary dilatation which is poorly characterized. Pancreas: Unremarkable. No pancreatic ductal dilatation or surrounding inflammatory changes. Spleen: Normal in size without focal abnormality. Adrenals/Urinary Tract: Stable appearance of the adrenal glands without gross abnormality. Again noted are bilateral renal cysts particularly involving the right kidney upper pole. No hydronephrosis. Normal appearance of the urinary bladder. Stomach/Bowel: Moderate amount of stool throughout the colon particularly in the rectum. Scattered colonic diverticula without acute bowel inflammation. Normal appearance of the small bowel without significant dilatation. Normal appearance of the stomach. Lymphatic: Slightly prominent lymph nodes in the abdominal mesentery. Largest nodal tissue measures up to 1.3 cm in the short axis on image 07/209 and suspect this nodal tissue has  slightly increased since 2019 but difficult to compare lymph nodes in this area. In addition, difficult evaluate the central mesenteric lymph nodes due to the arterial phase of contrast. There is no significant retroperitoneal lymph node enlargement. Reproductive: Patient appears to have a small but uterus poorly characterized on this examination. Left ovary/adnexa is prominent on image 7/222 measuring 2.2 cm in the short axis and previously measured 1.9 cm in 2019. No gross abnormality to the right adnexa. Other: Negative for free fluid.  No evidence for free air. Musculoskeletal: Surgical hardware in the proximal left femur. Multilevel disc space narrowing in the lumbar spine. Mild compression deformity along the L2 superior endplate is chronic. No evidence for acute fracture. Review of the MIP images confirms the above findings. IMPRESSION: 1. Negative for aortic dissection or intramural hematoma. 2. Stable fusiform aneurysm of the ascending thoracic aorta measuring 4.2 cm. Recommend annual imaging followup by CTA or MRA. This recommendation follows 2010 ACCF/AHA/AATS/ACR/ASA/SCA/SCAI/SIR/STS/SVM Guidelines for the Diagnosis and Management of Patients with Thoracic Aortic Disease. Circulation. 2010; 121: Y099-I338. Aortic aneurysm NOS (ICD10-I71.9) 3. Slightly increased dilatation of the extrahepatic bile duct with a large filling defect. Findings are most compatible with choledocholithiasis. 4. Slight enlargement of the left ovary/adnexa since 2019. In addition, there are slightly increased mesenteric lymph nodes. These findings are nonspecific and difficult to characterize due to the phase of contrast. Consider follow-up abdominopelvic CT using portal venous phase imaging to better characterize these findings. 5. Colonic diverticulosis. Electronically Signed   By: Markus Daft M.D.   On: 02/09/2022 08:26   DG Chest 2 View  Result Date: 02/09/2022 CLINICAL DATA:  88 year old female presenting with sharp chest  pain radiating to the back. EXAM: CHEST - 2 VIEW COMPARISON:  July 06, 2021. FINDINGS: Cardiomediastinal contours and hilar structures are stable. Heart size mildly enlarged. No lobar consolidation.  No sign of pleural effusion. No visible pneumothorax. On limited assessment there is no acute skeletal process. IMPRESSION: Mild cardiomegaly. No acute cardiopulmonary process. Electronically Signed   By: Zetta Bills M.D.   On: 02/09/2022 08:07    Procedures Procedures    Medications Ordered in ED Medications  ketorolac (TORADOL) 15 MG/ML injection 15 mg (has no administration in time range)  lidocaine (LIDODERM) 5 % 1 patch (has no administration in time range)  HYDROmorphone (DILAUDID) injection 0.5 mg (has no administration in  time range)  fentaNYL (SUBLIMAZE) injection 25 mcg (25 mcg Intravenous Given 02/09/22 0716)  iohexol (OMNIPAQUE) 350 MG/ML injection 75 mL (75 mLs Intravenous Contrast Given 02/09/22 0733)  fentaNYL (SUBLIMAZE) injection 25 mcg (25 mcg Intravenous Given 02/09/22 0828)  acetaminophen (TYLENOL) tablet 1,000 mg (1,000 mg Oral Given 02/09/22 0827)  fentaNYL (SUBLIMAZE) injection 25 mcg (25 mcg Intravenous Given 02/09/22 1152)    ED Course/ Medical Decision Making/ A&P Clinical Course as of 02/09/22 1246  Thu Feb 09, 2022  0720 09/29/21 IMPRESSION: 1. Unchanged enlargement of the tubular ascending thoracic aorta measures up to 4.2 x 4.2 cm. Mild mixed calcific aortic atherosclerosis. Recommend annual imaging followup by CTA or MRA if clinically appropriate. This recommendation follows 2010 ACCF/AHA/AATS/ACR/ASA/SCA/SCAI/SIR/STS/SVM Guidelines for the Diagnosis and Management of Patients with Thoracic Aortic Disease. Circulation. 2010; 121: Y301-S010. Aortic aneurysm NOS (ICD10-I71.9) 2. Occasional small pulmonary nodules are unchanged and benign. 3. Coronary artery disease.  3. Slightly increased dilatation of the extrahepatic bile duct with a large filling defect.  Findings are most compatible with choledocholithiasis. 4. Slight enlargement of the left ovary/adnexa since 2019. In addition, there are slightly increased mesenteric lymph nodes. These findings are nonspecific and difficult to characterize due to the phase of contrast. Consider follow-up abdominopelvic CT using portal venous phase imaging to better characterize these findings. 5. Colonic diverticulosis.   [WF]  1101 Discussed with Dr. Alessandra Bevels.  Ultrasound findings consistent with patient has chronic abnormal biliary tree findings from prior MRCP however given patient's symptoms Dr. Alessandra Bevels will evaluate patient at bedside.  Will admit to medicine.  [WF]    Clinical Course User Index [WF] Tedd Sias, PA                           Medical Decision Making Amount and/or Complexity of Data Reviewed Labs: ordered. Radiology: ordered.  Risk OTC drugs. Prescription drug management. Decision regarding hospitalization.   This patient presents to the ED for concern of chest pain, this involves a number of treatment options, and is a complaint that carries with it a moderate to high risk of complications and morbidity.  The differential diagnosis includes   CP: The emergent causes of chest pain include: Acute coronary syndrome, tamponade, pericarditis/myocarditis, aortic dissection, pulmonary embolism, tension pneumothorax, pneumonia, and esophageal rupture.   Abd pain: The causes of generalized abdominal pain include but are not limited to AAA, mesenteric ischemia, appendicitis, diverticulitis, DKA, gastritis, gastroenteritis, AMI, nephrolithiasis, pancreatitis, peritonitis, adrenal insufficiency,lead poisoning, iron toxicity, intestinal ischemia, constipation, UTI,SBO/LBO, splenic rupture, biliary disease, IBD, IBS, PUD, or hepatitis. Ectopic pregnancy, ovarian torsion, PID.   Co morbidities: Discussed in HPI   Brief History:  Patient is a 88 year old female with past  medical history significant for angina, CAD, ascending aortic aneurysm, CLL, HLD, IDA, CHF, mild cognitive disorder.   Patient presents emergency room today with complaints of sharp chest pain that is located approximately at the xiphoid and seems to radiate to her back.  Seems be somewhat pleuritic in nature.  She states that she has had episodes of chest/epigastric pain in the past but never quite so bad as today.  Seems that it woke her out of her sleep this morning and has been sharp and constant since.  She took aspirin and 2 nitroglycerin without improvement in her symptoms.  She denies any nausea or vomiting.  No urinary frequency urgency dysuria or hematuria.  No diaphoresis no lightheadedness dizziness or syncope or near syncope.  No other associated symptoms.       EMR reviewed including pt PMHx, past surgical history and past visits to ER.   See HPI for more details   Lab Tests:   I ordered and independently interpreted labs. Labs notable for leukocytosis of 16.5 anemia present, LFTs without significant abnormality very marginal elevation in bilirubin.  No transaminitis.  BNP marginally elevated.  Lactic within normal limits lipase within normal limits troponin x2 within normal limits and BMP unremarkable.   Imaging Studies:  Abnormal findings. I personally reviewed all imaging studies. Imaging notable for Stable thoracic aortic aneurysm no dissection present.  Small pulmonary nodules that are unchanged and noted to be benign by radiology.  See clinical course for further discussion.  Upper quadrant ultrasound obtained and I reviewed images, viewed images, reviewed radiology interpretation.  Agree with radiology interpretation.  Cardiac Monitoring:  The patient was maintained on a cardiac monitor.  I personally viewed and interpreted the cardiac monitored which showed an underlying rhythm of: EKG non-ischemic   Medicines ordered:  I ordered medication including  fentanyl x3, Tylenol for pain Reevaluation of the patient after these medicines showed that the patient improved I have reviewed the patients home medicines and have made adjustments as needed   Critical Interventions:     Consults/Attending Physician   I discussed this case with my attending physician who cosigned this note including patient's presenting symptoms, physical exam, and planned diagnostics and interventions. Attending physician stated agreement with plan or made changes to plan which were implemented.  Discussed with Dr. Alessandra Bevels of gastroenterology who will evaluate patient request admission by medicine service.  Reevaluation:  After the interventions noted above I re-evaluated patient and found that they have :improved   Social Determinants of Health:      Problem List / ED Course:  Choledocholithiasis perhaps chronic.  Discussed with fecal gastroenterology who will evaluate patient and request hospitalist admission.  Discussed with Dr. Roosevelt Locks who will admit.   Dispostion:  After consideration of the diagnostic results and the patients response to treatment, I feel that the patent would benefit from admission   Final Clinical Impression(s) / ED Diagnoses Final diagnoses:  Choledocholithiasis    Rx / DC Orders ED Discharge Orders     None         Tedd Sias, [place] 02/09/22 1246    Margette Fast, MD 02/10/22 [id number]

## 2022-02-09 NOTE — ED Notes (Signed)
Patient transported to CT 

## 2022-02-09 NOTE — Progress Notes (Signed)
Pharmacy Antibiotic Note  Rebecca King is a 88 y.o. female admitted on 02/09/2022 with  intra-abdominal infection .  Pharmacy has been consulted for zosyn dosing.  Presented with worsening chest pain- CT indicating poss choledocholithiasis. Plan for ERCP per GI. WBC 16.5, LA 0.8. Afebrile. Allergy to amoxicillin (headache) but pt okay with doing zosyn and monitoring.  Plan: Zosyn 3.375g IV q8h (4 hour infusion). Monitor renal fx, clinical pic Will follow peripherally for any dose adjustment if renal function changes   Height: '5\' 3"'$  (160 cm) Weight: 64 kg (141 lb) IBW/kg (Calculated) : 52.4  Temp (24hrs), Avg:98.2 F (36.8 C), Min:98 F (36.7 C), Max:98.6 F (37 C)  Recent Labs  Lab 02/09/22 0630 02/09/22 0650 02/09/22 0728  WBC 16.5*  --   --   CREATININE 0.96 0.90  --   LATICACIDVEN  --   --  0.8    Estimated Creatinine Clearance: 34.4 mL/min (by C-G formula based on SCr of 0.9 mg/dL).    Allergies  Allergen Reactions   Amoxicillin Other (See Comments)    HEADACHES Other reaction(s): Headache   Macrolides And Ketolides     Other reaction(s): Unknown   Metoprolol     Other reaction(s): hypotension   Ciprofloxacin Nausea Only and Rash    And tingling lips and itching   Macrodantin [Nitrofurantoin] Rash   Sulfa Antibiotics Nausea Only and Rash    And tingling lips and itching Other reaction(s): Unknown    Antimicrobials this admission: Zosyn 6/29 >>   Dose adjustments this admission: N/A  Microbiology results: 6/29 MRSA PCR: pending  Thank you for allowing pharmacy to be a part of this patient's care.  Antonietta Jewel, PharmD, [place] Clinical Pharmacist  Phone: [phone] 02/09/2022 2:29 PM  Please check AMION for all [place] phone numbers After 10:00 PM, call [place] [phone]

## 2022-02-09 NOTE — Consult Note (Signed)
Referring Provider:  EDP Primary Care Physician:  [name], MD Primary Gastroenterologist:  [facility]  Reason for Consultation: Abnormal imaging concerning for CBD stone  HPI: Rebecca King is a 88 y.o. female with past medical history of CHF, coronary artery disease on aspirin and history of CLL presented to the hospital with chest pain.  Patient describes her chest pain as epigastric and substernal in nature with radiating towards bilateral upper quadrant.  Denied any vomiting but had some dry heaves.  Denied any fever.  Similar symptoms many years ago  requiring cholecystectomy.  There was initial concern for aortic dissection so CT angio chest abdomen pelvis was performed which showed stable thoracic aortic aneurysm but did showed large filling defect in the CBD consistent with choledocholithiasis.  She is complaining of occasional coughing and choking while swallowing but denies any other GI symptoms.  Denies blood in the stool or black stool.  Denies diarrhea.  Denies constipation.  Denies unintentional weight loss.  Intermittent epigastric and chest pain for last 1 year now.  Patient had CT scan for evaluation of epigastric abdominal pain in February 2023 which did showed 1 cm nodule or calcified stone at ampulla.  MRI MRCP in March 2023 also showed choledocholithiasis but looks like patient was lost to follow-up.  Past Medical History:  Diagnosis Date   Abnormal nuclear stress test 01/26/2009   POST EXERCISE PVC's NOTED SINCE 2003   CLL (chronic lymphocytic leukemia) ([place]) 10/2009   DR. KALE   Colon polyp    HYPERPLASTIC   Diverticulosis    DJD (degenerative joint disease)    GERD (gastroesophageal reflux disease)    Hearing loss    DR. SHOEMAKER   Hypercholesterolemia    Hyperlipidemia    Iron deficiency anemia 05/2010   Lesion of breast    LEFT, SMALL   Microscopic hematuria    DR. PETERSON   Neuropathy    PERIPHERAL   Osteopenia    Renal cyst 10/2009   RENAL  CYST ON CT 11/2009   Renal disease    STAGE 3   Tendon cysts    EXTENSOR 2MM RIGHT FIRST FINGER   Vitamin D deficiency 10/2011    Past Surgical History:  Procedure Laterality Date   INTRAMEDULLARY (IM) NAIL INTERTROCHANTERIC Left 07/07/2021   Procedure: INTRAMEDULLARY (IM) NAIL INTERTROCHANTRIC;  Surgeon: Paralee Cancel, MD;  Location: [facility];  Service: Orthopedics;  Laterality: Left;   LEFT HEART CATHETERIZATION WITH CORONARY ANGIOGRAM N/A 12/25/2011   Procedure: LEFT HEART CATHETERIZATION WITH CORONARY ANGIOGRAM;  Surgeon: Sinclair Grooms, MD;  Location[facility] CATH LAB;  Service: Cardiovascular;  Laterality: N/A;    Prior to Admission medications   Medication Sig Start Date End Date Taking? Authorizing Provider  acetaminophen (TYLENOL) 650 MG CR tablet Take 650 mg by mouth 2 (two) times daily.   Yes [provider]  ALPRAZolam (XANAX) 0.25 MG tablet Take 1 tablet (0.25 mg total) by mouth 2 (two) times daily as needed for anxiety. 08/05/21  Yes [place], Amy E, NP  aspirin 81 MG tablet Take 81 mg by mouth daily.   Yes [provider]  Coenzyme Q10 (CO Q 10 PO) Take 1 tablet by mouth daily.   Yes [provider]  Collagen-Boron-Hyaluronic Acid (MOVE FREE ULTRA JOINT HEALTH PO) Take 1 tablet by mouth daily.   Yes [provider]  DENTA 5000 PLUS 1.1 % CREA dental cream Place 1 Application onto teeth at bedtime. 12/06/21  Yes [provider]  docusate sodium (COLACE) 100 MG capsule Take 100 mg by mouth 2 (two) times daily as needed for mild constipation. Hold for loose stools   Yes [provider]  Multiple Vitamin (MULITIVITAMIN WITH MINERALS) TABS Take 1 tablet by mouth daily.   Yes [provider]  NITROSTAT 0.4 MG SL tablet DISSOLVE ONE TABLET UNDERTONGUE AS DIRECTED AS    NEEDED FOR CHEST PAIN Patient taking differently: Place 0.4 mg under the tongue every 5 (five) minutes as needed for chest pain. 08/31/13  Yes Belva Crome, MD   pantoprazole (PROTONIX) 40 MG tablet Take 40 mg by mouth daily before supper. 08/22/21  Yes [provider]  polyethylene glycol (MIRALAX / GLYCOLAX) 17 g packet Take 17 g by mouth daily. Patient taking differently: Take 17 g by mouth daily as needed for mild constipation. 07/09/21  Yes Wouk, Ailene Rud, MD  Probiotic Product (ALIGN PO) Take 1 capsule by mouth daily at 12 noon.   Yes [provider]  rosuvastatin (CRESTOR) 5 MG tablet Take 5 mg by mouth at bedtime.   Yes [provider]  sertraline (ZOLOFT) 25 MG tablet Take 25 mg by mouth daily. Taking QOD   Yes [provider]  sucralfate (CARAFATE) 1 g tablet Take 1 g by mouth 2 (two) times daily.   Yes [provider]  traMADol (ULTRAM) 50 MG tablet Take 50 mg by mouth every 12 (twelve) hours as needed for moderate pain.   Yes [provider]    Scheduled Meds:  acetaminophen  650 mg Oral BID   aspirin  81 mg Oral Daily   enoxaparin (LOVENOX) injection  40 mg Subcutaneous Q24H   ketorolac       lidocaine  1 patch Transdermal Daily   pantoprazole  40 mg Oral QAC supper   rosuvastatin  5 mg Oral QHS   sertraline  25 mg Oral Daily   sucralfate  1 g Oral BID   Continuous Infusions:  sodium chloride     PRN Meds:.ALPRAZolam, bisacodyl, docusate sodium, HYDROmorphone (DILAUDID) injection, ketorolac, ondansetron **OR** ondansetron (ZOFRAN) IV, traMADol  Allergies as of 02/09/2022 - Review Complete 02/09/2022  Allergen Reaction Noted   Amoxicillin Other (See Comments) 06/25/2017   Macrolides and ketolides  10/12/2020   Metoprolol  10/12/2020   Ciprofloxacin Nausea Only and Rash 12/21/2011   Macrodantin [nitrofurantoin] Rash 06/25/2017   Sulfa antibiotics Nausea Only and Rash 12/21/2011    Family History  Family history unknown: Yes    Social History   Socioeconomic History   Marital status: Married    Spouse name: Not on file   Number of children: 4   Years of  education: 17   Highest education level: Bachelor's degree (e.g., BA, AB, BS)  Occupational History   Occupation: UNEMPLOYED  Tobacco Use   Smoking status: Former    Types: Cigarettes    Quit date: 06/25/1980    Years since quitting: 41.6   Smokeless tobacco: Never  Vaping Use   Vaping Use: Never used  Substance and Sexual Activity   Alcohol use: Not Currently   Drug use: No   Sexual activity: Yes    Partners: Male    Birth control/protection: Post-menopausal  Other Topics Concern   Not on file  Social History Narrative   Not on file   Social Determinants of Health   Financial Resource Strain: Not on file  Food Insecurity: Not on file  Transportation Needs: Not on file  Physical Activity: Not on  file  Stress: Not on file  Social Connections: Not on file  Intimate Partner Violence: Not on file    Review of Systems: All negative except as stated above in HPI.  Physical Exam: Vital signs: Vitals:   02/09/22 1338 02/09/22 1357  BP:  130/71  Pulse:  82  Resp:  17  Temp: 98 F (36.7 C) 98.1 F (36.7 C)  SpO2:  98%     General:   Elderly appearing patient, not in acute distress HEENT -normocephalic, atraumatic, extraocular movement intact Lungs:  Clear throughout to auscultation.   No wheezes, crackles, or rhonchi. No acute distress. Heart:  Regular rate and rhythm; no murmurs, clicks, rubs,  or gallops. Abdomen: Right upper quadrant tenderness to palpation, bowel sounds present, no peritoneal signs, abdomen is soft Neuro -alert and oriented x3 Psych -mood and affect normal Rectal:  Deferred  GI:  Lab Results: Recent Labs    02/09/22 0630 02/09/22 0650  WBC 16.5*  --   HGB 10.7* 10.2*  HCT 31.3* 30.0*  PLT 286  --    BMET Recent Labs    02/09/22 0630 02/09/22 0650  NA 138 137  K 4.0 3.9  CL 107 107  CO2 21*  --   GLUCOSE 113* 109*  BUN 20 24*  CREATININE 0.96 0.90  CALCIUM 9.4  --    LFT Recent Labs    02/09/22 0630  PROT 5.3*  ALBUMIN  3.4*  AST 25  ALT 12  ALKPHOS 68  BILITOT 0.8  BILIDIR 0.3*  IBILI 0.5   PT/INR No results for input(s): "LABPROT", "INR" in the last 72 hours.   Studies/Results: US Abdomen Limited RUQ (LIVER/GB)  Result Date: 02/09/2022 CLINICAL DATA:  Right upper quadrant abdominal and epigastric pain since last night EXAM: ULTRASOUND ABDOMEN LIMITED RIGHT UPPER QUADRANT COMPARISON:  Same-day CTA abdomen/pelvis, MR abdomen/pelvis 10/29/2018 FINDINGS: Gallbladder: The gallbladder is not identified, presumed surgically absent. Common bile duct: Diameter: The common bile duct is dilated measuring up to 11 mm with intrahepatic biliary ductal dilation. This is similar to the prior CT as well as the MRI from 10/28/2021. The filling defects seen on the prior CT is not identified on the current study Liver: No focal lesion identified. Within normal limits in parenchymal echogenicity. Portal vein is patent on color Doppler imaging with normal direction of blood flow towards the liver. Other: Right renal cysts are again noted. IMPRESSION: Intra and extrahepatic biliary ductal dilation is not significantly changed compared to the recent CT or MRI from 10/28/2021 but is slightly increased compared to more remote studies. The filling defect seen on the prior CT and MRI is not seen on the current study. Electronically Signed   By: Valetta Mole M.D.   On: 02/09/2022 10:00   CT Angio Chest/Abd/Pel for Dissection W and/or W/WO  Result Date: 02/09/2022 CLINICAL DATA:  88 year old with chest and back pain. Aortic dissection is suspected. EXAM: CT ANGIOGRAPHY CHEST, ABDOMEN AND PELVIS TECHNIQUE: Non-contrast CT of the chest was initially obtained. Multidetector CT imaging through the chest, abdomen and pelvis was performed using the standard protocol during bolus administration of intravenous contrast. Multiplanar reconstructed images and MIPs were obtained and reviewed to evaluate the vascular anatomy. RADIATION DOSE REDUCTION:  This exam was performed according to the departmental dose-optimization program which includes automated exposure control, adjustment of the mA and/or kV according to patient size and/or use of iterative reconstruction technique. CONTRAST:  82m OMNIPAQUE IOHEXOL 350 MG/ML SOLN COMPARISON:  CTA chest 09/29/2021  and chest CTA 02/06/2019 and CTA chest, abdomen, pelvis 08/06/2018 FINDINGS: CTA CHEST FINDINGS Cardiovascular: Limited evaluation of the aortic root due to pulsation artifact. The ascending thoracic aorta is aneurysmal measuring 4.2 cm and stable. No evidence to suggest an aortic dissection or intramural hematoma. Great vessels are patent. Proximal descending thoracic aorta measures 2.6 cm and minimally changed. Mild atherosclerotic disease in the thoracic aorta. Although the study was designed to evaluate the thoracic aorta, there is opacification of the pulmonary arteries and no evidence for large or central pulmonary embolism. Heart size is stable. Minimal pericardial fluid is similar to the previous examination. Mediastinum/Nodes: No lymph node enlargement in the mediastinum, hila or axillary regions. Lungs/Pleura: 3 mm pleural-based nodule in the right lower lobe on 08/109 is stable since 2020. Stable focal density along the right minor fissure on image 8/86 is stable. Stable pleural-based nodularity in the right middle lobe on image 8/104. No evidence for new airspace disease or lung consolidation. No pleural effusions. Additional small nodular densities scattered throughout the lungs are stable since 2020. Musculoskeletal: Thoracic vertebral body heights are maintained. No evidence for a vertebral body compression fracture. No acute bone abnormality. Review of the MIP images confirms the above findings. CTA ABDOMEN AND PELVIS FINDINGS VASCULAR Aorta: Atherosclerotic disease in the abdominal aorta without aneurysm, dissection or significant stenosis. Celiac: Patent without evidence of aneurysm,  dissection, vasculitis or significant stenosis. SMA: Mild narrowing at the origin. No evidence for aneurysm or dissection. Renals: Both renal arteries are patent without evidence of aneurysm, dissection, vasculitis, fibromuscular dysplasia or significant stenosis. IMA: Patent without evidence of aneurysm, dissection, vasculitis or significant stenosis. Inflow: Patent without evidence of aneurysm, dissection, vasculitis or significant stenosis. 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 This is a normal variant. Review of the MIP images confirms the above findings. NON-VASCULAR Hepatobiliary: Stable appearance of the liver. Cholecystectomy. Again noted is dilated extrahepatic bile duct. However, there is a dense filling defect within the common bile duct on image 7/166. This is most compatible with choledocholithiasis. This filling defect measures up to 1.4 cm on the coronal reformats. Common bile duct measures up to 1.8 cm which is slightly increased from 2019 when the duct measured up to 1.5 cm. There is a small amount of intrahepatic biliary dilatation which is poorly characterized. Pancreas: Unremarkable. No pancreatic ductal dilatation or surrounding inflammatory changes. Spleen: Normal in size without focal abnormality. Adrenals/Urinary Tract: Stable appearance of the adrenal glands without gross abnormality. Again noted are bilateral renal cysts particularly involving the right kidney upper pole. No hydronephrosis. Normal appearance of the urinary bladder. Stomach/Bowel: Moderate amount of stool throughout the colon particularly in the rectum. Scattered colonic diverticula without acute bowel inflammation. Normal appearance of the small bowel without significant dilatation. Normal appearance of the stomach. Lymphatic: Slightly prominent lymph nodes in the abdominal mesentery. Largest nodal tissue measures up to 1.3 cm in the short axis on image 07/209 and suspect this nodal  tissue has slightly increased since 2019 but difficult to compare lymph nodes in this area. In addition, difficult evaluate the central mesenteric lymph nodes due to the arterial phase of contrast. There is no significant retroperitoneal lymph node enlargement. Reproductive: Patient appears to have a small but uterus poorly characterized on this examination. Left ovary/adnexa is prominent on image 7/222 measuring 2.2 cm in the short axis and previously measured 1.9 cm in 2019. No gross abnormality to the right adnexa. Other: Negative for free fluid.  No evidence for free air. Musculoskeletal: Surgical hardware in the proximal left femur. Multilevel disc space narrowing in the lumbar spine. Mild compression deformity along the L2 superior endplate is chronic. No evidence for acute fracture. Review of the MIP images confirms the above findings. IMPRESSION: 1. Negative for aortic dissection or intramural hematoma. 2. Stable fusiform aneurysm of the ascending thoracic aorta measuring 4.2 cm. Recommend annual imaging followup by CTA or MRA. This recommendation follows 2010 ACCF/AHA/AATS/ACR/ASA/SCA/SCAI/SIR/STS/SVM Guidelines for the Diagnosis and Management of Patients with Thoracic Aortic Disease. Circulation. 2010; 121: O242-P536. Aortic aneurysm NOS (ICD10-I71.9) 3. Slightly increased dilatation of the extrahepatic bile duct with a large filling defect. Findings are most compatible with choledocholithiasis. 4. Slight enlargement of the left ovary/adnexa since 2019. In addition, there are slightly increased mesenteric lymph nodes. These findings are nonspecific and difficult to characterize due to the phase of contrast. Consider follow-up abdominopelvic CT using portal venous phase imaging to better characterize these findings. 5. Colonic diverticulosis. Electronically Signed   By: Markus Daft M.D.   On: 02/09/2022 08:26   DG Chest 2 View  Result Date: 02/09/2022 CLINICAL DATA:  88 year old female presenting with  sharp chest pain radiating to the back. EXAM: CHEST - 2 VIEW COMPARISON:  July 06, 2021. FINDINGS: Cardiomediastinal contours and hilar structures are stable. Heart size mildly enlarged. No lobar consolidation.  No sign of pleural effusion. No visible pneumothorax. On limited assessment there is no acute skeletal process. IMPRESSION: Mild cardiomegaly. No acute cardiopulmonary process. Electronically Signed   By: Zetta Bills M.D.   On: 02/09/2022 08:07    Impression/Plan: -Choledocholithiasis.  Nonobstructing.  Normal LFTs -Chest pain with right upper quadrant abdominal pain and leukocytosis.  Could be from choledocholithiasis. -History of coronary artery disease.  Currently on aspirin.  Recommendations --------------------------- -Plan for ERCP tomorrow.  Discussed with Dr. Watt Climes. -Start Zosyn because of leukocytosis -Repeat labs in the morning -Okay to have soft diet today.  Keep n.p.o. past midnight.  Risks (post ERCP pancreatitis, bleeding, infection, bowel perforation that could require surgery, sedation-related changes in cardiopulmonary systems), benefits (identification and possible treatment of source of symptoms, exclusion of certain causes of symptoms), and alternatives (watchful waiting, radiographic imaging studies, empiric medical treatment)  were explained to patient/family in detail and patient wishes to proceed.    LOS: 0 days   Otis Brace  MD, FACP 02/09/2022, 2:20 PM  Contact #  [phone]

## 2022-02-09 NOTE — H&P (Signed)
History and Physical    Rebecca King IDP:[id number] DOB: 03-29-1926 DOA: 02/09/2022  PCP: [name], MD (Confirm with patient/family/NH records and if not entered, this has to be entered at [facility] point of entry) Patient coming from: Home  I have personally briefly reviewed patient's old medical records in [place]  Chief Complaint: Chest pain  HPI: Rebecca King is a 88 y.o. female with medical history significant of CLL, chronic CBD stone, HLD, CKD stage IIIa, chronic diastolic CHF, thoracic aortic aneurysm, CAD, presented with worsening of chest pain.  Patient woke up in the middle of the night with severe 10/10 sharp-like chest pain, centrally located radiating to the back, denies any nauseous vomiting no palpitations no lightheadedness.  Chest pain worsening with deep breath and certain movements of chest, she took 1 aspirin and 2 nitroglycerin with no relief.  She remembered 10+ years ago when she had acute cholecystitis, she very much had similar chest pain.  She underwent hip surgery last year with generalized anesthesia without problems.  She does have a chronic CBD stone has been following with [facility], and she has had 4 episodes of flareup associated with the CBD stone since last year.  She described her symptoms were more or less the same chest pain but this time was more severe.  Denied any fever chills no nauseous vomiting no diarrhea.  She had a recent MRCP done in March 2023 showed obstructive CBD obstruction.  ED Course: No tachycardia no hypotension afebrile.  CT angiogram negative for dissection, thoracic aneurysm location and size stable.  Persistent obstructive CBD stone 13 mm.  LFTs within normal limits.  Review of Systems: As per HPI otherwise 14 point review of systems negative.    Past Medical History:  Diagnosis Date   Abnormal nuclear stress test 01/26/2009   POST EXERCISE PVC's NOTED SINCE 2003   CLL (chronic lymphocytic leukemia) ([place]) 10/2009    DR. KALE   Colon polyp    HYPERPLASTIC   Diverticulosis    DJD (degenerative joint disease)    GERD (gastroesophageal reflux disease)    Hearing loss    DR. SHOEMAKER   Hypercholesterolemia    Hyperlipidemia    Iron deficiency anemia 05/2010   Lesion of breast    LEFT, SMALL   Microscopic hematuria    DR. PETERSON   Neuropathy    PERIPHERAL   Osteopenia    Renal cyst 10/2009   RENAL CYST ON CT 11/2009   Renal disease    STAGE 3   Tendon cysts    EXTENSOR 2MM RIGHT FIRST FINGER   Vitamin D deficiency 10/2011    Past Surgical History:  Procedure Laterality Date   INTRAMEDULLARY (IM) NAIL INTERTROCHANTERIC Left 07/07/2021   Procedure: INTRAMEDULLARY (IM) NAIL INTERTROCHANTRIC;  Surgeon: Paralee Cancel, MD;  Location: [facility] ORS;  Service: Orthopedics;  Laterality: Left;   LEFT HEART CATHETERIZATION WITH CORONARY ANGIOGRAM N/A 12/25/2011   Procedure: LEFT HEART CATHETERIZATION WITH CORONARY ANGIOGRAM;  Surgeon: Sinclair Grooms, MD;  Location: [facility] CATH LAB;  Service: Cardiovascular;  Laterality: N/A;     reports that she quit smoking about 41 years ago. Her smoking use included cigarettes. She has never used smokeless tobacco. She reports current alcohol use. She reports that she does not use drugs.  Allergies  Allergen Reactions   Amoxicillin Other (See Comments)    HEADACHES Other reaction(s): Headache   Macrolides And Ketolides     Other reaction(s): Unknown   Metoprolol  Other reaction(s): hypotension   Ciprofloxacin Nausea Only and Rash    And tingling lips and itching   Macrodantin [Nitrofurantoin] Rash   Sulfa Antibiotics Nausea Only and Rash    And tingling lips and itching Other reaction(s): Unknown    Family History  Family history unknown: Yes     Prior to Admission medications   Medication Sig Start Date End Date Taking? Authorizing Provider  acetaminophen (TYLENOL) 650 MG CR tablet Take 650 mg by mouth 2 (two) times daily.   Yes [provider]  ALPRAZolam (XANAX) 0.25 MG tablet Take 1 tablet (0.25 mg total) by mouth 2 (two) times daily as needed for anxiety. 08/05/21  Yes Fargo, Amy E, NP  aspirin 81 MG tablet Take 81 mg by mouth daily.   Yes [provider]  Coenzyme Q10 (CO Q 10 PO) Take 1 tablet by mouth daily.   Yes [provider]  Collagen-Boron-Hyaluronic Acid (MOVE FREE ULTRA JOINT HEALTH PO) Take 1 tablet by mouth daily.   Yes [provider]  DENTA 5000 PLUS 1.1 % CREA dental cream Place 1 Application onto teeth at bedtime. 12/06/21  Yes [provider]  docusate sodium (COLACE) 100 MG capsule Take 100 mg by mouth 2 (two) times daily as needed for mild constipation. Hold for loose stools   Yes [provider]  Multiple Vitamin (MULITIVITAMIN WITH MINERALS) TABS Take 1 tablet by mouth daily.   Yes [provider]  NITROSTAT 0.4 MG SL tablet DISSOLVE ONE TABLET UNDERTONGUE AS DIRECTED AS    NEEDED FOR CHEST PAIN Patient taking differently: Place 0.4 mg under the tongue every 5 (five) minutes as needed for chest pain. 08/31/13  Yes Belva Crome, MD  pantoprazole (PROTONIX) 40 MG tablet Take 40 mg by mouth daily before supper. 08/22/21  Yes [provider]  polyethylene glycol (MIRALAX / GLYCOLAX) 17 g packet Take 17 g by mouth daily. Patient taking differently: Take 17 g by mouth daily as needed for mild constipation. 07/09/21  Yes Wouk, Ailene Rud, MD  Probiotic Product (ALIGN PO) Take 1 capsule by mouth daily at 12 noon.   Yes [provider]  rosuvastatin (CRESTOR) 5 MG tablet Take 5 mg by mouth at bedtime.   Yes [provider]  sertraline (ZOLOFT) 25 MG tablet Take 25 mg by mouth daily. Taking QOD   Yes [provider]  sucralfate (CARAFATE) 1 g tablet Take 1 g by mouth 2 (two) times daily.   Yes [provider]  traMADol (ULTRAM) 50 MG tablet Take 50 mg by mouth every 12 (twelve) hours as needed for moderate pain.   Yes  [provider]    Physical Exam: Vitals:   02/09/22 1000 02/09/22 1030 02/09/22 1100 02/09/22 1200  BP: 104/70 (!) 94/51 109/65 127/65  Pulse: 82 74 89 82  Resp: (!) '22 19 14 16  '$ Temp:      TempSrc:      SpO2: 97% 97% 98% 98%    Constitutional: NAD, calm, comfortable Vitals:   02/09/22 1000 02/09/22 1030 02/09/22 1100 02/09/22 1200  BP: 104/70 (!) 94/51 109/65 127/65  Pulse: 82 74 89 82  Resp: (!) '22 19 14 16  '$ Temp:      TempSrc:      SpO2: 97% 97% 98% 98%   Eyes: PERRL, lids and conjunctivae normal ENMT: Mucous membranes are moist. Posterior pharynx clear of any exudate or lesions.Normal dentition.  Neck: normal, supple, no masses, no thyromegaly  Respiratory: clear to auscultation bilaterally, no wheezing, no crackles. Normal respiratory effort. No accessory muscle use.  Cardiovascular: Regular rate and rhythm, no murmurs / rubs / gallops. No extremity edema. 2+ pedal pulses. No carotid bruits.  Abdomen: no tenderness, no masses palpated. No hepatosplenomegaly. Bowel sounds positive.  Musculoskeletal: no clubbing / cyanosis. No joint deformity upper and lower extremities. Good ROM, no contractures. Normal muscle tone.  Skin: no rashes, lesions, ulcers. No induration Neurologic: CN 2-12 grossly intact. Sensation intact, DTR normal. Strength 5/5 in all 4.  Psychiatric: Normal judgment and insight. Alert and oriented x 3. Normal mood.     Labs on Admission: I have personally reviewed following labs and imaging studies  CBC: Recent Labs  Lab 02/09/22 0630 02/09/22 0650  WBC 16.5*  --   HGB 10.7* 10.2*  HCT 31.3* 30.0*  MCV 94.8  --   PLT 286  --    Basic Metabolic Panel: Recent Labs  Lab 02/09/22 0630 02/09/22 0650  NA 138 137  K 4.0 3.9  CL 107 107  CO2 21*  --   GLUCOSE 113* 109*  BUN 20 24*  CREATININE 0.96 0.90  CALCIUM 9.4  --    GFR: CrCl cannot be calculated (Unknown ideal weight.). Liver Function Tests: Recent Labs  Lab 02/09/22 0630   AST 25  ALT 12  ALKPHOS 68  BILITOT 0.8  PROT 5.3*  ALBUMIN 3.4*   Recent Labs  Lab 02/09/22 0835  LIPASE 30   No results for input(s): "AMMONIA" in the last 168 hours. Coagulation Profile: No results for input(s): "INR", "PROTIME" in the last 168 hours. Cardiac Enzymes: No results for input(s): "CKTOTAL", "CKMB", "CKMBINDEX", "TROPONINI" in the last 168 hours. BNP (last 3 results) No results for input(s): "PROBNP" in the last 8760 hours. HbA1C: No results for input(s): "HGBA1C" in the last 72 hours. CBG: No results for input(s): "GLUCAP" in the last 168 hours. Lipid Profile: No results for input(s): "CHOL", "HDL", "LDLCALC", "TRIG", "CHOLHDL", "LDLDIRECT" in the last 72 hours. Thyroid Function Tests: No results for input(s): "TSH", "T4TOTAL", "FREET4", "T3FREE", "THYROIDAB" in the last 72 hours. Anemia Panel: No results for input(s): "VITAMINB12", "FOLATE", "FERRITIN", "TIBC", "IRON", "RETICCTPCT" in the last 72 hours. Urine analysis:    Component Value Date/Time   COLORURINE STRAW (A) 08/06/2018 0940   APPEARANCEUR CLEAR 08/06/2018 0940   LABSPEC 1.005 08/06/2018 0940   PHURINE 8.0 08/06/2018 0940   GLUCOSEU NEGATIVE 08/06/2018 0940   HGBUR NEGATIVE 08/06/2018 0940   BILIRUBINUR NEGATIVE 08/06/2018 0940   KETONESUR NEGATIVE 08/06/2018 0940   PROTEINUR NEGATIVE 08/06/2018 0940   UROBILINOGEN 0.2 11/01/2012 0420   NITRITE NEGATIVE 08/06/2018 0940   LEUKOCYTESUR NEGATIVE 08/06/2018 0940    Radiological Exams on Admission: US Abdomen Limited RUQ (LIVER/GB)  Result Date: 02/09/2022 CLINICAL DATA:  Right upper quadrant abdominal and epigastric pain since last night EXAM: ULTRASOUND ABDOMEN LIMITED RIGHT UPPER QUADRANT COMPARISON:  Same-day CTA abdomen/pelvis, MR abdomen/pelvis 10/29/2018 FINDINGS: Gallbladder: The gallbladder is not identified, presumed surgically absent. Common bile duct: Diameter: The common bile duct is dilated measuring up to 11 mm with intrahepatic  biliary ductal dilation. This is similar to the prior CT as well as the MRI from 10/28/2021. The filling defects seen on the prior CT is not identified on the current study Liver: No focal lesion identified. Within normal limits in parenchymal echogenicity. Portal vein is patent on color Doppler imaging with normal direction of blood flow towards the liver. Other: Right renal cysts are again  noted. IMPRESSION: Intra and extrahepatic biliary ductal dilation is not significantly changed compared to the recent CT or MRI from 10/28/2021 but is slightly increased compared to more remote studies. The filling defect seen on the prior CT and MRI is not seen on the current study. Electronically Signed   By: Valetta Mole M.D.   On: 02/09/2022 10:00   CT Angio Chest/Abd/Pel for Dissection W and/or W/WO  Result Date: 02/09/2022 CLINICAL DATA:  88 year old with chest and back pain. Aortic dissection is suspected. EXAM: CT ANGIOGRAPHY CHEST, ABDOMEN AND PELVIS TECHNIQUE: Non-contrast CT of the chest was initially obtained. Multidetector CT imaging through the chest, abdomen and pelvis was performed using the standard protocol during bolus administration of intravenous contrast. Multiplanar reconstructed images and MIPs were obtained and reviewed to evaluate the vascular anatomy. RADIATION DOSE REDUCTION: This exam was performed according to the departmental dose-optimization program which includes automated exposure control, adjustment of the mA and/or kV according to patient size and/or use of iterative reconstruction technique. CONTRAST:  28m OMNIPAQUE IOHEXOL 350 MG/ML SOLN COMPARISON:  CTA chest 09/29/2021 and chest CTA 02/06/2019 and CTA chest, abdomen, pelvis 08/06/2018 FINDINGS: CTA CHEST FINDINGS Cardiovascular: Limited evaluation of the aortic root due to pulsation artifact. The ascending thoracic aorta is aneurysmal measuring 4.2 cm and stable. No evidence to suggest an aortic dissection or intramural hematoma.  Great vessels are patent. Proximal descending thoracic aorta measures 2.6 cm and minimally changed. Mild atherosclerotic disease in the thoracic aorta. Although the study was designed to evaluate the thoracic aorta, there is opacification of the pulmonary arteries and no evidence for large or central pulmonary embolism. Heart size is stable. Minimal pericardial fluid is similar to the previous examination. Mediastinum/Nodes: No lymph node enlargement in the mediastinum, hila or axillary regions. Lungs/Pleura: 3 mm pleural-based nodule in the right lower lobe on 08/109 is stable since 2020. Stable focal density along the right minor fissure on image 8/86 is stable. Stable pleural-based nodularity in the right middle lobe on image 8/104. No evidence for new airspace disease or lung consolidation. No pleural effusions. Additional small nodular densities scattered throughout the lungs are stable since 2020. Musculoskeletal: Thoracic vertebral body heights are maintained. No evidence for a vertebral body compression fracture. No acute bone abnormality. Review of the MIP images confirms the above findings. CTA ABDOMEN AND PELVIS FINDINGS VASCULAR Aorta: Atherosclerotic disease in the abdominal aorta without aneurysm, dissection or significant stenosis. Celiac: Patent without evidence of aneurysm, dissection, vasculitis or significant stenosis. SMA: Mild narrowing at the origin. No evidence for aneurysm or dissection. Renals: Both renal arteries are patent without evidence of aneurysm, dissection, vasculitis, fibromuscular dysplasia or significant stenosis. IMA: Patent without evidence of aneurysm, dissection, vasculitis or significant stenosis. Inflow: Patent without evidence of aneurysm, dissection, vasculitis or significant stenosis. 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 This is a normal variant. Review of the MIP images confirms the above findings. NON-VASCULAR Hepatobiliary:  Stable appearance of the liver. Cholecystectomy. Again noted is dilated extrahepatic bile duct. However, there is a dense filling defect within the common bile duct on image 7/166. This is most compatible with choledocholithiasis. This filling defect measures up to 1.4 cm on the coronal reformats. Common bile duct measures up to 1.8 cm which is slightly increased from 2019 when the duct measured up to 1.5 cm. There is a small amount of intrahepatic biliary dilatation which is poorly characterized. Pancreas: Unremarkable. No pancreatic ductal dilatation or surrounding inflammatory changes. Spleen:  Normal in size without focal abnormality. Adrenals/Urinary Tract: Stable appearance of the adrenal glands without gross abnormality. Again noted are bilateral renal cysts particularly involving the right kidney upper pole. No hydronephrosis. Normal appearance of the urinary bladder. Stomach/Bowel: Moderate amount of stool throughout the colon particularly in the rectum. Scattered colonic diverticula without acute bowel inflammation. Normal appearance of the small bowel without significant dilatation. Normal appearance of the stomach. Lymphatic: Slightly prominent lymph nodes in the abdominal mesentery. Largest nodal tissue measures up to 1.3 cm in the short axis on image 07/209 and suspect this nodal tissue has slightly increased since 2019 but difficult to compare lymph nodes in this area. In addition, difficult evaluate the central mesenteric lymph nodes due to the arterial phase of contrast. There is no significant retroperitoneal lymph node enlargement. Reproductive: Patient appears to have a small but uterus poorly characterized on this examination. Left ovary/adnexa is prominent on image 7/222 measuring 2.2 cm in the short axis and previously measured 1.9 cm in 2019. No gross abnormality to the right adnexa. Other: Negative for free fluid.  No evidence for free air. Musculoskeletal: Surgical hardware in the proximal  left femur. Multilevel disc space narrowing in the lumbar spine. Mild compression deformity along the L2 superior endplate is chronic. No evidence for acute fracture. Review of the MIP images confirms the above findings. IMPRESSION: 1. Negative for aortic dissection or intramural hematoma. 2. Stable fusiform aneurysm of the ascending thoracic aorta measuring 4.2 cm. Recommend annual imaging followup by CTA or MRA. This recommendation follows 2010 ACCF/AHA/AATS/ACR/ASA/SCA/SCAI/SIR/STS/SVM Guidelines for the Diagnosis and Management of Patients with Thoracic Aortic Disease. Circulation. 2010; 121: G387-F643. Aortic aneurysm NOS (ICD10-I71.9) 3. Slightly increased dilatation of the extrahepatic bile duct with a large filling defect. Findings are most compatible with choledocholithiasis. 4. Slight enlargement of the left ovary/adnexa since 2019. In addition, there are slightly increased mesenteric lymph nodes. These findings are nonspecific and difficult to characterize due to the phase of contrast. Consider follow-up abdominopelvic CT using portal venous phase imaging to better characterize these findings. 5. Colonic diverticulosis. Electronically Signed   By: Markus Daft M.D.   On: 02/09/2022 08:26   DG Chest 2 View  Result Date: 02/09/2022 CLINICAL DATA:  88 year old female presenting with sharp chest pain radiating to the back. EXAM: CHEST - 2 VIEW COMPARISON:  July 06, 2021. FINDINGS: Cardiomediastinal contours and hilar structures are stable. Heart size mildly enlarged. No lobar consolidation.  No sign of pleural effusion. No visible pneumothorax. On limited assessment there is no acute skeletal process. IMPRESSION: Mild cardiomegaly. No acute cardiopulmonary process. Electronically Signed   By: Zetta Bills M.D.   On: 02/09/2022 08:07    EKG: Independently reviewed.  Sinus rhythm, no acute ST changes.  Assessment/Plan Principal Problem:   Chest pain Active Problems:   Angina pectoris (HCC)    Choledocholithiasis  (please populate well all problems here in Problem List. (For example, if patient is on BP meds at home and you resume or decide to hold them, it is a problem that needs to be her. Same for CAD, COPD, HLD and so on)  Chest pain -Atypical, troponin negative x2, EKG nonischemic, ACS ruled out.  Dissection rule out with CT angiogram. -Etiology likely referred pain from worsening of symptomatic choledocholithiasis.  ERCP tomorrow -Angina less likely, given the nature of the pain has been persistent and no associated other signs or symptoms.  And nitroglycerin x2 did not improve the pain.  Thoracic aneurysm size and location stable  on CT angiogram less likely to cause her symptoms.  Symptomatic choledocholithiasis -Symptomatic management, n.p.o. after midnight -ERCP tomorrow -Medical cleared for ERCP and general anesthesia with acceptable risk, as patient had hip surgery about 6 months ago with generalized anesthesia and tolerated well.  CKD stage IIIa -Renal level stable, received IV contrast today, start maintenance apolipoprotein E x1 day  Chronic diastolic CHF -Euvolemic, no acute concerns  Thoracic aneurysm -CTA negative for acute dissection  DVT prophylaxis: Heparin subcu Code Status: DNR Family Communication: Husband at bedside Disposition Plan: Expect less than 2 midnight hospital stay Consults called: [facility] Admission status: Telemetry observation   Lequita Halt MD [facility] Pager [phone]  02/09/2022, 1:20 PM

## 2022-02-09 NOTE — Progress Notes (Signed)
Revisited patient, patient reported the lidocaine patch significant improvement in her chest pain.  No indication to escalate opioid regimen.  GI consultation appreciated.

## 2022-02-09 NOTE — ED Triage Notes (Signed)
Pt to ED via EMS from home. Pt states she was woken up by central sharp chest pain radiating to back. Pt has hx of AAA in 2018. Pt has taken ASA and 2 nitro tonight. Pt denies SOB. Pt denies N/V. Pt denies weakness. Pt denies any other pain.   EMS vitals: 20 LFA 102/68 88 HR 100% RA

## 2022-02-09 NOTE — Anesthesia Preprocedure Evaluation (Addendum)
Anesthesia Evaluation  Patient identified by MRN, date of birth, ID band Patient awake    Reviewed: Allergy & Precautions, NPO status , Patient's Chart, lab work & pertinent test results  History of Anesthesia Complications Negative for: history of anesthetic complications  Airway Mallampati: II  TM Distance: >3 FB Neck ROM: Full    Dental  (+) Missing,    Pulmonary former smoker,    Pulmonary exam normal        Cardiovascular + CAD and +CHF   Rhythm:Irregular Rate:Normal  TTE 06/2021: EF 60-65%, grade II DD, mild AS, mild dilatation of ascending aorta measuring 60m   Neuro/Psych negative neurological ROS  negative psych ROS   GI/Hepatic Neg liver ROS, GERD  ,  Endo/Other  negative endocrine ROS  Renal/GU Renal InsufficiencyRenal disease  negative genitourinary   Musculoskeletal  (+) Arthritis ,   Abdominal   Peds  Hematology  (+) Blood dyscrasia (Hgb 10.2), anemia ,   Anesthesia Other Findings Day of surgery medications reviewed with patient.  Reproductive/Obstetrics negative OB ROS                           Anesthesia Physical Anesthesia Plan  ASA: 3  Anesthesia Plan: General   Post-op Pain Management: Minimal or no pain anticipated   Induction: Intravenous  PONV Risk Score and Plan: 3 and Treatment may vary due to age or medical condition, Ondansetron and Dexamethasone  Airway Management Planned: Oral ETT  Additional Equipment: None  Intra-op Plan:   Post-operative Plan: Extubation in OR  Informed Consent: I have reviewed the patients History and Physical, chart, labs and discussed the procedure including the risks, benefits and alternatives for the proposed anesthesia with the patient or authorized representative who has indicated his/her understanding and acceptance.   Patient has DNR.  Discussed DNR with patient and Continue DNR.   Dental advisory given  Plan  Discussed with: CRNA  Anesthesia Plan Comments: (DNR discussed with patient, she would like to remain DNR periprocedurally (understands that intubation is required for procedure, declines defibrillation or chest compressions).  Patient noted be in atrial fibrillation in preop with rates 80s, asymptomatic. Previous EKG with NSR. 12-lead obtained. -Daiva Huge MD)      Anesthesia Quick Evaluation

## 2022-02-10 ENCOUNTER — Encounter (HOSPITAL_COMMUNITY)
Admission: EM | Disposition: A | Discharge status: Home / Self Care | Payer: Self-pay | Source: Home / Self Care | Attending: Family Medicine

## 2022-02-10 ENCOUNTER — Observation Stay (HOSPITAL_COMMUNITY): Payer: Medicare Other | Admitting: Anesthesiology

## 2022-02-10 ENCOUNTER — Observation Stay (HOSPITAL_BASED_OUTPATIENT_CLINIC_OR_DEPARTMENT_OTHER): Payer: Medicare Other | Admitting: Anesthesiology

## 2022-02-10 ENCOUNTER — Observation Stay (HOSPITAL_COMMUNITY): Payer: Medicare Other

## 2022-02-10 ENCOUNTER — Encounter (HOSPITAL_COMMUNITY): Payer: Self-pay | Admitting: Internal Medicine

## 2022-02-10 DIAGNOSIS — Z882 Allergy status to sulfonamides status: Secondary | ICD-10-CM | POA: Diagnosis not present

## 2022-02-10 DIAGNOSIS — R079 Chest pain, unspecified: Secondary | ICD-10-CM | POA: Diagnosis not present

## 2022-02-10 DIAGNOSIS — Z66 Do not resuscitate: Secondary | ICD-10-CM | POA: Diagnosis present

## 2022-02-10 DIAGNOSIS — N1831 Chronic kidney disease, stage 3a: Secondary | ICD-10-CM | POA: Diagnosis present

## 2022-02-10 DIAGNOSIS — Z880 Allergy status to penicillin: Secondary | ICD-10-CM | POA: Diagnosis not present

## 2022-02-10 DIAGNOSIS — K8050 Calculus of bile duct without cholangitis or cholecystitis without obstruction: Secondary | ICD-10-CM

## 2022-02-10 DIAGNOSIS — I509 Heart failure, unspecified: Secondary | ICD-10-CM | POA: Diagnosis not present

## 2022-02-10 DIAGNOSIS — N838 Other noninflammatory disorders of ovary, fallopian tube and broad ligament: Secondary | ICD-10-CM

## 2022-02-10 DIAGNOSIS — K219 Gastro-esophageal reflux disease without esophagitis: Secondary | ICD-10-CM | POA: Diagnosis present

## 2022-02-10 DIAGNOSIS — N179 Acute kidney failure, unspecified: Secondary | ICD-10-CM | POA: Diagnosis present

## 2022-02-10 DIAGNOSIS — I2510 Atherosclerotic heart disease of native coronary artery without angina pectoris: Secondary | ICD-10-CM | POA: Diagnosis not present

## 2022-02-10 DIAGNOSIS — Z888 Allergy status to other drugs, medicaments and biological substances status: Secondary | ICD-10-CM | POA: Diagnosis not present

## 2022-02-10 DIAGNOSIS — D649 Anemia, unspecified: Secondary | ICD-10-CM

## 2022-02-10 DIAGNOSIS — Z79899 Other long term (current) drug therapy: Secondary | ICD-10-CM | POA: Diagnosis not present

## 2022-02-10 DIAGNOSIS — Z7982 Long term (current) use of aspirin: Secondary | ICD-10-CM | POA: Diagnosis not present

## 2022-02-10 DIAGNOSIS — E559 Vitamin D deficiency, unspecified: Secondary | ICD-10-CM | POA: Diagnosis present

## 2022-02-10 DIAGNOSIS — E7800 Pure hypercholesterolemia, unspecified: Secondary | ICD-10-CM | POA: Diagnosis present

## 2022-02-10 DIAGNOSIS — I7121 Aneurysm of the ascending aorta, without rupture: Secondary | ICD-10-CM | POA: Diagnosis present

## 2022-02-10 DIAGNOSIS — K838 Other specified diseases of biliary tract: Secondary | ICD-10-CM | POA: Diagnosis not present

## 2022-02-10 DIAGNOSIS — I4892 Unspecified atrial flutter: Secondary | ICD-10-CM

## 2022-02-10 DIAGNOSIS — I5032 Chronic diastolic (congestive) heart failure: Secondary | ICD-10-CM | POA: Diagnosis present

## 2022-02-10 DIAGNOSIS — Z87891 Personal history of nicotine dependence: Secondary | ICD-10-CM | POA: Diagnosis not present

## 2022-02-10 DIAGNOSIS — M1990 Unspecified osteoarthritis, unspecified site: Secondary | ICD-10-CM | POA: Diagnosis not present

## 2022-02-10 DIAGNOSIS — C9110 Chronic lymphocytic leukemia of B-cell type not having achieved remission: Secondary | ICD-10-CM | POA: Diagnosis present

## 2022-02-10 DIAGNOSIS — G629 Polyneuropathy, unspecified: Secondary | ICD-10-CM | POA: Diagnosis present

## 2022-02-10 HISTORY — PX: ERCP: SHX5425

## 2022-02-10 HISTORY — PX: REMOVAL OF STONES: SHX5545

## 2022-02-10 HISTORY — PX: SPHINCTEROTOMY: SHX5544

## 2022-02-10 LAB — CBC
HCT: 29.5 % — ABNORMAL LOW (ref 36.0–46.0)
Hemoglobin: 10.2 g/dL — ABNORMAL LOW (ref 12.0–15.0)
MCH: 32 pg (ref 26.0–34.0)
MCHC: 34.6 g/dL (ref 30.0–36.0)
MCV: 92.5 fL (ref 80.0–100.0)
Platelets: 275 10*3/uL (ref 150–400)
RBC: 3.19 MIL/uL — ABNORMAL LOW (ref 3.87–5.11)
RDW: 15.1 % (ref 11.5–15.5)
WBC: 13 10*3/uL — ABNORMAL HIGH (ref 4.0–10.5)
nRBC: 0 % (ref 0.0–0.2)

## 2022-02-10 LAB — HEPATIC FUNCTION PANEL
ALT: 15 U/L (ref 0–44)
AST: 20 U/L (ref 15–41)
Albumin: 3.1 g/dL — ABNORMAL LOW (ref 3.5–5.0)
Alkaline Phosphatase: 58 U/L (ref 38–126)
Bilirubin, Direct: 0.4 mg/dL — ABNORMAL HIGH (ref 0.0–0.2)
Indirect Bilirubin: 0.9 mg/dL (ref 0.3–0.9)
Total Bilirubin: 1.3 mg/dL — ABNORMAL HIGH (ref 0.3–1.2)
Total Protein: 5.4 g/dL — ABNORMAL LOW (ref 6.5–8.1)

## 2022-02-10 SURGERY — ERCP, WITH INTERVENTION IF INDICATED
Anesthesia: General

## 2022-02-10 MED ORDER — SUGAMMADEX SODIUM 200 MG/2ML IV SOLN
INTRAVENOUS | Status: DC | PRN
  Administered 2022-02-10: 125 mg via INTRAVENOUS

## 2022-02-10 MED ORDER — PHENYLEPHRINE HCL-NACL 20-0.9 MG/250ML-% IV SOLN
INTRAVENOUS | Status: DC | PRN
  Administered 2022-02-10: 25 ug/min via INTRAVENOUS

## 2022-02-10 MED ORDER — LACTATED RINGERS IV SOLN: INTRAVENOUS | Status: DC | PRN

## 2022-02-10 MED ORDER — EPHEDRINE SULFATE (PRESSORS) 50 MG/ML IJ SOLN
INTRAMUSCULAR | Status: DC | PRN
  Administered 2022-02-10: 5 mg via INTRAVENOUS

## 2022-02-10 MED ORDER — ADULT MULTIVITAMIN W/MINERALS CH: 1.0000 | ORAL_TABLET | Freq: Every day | ORAL | Status: DC

## 2022-02-10 MED ORDER — PROPOFOL 10 MG/ML IV BOLUS
INTRAVENOUS | Status: DC | PRN
  Administered 2022-02-10: 80 mg via INTRAVENOUS

## 2022-02-10 MED ORDER — MENTHOL 3 MG MT LOZG
1.0000 | LOZENGE | OROMUCOSAL | Status: DC | PRN
  Administered 2022-02-11: 3 mg via ORAL
  Filled 2022-02-10 (×2): qty 9

## 2022-02-10 MED ORDER — ROCURONIUM BROMIDE 10 MG/ML (PF) SYRINGE
PREFILLED_SYRINGE | INTRAVENOUS | Status: DC | PRN
  Administered 2022-02-10: 40 mg via INTRAVENOUS

## 2022-02-10 MED ORDER — SODIUM CHLORIDE 0.9 % IV SOLN
INTRAVENOUS | Status: DC | PRN
  Administered 2022-02-10: 40 mL

## 2022-02-10 MED ORDER — DICLOFENAC SUPPOSITORY 100 MG
RECTAL | Status: DC | PRN
  Administered 2022-02-10: 100 mg via RECTAL

## 2022-02-10 MED ORDER — LIDOCAINE 2% (20 MG/ML) 5 ML SYRINGE
INTRAMUSCULAR | Status: DC | PRN
  Administered 2022-02-10: 60 mg via INTRAVENOUS

## 2022-02-10 MED ORDER — RENA-VITE PO TABS
1.0000 | ORAL_TABLET | Freq: Every day | ORAL | Status: DC
Start: 2022-02-10 — End: 2022-02-11
  Administered 2022-02-10: 1 via ORAL
  Filled 2022-02-10: qty 1

## 2022-02-10 MED ORDER — FENTANYL CITRATE (PF) 250 MCG/5ML IJ SOLN
INTRAMUSCULAR | Status: DC | PRN
Start: 2022-02-10 — End: 2022-02-10
  Administered 2022-02-10: 50 ug via INTRAVENOUS

## 2022-02-10 MED ORDER — FENTANYL CITRATE (PF) 100 MCG/2ML IJ SOLN: 25.0000 ug | INTRAMUSCULAR | Status: DC | PRN

## 2022-02-10 MED ORDER — PHENYLEPHRINE HCL (PRESSORS) 10 MG/ML IV SOLN
INTRAVENOUS | Status: DC | PRN
  Administered 2022-02-10 (×2): 120 ug via INTRAVENOUS

## 2022-02-10 MED ORDER — DICLOFENAC SUPPOSITORY 100 MG
RECTAL | Status: AC
  Filled 2022-02-10: qty 1

## 2022-02-10 MED ORDER — ONDANSETRON HCL 4 MG/2ML IJ SOLN
INTRAMUSCULAR | Status: DC | PRN
  Administered 2022-02-10: 4 mg via INTRAVENOUS

## 2022-02-10 MED ORDER — GLUCAGON HCL RDNA (DIAGNOSTIC) 1 MG IJ SOLR
INTRAMUSCULAR | Status: AC
  Filled 2022-02-10: qty 1

## 2022-02-10 MED ORDER — INDOMETHACIN 50 MG RE SUPP
RECTAL | Status: AC
  Filled 2022-02-10: qty 2

## 2022-02-10 NOTE — Op Note (Signed)
[facility] Patient Name: Rebecca King Procedure Date : 02/10/2022 MRN: [id number] Attending MD: Clarene Essex , MD Date of Birth: 04-16-1926 CSN: [id number] Age: 88 Admit Type: Inpatient Procedure:                ERCP Indications:              Bile duct stone(s) on CT and MRI Providers:                Clarene Essex, MD, Dulcy Fanny, Luan Moore,                            Technician, Tressia Miners, CRNA Referring MD:              Medicines:                General Anesthesia Complications:            No immediate complications. Estimated Blood Loss:     Estimated blood loss: none. Procedure:                Pre-Anesthesia Assessment:                           - Prior to the procedure, a History and Physical                            was performed, and patient medications and                            allergies were reviewed. The patient's tolerance of                            previous anesthesia was also reviewed. The risks                            and benefits of the procedure and the sedation                            options and risks were discussed with the patient.                            All questions were answered, and informed consent                            was obtained. Prior Anticoagulants: The patient has                            taken Lovenox (enoxaparin), last dose was 1 day                            prior to procedure. ASA Grade Assessment: III - A                            patient with severe systemic disease. After  reviewing the risks and benefits, the patient was                            deemed in satisfactory condition to undergo the                            procedure.                           After obtaining informed consent, the scope was                            passed under direct vision. Throughout the                            procedure, the patient's blood pressure, pulse, and                             oxygen saturations were monitored continuously. The                            [id number]-Q190V ([id number] Olympus duodenoscope was                            introduced through the mouth, and used to inject                            contrast into and used to locate the major papilla.                            The ERCP was somewhat difficult due to abnormal                            anatomy. Successful completion of the procedure was                            aided by performing the maneuvers documented                            (below) in this report. The patient tolerated the                            procedure well. Scope In: Scope Out: Findings:      The major papilla was some distance away from a diverticulum. The major       papilla was normal. Deep selective cannulation was readily obtained and       there was no pancreatic duct injection or wire advancement throughout       the procedure and we proceeded with a biliary sphincterotomy was made       with a Hydratome sphincterotome using ERBE electrocautery. There was no       post-sphincterotomy bleeding. We did this until we had adequate biliary       drainage and could get the fully bowed sphincterotome easily in and out  of the duct and choledocholithiasis was found in a significantly dilated       duct. To discover objects, the biliary tree was swept initially with an       adjustable 12 to 15 mm balloon and because of the diameter of the duct       we could watch under fluoroscopy the stone not be captured by the       balloon and the 12 mm balloon passed through the distal duct multiple       times so we switched to the adjustable 15- 18 mm balloon starting at the       bifurcation. We did capture the stone with the 18 mm balloon but could       not pull it through the patent sphincterotomy site and we could not pull       the 15 or 18 mm balloon through the ampulla although some sludge was       swept from  the duct. At this point we tried the basket but were       unsuccessful in capturing the stone and we could not capture it and pull       it through opened so we switched back to the smaller balloon and on       multiple balloon pull-through's using both sizes of the 12 and 15 all       stones were removed. Nothing was found at the end of the procedure on       multiple balloon pull-through's and on occlusion cholangiogram done in       the customary fashion. And by the end of the procedure both balloons       passed readily through the patent sphincterotomy site and as mentioned       above to discover objects, the biliary tree was swept with a 2.5 cm       basket starting at the bifurcation which was done in the customary       fashion using fluoroscopy. Nothing was found as above with this maneuver       and nothing was found at the end of the procedure and there was adequate       biliary drainage and the wire and the balloon were removed and the       patient tolerated the procedure well and the scope was removed. Impression:               - The major papilla was some distance away from a                            diverticulum.                           - The major papilla appeared normal.                           - Choledocholithiasis was found. Complete removal                            was accomplished by biliary sphincterotomy and                            balloon extraction as above.                           -  A biliary sphincterotomy was performed.                           - The biliary tree was swept with multiple balloons                            at the end of the procedure and and nothing was                            found. Recommendation:           - Clear liquid diet for 6 hours. If doing well may                            have soft solids this evening                           - Continue present medications.                           - Return to GI clinic PRN.                            - Telephone GI clinic if symptomatic PRN.                           - Check liver enzymes (AST, ALT, alkaline                            phosphatase, bilirubin) and hemogram with white                            blood cell count and platelets in the morning. And                            if doing well in the a.m. and no obvious                            complications may go home from the GI standpoint Procedure Code(s):        --- Professional ---                           [phone], Esophagogastroduodenoscopy, flexible,                            transoral; diagnostic, including collection of                            specimen(s) by brushing or washing, when performed                            (separate procedure) Diagnosis Code(s):        --- Professional ---  K80.50, Calculus of bile duct without cholangitis                            or cholecystitis without obstruction CPT copyright 2019 [employer]. All rights reserved. The codes documented in this report are preliminary and upon coder review may  be revised to meet current compliance requirements. Clarene Essex, MD 02/10/2022 10:44:01 AM This report has been signed electronically. Number of Addenda: 0

## 2022-02-10 NOTE — Assessment & Plan Note (Signed)
Needs annual follow up imaging

## 2022-02-10 NOTE — Evaluation (Signed)
Physical Therapy Evaluation & Discharge Patient Details Name: Rebecca King MRN: [id number] DOB: February 16, 1926 Today's Date: 02/10/2022  History of Present Illness  Pt is a 88 y.o. female admitted 02/09/22 with worsening chest pain. Cardiac workup negative, ACS and dissection ruled out. Suspect symptomatic choledocholithiasis. S/p ERCP with removal of stones 6/30. PMH includes CLL, chronic CBD stone, HLD, CKD 3, CHF, TAA, CAD.   Clinical Impression  Patient evaluated by Physical Therapy with no further acute PT needs identified. PTA, pt mod indep ambulating with rollator, lives with husband at [place]; per pt, husband has her set-up to d/c to "skilled side" of [facility] for a few days to recover. Today, pt moving well with RW at supervision-level; noted low BP, pt asymptomatic. All education has been completed and the patient has no further questions. Acute PT is signing off. Thank you for this referral.  Orthostatic BPs Supine 81/54  Sitting 84/57  Standing 91/48  Post-ambulation 109/50     Recommendations for follow up therapy are one component of a multi-disciplinary discharge planning process, led by the attending physician.  Recommendations may be updated based on patient status, additional functional criteria and insurance authorization.  Follow Up Recommendations Home health PT - pt reports husband has set-up d/c to "skilled side" of [facility] for a few days to recover     Assistance Recommended at Discharge Intermittent Supervision/Assistance  Patient can return home with the following  A little help with bathing/dressing/bathroom;Assistance with cooking/housework;Assist for transportation;Help with stairs or ramp for entrance    Equipment Recommendations None recommended by PT  Recommendations for Other Services   Mobility Specialist   Functional Status Assessment       Precautions / Restrictions Precautions Precautions: Fall Restrictions Weight Bearing  Restrictions: No      Mobility  Bed Mobility Overal bed mobility: Independent                  Transfers Overall transfer level: Needs assistance Equipment used: None, Rolling walker (2 wheels) Transfers: Sit to/from Stand Sit to Stand: Supervision                Ambulation/Gait Ambulation/Gait assistance: Supervision Gait Distance (Feet): 290 Feet Assistive device: Rolling walker (2 wheels) Gait Pattern/deviations: Step-through pattern, Decreased stride length       General Gait Details: slow, steady gait with RW and supervision for safety; noted hypotension, pt denies symptoms  Stairs            Wheelchair Mobility    Modified Rankin (Stroke Patients Only)       Balance Overall balance assessment: Needs assistance   Sitting balance-Leahy Scale: Good     Standing balance support: No upper extremity supported, During functional activity Standing balance-Leahy Scale: Fair Standing balance comment: can static stand and take steps without UE support; preference for RW                             Pertinent Vitals/Pain Pain Assessment Pain Assessment: Faces Faces Pain Scale: Hurts a little bit Pain Location: throat post-procedure Pain Descriptors / Indicators: Other (Comment) (hoarse) Pain Intervention(s): Other (comment) (RN to provide lozenge)    Home Living Family/patient expects to be discharged to:: Assisted living Living Arrangements: Spouse/significant other Available Help at Discharge: Family;Available 24 hours/day Type of Home: Apartment Home Access: Level entry       Home Layout: One level Home Equipment: Conservation officer, nature (2 wheels);Rollator (4 wheels);Cane - single  point Additional Comments: resident at [place] with husband Franchot Mimes); plans to d/c to skilled care "for a few days to recover" which was arranged by husband    Prior Function Prior Level of Function : Independent/Modified Independent              Mobility Comments: Mod indep with rollator       Hand Dominance   Dominant Hand: Right    Extremity/Trunk Assessment   Upper Extremity Assessment Upper Extremity Assessment: Overall WFL for tasks assessed    Lower Extremity Assessment Lower Extremity Assessment: Overall WFL for tasks assessed       Communication   Communication: HOH  Cognition Arousal/Alertness: Awake/alert Behavior During Therapy: WFL for tasks assessed/performed Overall Cognitive Status: Within Functional Limits for tasks assessed                                 General Comments: endorses some short-term memory deficits baseline        General Comments General comments (skin integrity, edema, etc.): resting BP 81/54, post-walk BP 109/50; RN aware of low BP, pt asymptomatic; HR 78, SPO2 96% on RA    Exercises     Assessment/Plan    PT Assessment Patient does not need any further PT services  PT Problem List         PT Treatment Interventions      PT Goals (Current goals can be found in the Care Plan section)  Acute Rehab PT Goals PT Goal Formulation: All assessment and education complete, DC therapy    Frequency       Co-evaluation               AM-PAC PT "6 Clicks" Mobility  Outcome Measure Help needed turning from your back to your side while in a flat bed without using bedrails?: None Help needed moving from lying on your back to sitting on the side of a flat bed without using bedrails?: None Help needed moving to and from a bed to a chair (including a wheelchair)?: A Little Help needed standing up from a chair using your arms (e.g., wheelchair or bedside chair)?: A Little Help needed to walk in hospital room?: A Little Help needed climbing 3-5 steps with a railing? : A Little 6 Click Score: 20    End of Session Equipment Utilized During Treatment: Gait belt Activity Tolerance: Patient tolerated treatment well Patient left: in chair;with call bell/phone  within reach Nurse Communication: Mobility status;Other (comment) (RN ok with no chair alarm) PT Visit Diagnosis: Other abnormalities of gait and mobility (R26.89)    Time: 9983-3825 PT Time Calculation (min) (ACUTE ONLY): 19 min   Charges:   PT Evaluation $PT Eval Moderate Complexity: Gideon, PT, DPT Acute Rehabilitation Services  Personal: [facility] Office: [place] 02/10/2022, 2:12 PM

## 2022-02-10 NOTE — Assessment & Plan Note (Signed)
Due to above, ruled out for acs with negative trop x2 CTA chest abdomen pelvis without dissection or intramural hematoma

## 2022-02-10 NOTE — Progress Notes (Signed)
PROGRESS NOTE    Rebecca King  JTT:[id number] DOB: [birth year] DOA: 02/09/2022 PCP: Rebecca Manes, MD  Chief Complaint  Patient presents with   Chest Pain    Brief Narrative:  Rebecca King is Rebecca King 88 y.o. female with medical history significant of CLL, chronic CBD stone, HLD, CKD stage IIIa, chronic diastolic CHF, thoracic aortic aneurysm, CAD, presented with worsening of chest pain.   Imaging revealed evidence of choledocholithiasis.  She's now s/p ERCP.  See below for additional details    Assessment & Plan:   Principal Problem:   Chest pain Active Problems:   Angina pectoris (HCC)   Choledocholithiasis   Atrial flutter (HCC)   Thoracic ascending aortic aneurysm (HCC)   Ovarian enlargement, left   Assessment and Plan: * Chest pain Due to above, ruled out for acs with negative trop x2 CTA chest abdomen pelvis without dissection or intramural hematoma  Choledocholithiasis CT with slightly increased dilatation of extrahepatic bile duct with Rebecca King large filling defect RUQ [place] with intra/extrahepatic biliary ductal dilation (not significantly changed compared to recent CT/mRI from 10/28/2021) Zosyn per GI S/p ERCP today -> choledocholithiasis with complete removal by biliary sphincterotomy and balloon extraction (see report).  Ok with CLD x6 hrs, soft diet this evening if doing well.     Atrial flutter ([place]) This seems new, back in sinus now TSH Echo pending chadsvasc is 3, hold off on anticoagulation given recent procedure, will discuss with patient additionally   Thoracic ascending aortic aneurysm ([place]) Needs annual follow up imaging  Ovarian enlargement, left With slightly increased mesenteric LN's, nonspecific Radiology recommending repeat abdominopelvic CT using portal venous phase imaging to better char these findigns          DVT prophylaxis: lovenox Code Status: DNR Family Communication: husband at bedside Disposition:   Status is:  Inpatient Remains inpatient appropriate because: w/u afib   Consultants:  GI  Procedures:  ERCp  Antimicrobials:  Anti-infectives (From admission, onward)    Start     Dose/Rate Route Frequency Ordered Stop   02/09/22 1515  piperacillin-tazobactam (ZOSYN) IVPB 3.375 g        3.375 g 12.5 mL/hr over 240 Minutes Intravenous Every 8 hours 02/09/22 1425         Subjective: No complaints   Objective: Vitals:   02/10/22 1230 02/10/22 1245 02/10/22 1300 02/10/22 1628  BP: (!) 95/50 (!) 97/50 (!) 84/53 (!) 94/53  Pulse: 72 66 64 63  Resp: '19 19 17 16  '$ Temp:      TempSrc:      SpO2: 94% 93% 93% 96%  Weight:      Height:        Intake/Output Summary (Last 24 hours) at 02/10/2022 1902 Last data filed at 02/10/2022 1856 Gross per 24 hour  Intake 2032.42 ml  Output 5 ml  Net 2027.42 ml   Filed Weights   02/09/22 1357  Weight: 64 kg    Examination:  General exam: Appears calm and comfortable  Respiratory system: unlabored Cardiovascular system: RRR Gastrointestinal system: Abdomen is nondistended, soft and nontender.  Central nervous system: Alert and oriented. No focal neurological deficits. Extremities: no LEE   Data Reviewed: I have personally reviewed following labs and imaging studies  CBC: Recent Labs  Lab 02/09/22 0630 02/09/22 0650 02/10/22 0030  WBC 16.5*  --  13.0*  HGB 10.7* 10.2* 10.2*  HCT 31.3* 30.0* 29.5*  MCV 94.8  --  92.5  PLT 286  --  275  Basic Metabolic Panel: Recent Labs  Lab 02/09/22 0630 02/09/22 0650  NA 138 137  K 4.0 3.9  CL 107 107  CO2 21*  --   GLUCOSE 113* 109*  BUN 20 24*  CREATININE 0.96 0.90  CALCIUM 9.4  --     GFR: Estimated Creatinine Clearance: 34.4 mL/min (by C-G formula based on SCr of 0.9 mg/dL).  Liver Function Tests: Recent Labs  Lab 02/09/22 0630 02/10/22 0030  AST 25 20  ALT 12 15  ALKPHOS 68 58  BILITOT 0.8 1.3*  PROT 5.3* 5.4*  ALBUMIN 3.4* 3.1*    CBG: No results for input(s):  "GLUCAP" in the last 168 hours.   Recent Results (from the past 240 hour(s))  MRSA Next Gen by PCR, Nasal     Status: None   Collection Time: 02/09/22  2:22 PM   Specimen: Nasal Mucosa; Nasal Swab  Result Value Ref Range Status   MRSA by PCR Next Gen NOT DETECTED NOT DETECTED Final    Comment: (NOTE) The GeneXpert MRSA Assay (FDA approved for NASAL specimens only), is one component of [facility]ren[facility] Curb comprehensive MRSA colonization surveillance program. It is not intended to diagnose MRSA infection nor to guide or monitor treatment for MRSA infections. Test performance is not FDA approved in patients less than 30 years old. Performed at [facility], [place] [address] [place],  [address]          Radiology Studies: DG ERCP  Result Date: 02/10/2022 CLINICAL DATA:  ERCP. EXAM: ERCP TECHNIQUE: Multiple spot images obtained with the fluoroscopic device and submitted for interpretation post-procedure. FLUOROSCOPY TIME: FLUOROSCOPY TIME 7 minutes, 51 seconds (83.7 mGy) COMPARISON:  Right upper quadrant abdominal ultrasound-02/09/2022; chest CT-11/10/2011 FINDINGS: Three spot intraoperative fluoroscopic images the right upper abdominal quadrant during ERCP are provided for review. Initial image demonstrates an ERCP probe overlying the right upper abdominal quadrant. Surgical clips overlie the expected location of the gallbladder fossa. There is selective cannulation and opacification of the common bile duct which appears markedly dilated. There is minimal opacification of the intrahepatic biliary tree which appears mildly dilated. There is no definitive opacification of the cystic or pancreatic ducts. IMPRESSION: ERCP as above. These images were submitted for radiologic interpretation only. Please see the procedural report for the amount of contrast and the fluoroscopy time utilized. Electronically Signed   By: Sandi Mariscal M.D.   On: 02/10/2022 12:11   DG C-Arm 1-60 Min-No Report  Result Date:  02/10/2022 Fluoroscopy was utilized by the requesting physician.  No radiographic interpretation.   US Abdomen Limited RUQ (LIVER/GB)  Result Date: 02/09/2022 CLINICAL DATA:  Right upper quadrant abdominal and epigastric pain since last night EXAM: ULTRASOUND ABDOMEN LIMITED RIGHT UPPER QUADRANT COMPARISON:  Same-day CTA abdomen/pelvis, MR abdomen/pelvis 10/29/2018 FINDINGS: Gallbladder: The gallbladder is not identified, presumed surgically absent. Common bile duct: Diameter: The common bile duct is dilated measuring up to 11 mm with intrahepatic biliary ductal dilation. This is similar to the prior CT as well as the MRI from 10/28/2021. The filling defects seen on the prior CT is not identified on the current study Liver: No focal lesion identified. Within normal limits in parenchymal echogenicity. Portal vein is patent on color Doppler imaging with normal direction of blood flow towards the liver. Other: Right renal cysts are again noted. IMPRESSION: Intra and extrahepatic biliary ductal dilation is not significantly changed compared to the recent CT or MRI from 10/28/2021 but is slightly increased compared to more remote studies. The  filling defect seen on the prior CT and MRI is not seen on the current study. Electronically Signed   By: Valetta Mole M.D.   On: 02/09/2022 10:00   CT Angio Chest/Abd/Pel for Dissection W and/or W/WO  Result Date: 02/09/2022 CLINICAL DATA:  88 year old with chest and back pain. Aortic dissection is suspected. EXAM: CT ANGIOGRAPHY CHEST, ABDOMEN AND PELVIS TECHNIQUE: Non-contrast CT of the chest was initially obtained. Multidetector CT imaging through the chest, abdomen and pelvis was performed using the standard protocol during bolus administration of intravenous contrast. Multiplanar reconstructed images and MIPs were obtained and reviewed to evaluate the vascular anatomy. RADIATION DOSE REDUCTION: This exam was performed according to the departmental dose-optimization  program which includes automated exposure control, adjustment of the mA and/or kV according to patient size and/or use of iterative reconstruction technique. CONTRAST:  69m OMNIPAQUE IOHEXOL 350 MG/ML SOLN COMPARISON:  CTA chest 09/29/2021 and chest CTA 02/06/2019 and CTA chest, abdomen, pelvis 08/06/2018 FINDINGS: CTA CHEST FINDINGS Cardiovascular: Limited evaluation of the aortic root due to pulsation artifact. The ascending thoracic aorta is aneurysmal measuring 4.2 cm and stable. No evidence to suggest an aortic dissection or intramural hematoma. Great vessels are patent. Proximal descending thoracic aorta measures 2.6 cm and minimally changed. Mild atherosclerotic disease in the thoracic aorta. Although the study was designed to evaluate the thoracic aorta, there is opacification of the pulmonary arteries and no evidence for large or central pulmonary embolism. Heart size is stable. Minimal pericardial fluid is similar to the previous examination. Mediastinum/Nodes: No lymph node enlargement in the mediastinum, hila or axillary regions. Lungs/Pleura: 3 mm pleural-based nodule in the right lower lobe on 08/109 is stable since 2020. Stable focal density along the right minor fissure on image 8/86 is stable. Stable pleural-based nodularity in the right middle lobe on image 8/104. No evidence for new airspace disease or lung consolidation. No pleural effusions. Additional small nodular densities scattered throughout the lungs are stable since 2020. Musculoskeletal: Thoracic vertebral body heights are maintained. No evidence for Zetta Stoneman vertebral body compression fracture. No acute bone abnormality. Review of the MIP images confirms the above findings. CTA ABDOMEN AND PELVIS FINDINGS VASCULAR Aorta: Atherosclerotic disease in the abdominal aorta without aneurysm, dissection or significant stenosis. Celiac: Patent without evidence of aneurysm, dissection, vasculitis or significant stenosis. SMA: Mild narrowing at the  origin. No evidence for aneurysm or dissection. Renals: Both renal arteries are patent without evidence of aneurysm, dissection, vasculitis, fibromuscular dysplasia or significant stenosis. IMA: Patent without evidence of aneurysm, dissection, vasculitis or significant stenosis. Inflow: Patent without evidence of aneurysm, dissection, vasculitis or significant stenosis. 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 This is Marleah Beever normal variant. Review of the MIP images confirms the above findings. NON-VASCULAR Hepatobiliary: Stable appearance of the liver. Cholecystectomy. Again noted is dilated extrahepatic bile duct. However, there is Khylee Algeo dense filling defect within the common bile duct on image 7/166. This is most compatible with choledocholithiasis. This filling defect measures up to 1.4 cm on the coronal reformats. Common bile duct measures up to 1.8 cm which is slightly increased from 2019 when the duct measured up to 1.5 cm. There is Aarit Kashuba small amount of intrahepatic biliary dilatation which is poorly characterized. Pancreas: Unremarkable. No pancreatic ductal dilatation or surrounding inflammatory changes. Spleen: Normal in size without focal abnormality. Adrenals/Urinary Tract: Stable appearance of the adrenal glands without gross abnormality. Again noted are bilateral renal cysts particularly involving the right kidney upper pole. No  hydronephrosis. Normal appearance of the urinary bladder. Stomach/Bowel: Moderate amount of stool throughout the colon particularly in the rectum. Scattered colonic diverticula without acute bowel inflammation. Normal appearance of the small bowel without significant dilatation. Normal appearance of the stomach. Lymphatic: Slightly prominent lymph nodes in the abdominal mesentery. Largest nodal tissue measures up to 1.3 cm in the short axis on image 07/209 and suspect this nodal tissue has slightly increased since 2019 but difficult to compare lymph  nodes in this area. In addition, difficult evaluate the central mesenteric lymph nodes due to the arterial phase of contrast. There is no significant retroperitoneal lymph node enlargement. Reproductive: Patient appears to have Tienna Bienkowski small but uterus poorly characterized on this examination. Left ovary/adnexa is prominent on image 7/222 measuring 2.2 cm in the short axis and previously measured 1.9 cm in 2019. No gross abnormality to the right adnexa. Other: Negative for free fluid.  No evidence for free air. Musculoskeletal: Surgical hardware in the proximal left femur. Multilevel disc space narrowing in the lumbar spine. Mild compression deformity along the L2 superior endplate is chronic. No evidence for acute fracture. Review of the MIP images confirms the above findings. IMPRESSION: 1. Negative for aortic dissection or intramural hematoma. 2. Stable fusiform aneurysm of the ascending thoracic aorta measuring 4.2 cm. Recommend annual imaging followup by CTA or MRA. This recommendation follows 2010 ACCF/AHA/AATS/ACR/ASA/SCA/SCAI/SIR/STS/SVM Guidelines for the Diagnosis and Management of Patients with Thoracic Aortic Disease. Circulation. 2010; 121: N462-V035. Aortic aneurysm NOS (ICD10-I71.9) 3. Slightly increased dilatation of the extrahepatic bile duct with Desaree Downen large filling defect. Findings are most compatible with choledocholithiasis. 4. Slight enlargement of the left ovary/adnexa since 2019. In addition, there are slightly increased mesenteric lymph nodes. These findings are nonspecific and difficult to characterize due to the phase of contrast. Consider follow-up abdominopelvic CT using portal venous phase imaging to better characterize these findings. 5. Colonic diverticulosis. Electronically Signed   By: Markus Daft M.D.   On: 02/09/2022 08:26   DG Chest 2 View  Result Date: 02/09/2022 CLINICAL DATA:  88 year old female presenting with sharp chest pain radiating to the back. EXAM: CHEST - 2 VIEW COMPARISON:   July 06, 2021. FINDINGS: Cardiomediastinal contours and hilar structures are stable. Heart size mildly enlarged. No lobar consolidation.  No sign of pleural effusion. No visible pneumothorax. On limited assessment there is no acute skeletal process. IMPRESSION: Mild cardiomegaly. No acute cardiopulmonary process. Electronically Signed   By: Zetta Bills M.D.   On: 02/09/2022 08:07        Scheduled Meds:  acetaminophen  650 mg Oral BID   aspirin  81 mg Oral Daily   enoxaparin (LOVENOX) injection  40 mg Subcutaneous Q24H   feeding supplement  237 mL Oral BID BM   lidocaine  1 patch Transdermal Daily   multivitamin  1 tablet Oral QHS   pantoprazole  40 mg Oral QAC supper   rosuvastatin  5 mg Oral QHS   sertraline  25 mg Oral Daily   sucralfate  1 g Oral BID   Continuous Infusions:  piperacillin-tazobactam (ZOSYN)  IV 3.375 g (02/10/22 1406)     LOS: 0 days    Time spent: over 30 min    Fayrene Helper, MD [facility]   To contact the attending provider between 7A-7P or the covering provider during after hours 7P-7A, please log into the web site [email or web address] and access using universal [place] password for that web site. If you do not have the password, please call  the hospital operator.  02/10/2022, 7:02 PM

## 2022-02-10 NOTE — Assessment & Plan Note (Signed)
This seems new, back in sinus now TSH Echo with EF 60-65%, no RWMA chadsvasc is 3, hold off on anticoagulation given no prior hx, back in sinus rhythm, and occurred in setting of symptomatic choledocholithiasis.  Will send to cardiology outpatient.

## 2022-02-10 NOTE — Progress Notes (Signed)
Patient via wheelchair to Endoscopy for ERCP  Rebecca Fret, MSN, RN, [facility]

## 2022-02-10 NOTE — Anesthesia Procedure Notes (Signed)
Procedure Name: Intubation Date/Time: 02/10/2022 9:18 AM  Performed by: Glynda Jaeger, CRNAPre-anesthesia Checklist: Patient identified, Patient being monitored, Timeout performed, Emergency Drugs available and Suction available Patient Re-evaluated:Patient Re-evaluated prior to induction Oxygen Delivery Method: Circle System Utilized Preoxygenation: Pre-oxygenation with 100% oxygen Induction Type: IV induction Ventilation: Mask ventilation without difficulty Laryngoscope Size: 3 and Glidescope Grade View: Grade I Tube type: Oral Tube size: 7.0 mm Number of attempts: 1 Airway Equipment and Method: Stylet Placement Confirmation: ETT inserted through vocal cords under direct vision, positive ETCO2 and breath sounds checked- equal and bilateral Secured at: 22 cm Tube secured with: Tape Dental Injury: Teeth and Oropharynx as per pre-operative assessment

## 2022-02-10 NOTE — Hospital Course (Signed)
Rebecca King is Rebecca King 88 y.o. female with medical history significant of CLL, chronic CBD stone, HLD, CKD stage IIIa, chronic diastolic CHF, thoracic aortic aneurysm, CAD, presented with worsening of chest pain.   Imaging revealed evidence of choledocholithiasis.  She's now s/p ERCP.  Stable for discharge.  Hospitalization complicated by atrial flutter, planning for outpatient follow up.  See below for additional details

## 2022-02-10 NOTE — Plan of Care (Signed)

## 2022-02-10 NOTE — Assessment & Plan Note (Signed)
CT with slightly increased dilatation of extrahepatic bile duct with Alpheus Stiff large filling defect RUQ [place] with intra/extrahepatic biliary ductal dilation (not significantly changed compared to recent CT/mRI from 10/28/2021) Zosyn per GI S/p ERCP today -> choledocholithiasis with complete removal by biliary sphincterotomy and balloon extraction (see report).  Ok with CLD x6 hrs, soft diet this evening if doing well.

## 2022-02-10 NOTE — Anesthesia Postprocedure Evaluation (Signed)
Anesthesia Post Note  Patient: Rebecca King  Procedure(s) Performed: ENDOSCOPIC RETROGRADE CHOLANGIOPANCREATOGRAPHY (ERCP) SPHINCTEROTOMY REMOVAL OF STONES     Patient location during evaluation: PACU Anesthesia Type: General Level of consciousness: awake and alert Pain management: pain level controlled Vital Signs Assessment: post-procedure vital signs reviewed and stable Respiratory status: spontaneous breathing, nonlabored ventilation and respiratory function stable Cardiovascular status: blood pressure returned to baseline Postop Assessment: no apparent nausea or vomiting Anesthetic complications: no   No notable events documented.  Last Vitals:  Vitals:   02/10/22 1050 02/10/22 1105  BP: 103/62 (!) 102/55  Pulse: 77 81  Resp: 13 17  Temp:  (!) 36.3 C  SpO2: 96% 94%    Last Pain:  Vitals:   02/10/22 1035  TempSrc:   PainSc: 0-No pain                 Marthenia Rolling

## 2022-02-10 NOTE — Progress Notes (Signed)
Initial Nutrition Assessment  DOCUMENTATION CODES:   Not applicable  INTERVENTION:   - Ensure Enlive po BID, each supplement provides 350 kcal and 20 grams of protein  - Renal MVI daily  - Recommend Regular diet once diet advanced  - Encourage PO intake  NUTRITION DIAGNOSIS:   Increased nutrient needs related to chronic illness (CLL, CKD, CHF) as evidenced by estimated needs.  GOAL:   Patient will meet greater than or equal to 90% of their needs  MONITOR:   PO intake, Supplement acceptance, Labs, Weight trends  REASON FOR ASSESSMENT:   Malnutrition Screening Tool    ASSESSMENT:   88 year old female who presented to the ED on 6/29 with chest pain. PMH of CLL, chronic CBD stone, HLD, CKD stage IIIa, CHF, thoracic aortic aneurysm, CAD.  06/30 - s/p ERCP with findings of choledocholithiasis with complete removal by biliary sphincterotomy and balloon extraction  Attempted to speak with pt x 2. On first attempt, pt out of room in procedure. On second attempt, pt working with therapies. Unable to obtain diet and weight history at this time. Will re-attempt at follow-up.  Reviewed weight history in chart. Weight trending down over the last 6-7 months. Overall, pt with a 10.3 kg weight loss since 07/11/21. This is a 13.9% weight loss in 7 months which is significant for timeframe. Pt at risk for malnutrition and may already meet criteria but RD unable to confirm without NFPE.  Pt remains on a clear liquid diet for 6 hours post-ERCP per GI recommendations. Pt then able to have soft foods for dinner tonight per diet order. Will continue with order for Ensure Enlive BID. Will also add daily MVI with minerals.  Medications reviewed and include: Ensure Enlive BID, protonix, sucralfate, IV abx  Labs reviewed: BUN 24, ionized calcium 1.06, WBC 13.0  NUTRITION - FOCUSED PHYSICAL EXAM:  Unable to complete at this time. RD working remotely.  Diet Order:   Diet Order              Diet clear liquid Room service appropriate? Yes; Fluid consistency: Thin  Diet effective now                   EDUCATION NEEDS:   No education needs have been identified at this time  Skin:  Skin Assessment: Reviewed RN Assessment  Last BM:  no documented BM  Height:   Ht Readings from Last 1 Encounters:  02/09/22 '5\' 3"'$  (1.6 m)    Weight:   Wt Readings from Last 1 Encounters:  02/09/22 64 kg    BMI:  Body mass index is 24.98 kg/m.  Estimated Nutritional Needs:   Kcal:  1550-1750  Protein:  75-85 grams  Fluid:  1.5-1.7 L    Gustavus Bryant, MS, RD, LDN Inpatient Clinical Dietitian Please see AMiON for contact information.

## 2022-02-10 NOTE — Progress Notes (Signed)
Rebecca King 9:10 AM  Subjective: Patient seen and examined in hospital and office computer charts reviewed and she is doing better from her pain and we rediscussed the procedure and answered all of her questions Case was discussed with my partner Dr. Alessandra Bevels  Objective: Vital signs stable afebrile no acute distress in good spirits exam please see preassessment evaluation labs CT and MRI reviewed  Assessment: CBD stone  Plan: The risk benefits methods and success rate was rediscussed with the patient will proceed today with anesthesia assistance with further work-up and plans pending those findings  [facility]  office [phone] After 5PM or if no answer call [phone]

## 2022-02-10 NOTE — Assessment & Plan Note (Signed)
With slightly increased mesenteric LN's, nonspecific Radiology recommending repeat abdominopelvic CT using portal venous phase imaging to better char these findigns

## 2022-02-10 NOTE — Transfer of Care (Signed)
Immediate Anesthesia Transfer of Care Note  Patient: Rebecca King  Procedure(s) Performed: ENDOSCOPIC RETROGRADE CHOLANGIOPANCREATOGRAPHY (ERCP) SPHINCTEROTOMY REMOVAL OF STONES  Patient Location: PACU  Anesthesia Type:General  Level of Consciousness: awake, alert , patient cooperative and responds to stimulation  Airway & Oxygen Therapy: Patient Spontanous Breathing and Patient connected to face mask oxygen  Post-op Assessment: Report given to RN, Post -op Vital signs reviewed and stable and Patient moving all extremities X 4  Post vital signs: Reviewed and stable  Last Vitals:  Vitals Value Taken Time  BP 136/66 02/10/22 1035  Temp 36.3 C 02/10/22 1035  Pulse 81 02/10/22 1040  Resp 10 02/10/22 1040  SpO2 98 % 02/10/22 1040  Vitals shown include unvalidated device data.  Last Pain:  Vitals:   02/10/22 1035  TempSrc:   PainSc: 0-No pain         Complications: No notable events documented.

## 2022-02-11 ENCOUNTER — Inpatient Hospital Stay (HOSPITAL_COMMUNITY): Payer: Medicare Other

## 2022-02-11 DIAGNOSIS — R079 Chest pain, unspecified: Secondary | ICD-10-CM | POA: Diagnosis not present

## 2022-02-11 DIAGNOSIS — I4892 Unspecified atrial flutter: Secondary | ICD-10-CM | POA: Diagnosis not present

## 2022-02-11 DIAGNOSIS — N179 Acute kidney failure, unspecified: Secondary | ICD-10-CM

## 2022-02-11 LAB — ECHOCARDIOGRAM COMPLETE
AR max vel: 2.73 cm2
AV Area VTI: 2.37 cm2
AV Area mean vel: 2.6 cm2
AV Mean grad: 8.3 mmHg
AV Peak grad: 15.3 mmHg
Ao pk vel: 1.95 m/s
Area-P 1/2: 4.06 cm2
Calc EF: 70 %
Height: 63 in
S' Lateral: 1.8 cm
Single Plane A2C EF: 68.7 %
Single Plane A4C EF: 70.5 %
Weight: 2256 oz

## 2022-02-11 LAB — COMPREHENSIVE METABOLIC PANEL
ALT: 14 U/L (ref 0–44)
AST: 18 U/L (ref 15–41)
Albumin: 2.9 g/dL — ABNORMAL LOW (ref 3.5–5.0)
Alkaline Phosphatase: 49 U/L (ref 38–126)
Anion gap: 7 (ref 5–15)
BUN: 20 mg/dL (ref 8–23)
CO2: 24 mmol/L (ref 22–32)
Calcium: 9.5 mg/dL (ref 8.9–10.3)
Chloride: 108 mmol/L (ref 98–111)
Creatinine, Ser: 1.25 mg/dL — ABNORMAL HIGH (ref 0.44–1.00)
GFR, Estimated: 40 mL/min — ABNORMAL LOW (ref >60)
Glucose, Bld: 108 mg/dL — ABNORMAL HIGH (ref 70–99)
Potassium: 3.7 mmol/L (ref 3.5–5.1)
Sodium: 139 mmol/L (ref 135–145)
Total Bilirubin: 0.9 mg/dL (ref 0.3–1.2)
Total Protein: 5.2 g/dL — ABNORMAL LOW (ref 6.5–8.1)

## 2022-02-11 LAB — CBC WITH DIFFERENTIAL/PLATELET
Abs Immature Granulocytes: 0.03 10*3/uL (ref 0.00–0.07)
Basophils Absolute: 0 10*3/uL (ref 0.0–0.1)
Basophils Relative: 0 %
Eosinophils Absolute: 0.1 10*3/uL (ref 0.0–0.5)
Eosinophils Relative: 1 %
HCT: 28.5 % — ABNORMAL LOW (ref 36.0–46.0)
Hemoglobin: 9.6 g/dL — ABNORMAL LOW (ref 12.0–15.0)
Immature Granulocytes: 0 %
Lymphocytes Relative: 51 %
Lymphs Abs: 5.6 10*3/uL — ABNORMAL HIGH (ref 0.7–4.0)
MCH: 31.6 pg (ref 26.0–34.0)
MCHC: 33.7 g/dL (ref 30.0–36.0)
MCV: 93.8 fL (ref 80.0–100.0)
Monocytes Absolute: 1.2 10*3/uL — ABNORMAL HIGH (ref 0.1–1.0)
Monocytes Relative: 11 %
Neutro Abs: 4.2 10*3/uL (ref 1.7–7.7)
Neutrophils Relative %: 37 %
Platelets: 272 10*3/uL (ref 150–400)
RBC: 3.04 MIL/uL — ABNORMAL LOW (ref 3.87–5.11)
RDW: 15.3 % (ref 11.5–15.5)
Smear Review: NORMAL
WBC: 11.2 10*3/uL — ABNORMAL HIGH (ref 4.0–10.5)
nRBC: 0 % (ref 0.0–0.2)

## 2022-02-11 LAB — TSH: TSH: 2.244 u[IU]/mL (ref 0.350–4.500)

## 2022-02-11 MED ORDER — AMOXICILLIN-POT CLAVULANATE 875-125 MG PO TABS: 1.0000 | ORAL_TABLET | Freq: Two times a day (BID) | ORAL | 0 refills | Status: DC

## 2022-02-11 MED ORDER — AMOXICILLIN-POT CLAVULANATE 500-125 MG PO TABS: 1.0000 | ORAL_TABLET | Freq: Two times a day (BID) | ORAL | 0 refills | Status: AC

## 2022-02-11 NOTE — Progress Notes (Signed)
Rebecca King 3:14 PM  Subjective: Patient seen and examined and case discussed with the primary hospital doctor as well as her husband and she is in good spirits ready to go home no ERCP problem and we answered all of their questions  Objective: Vital signs stable afebrile no acute distress abdomen is soft nontender chemistries okay White count slight decrease minimal hemoglobin drop  Assessment: Status post ERCP sphincterotomy for CBD stone and removal  Plan: Happy to see back as needed and then will follow-up if her mid epigastric atypical chest pain episodes continue otherwise we will be on standby to help as needed and agree with a few more days of antibiotics just to be sure and if needed would hold blood thinners for a few days because of significant sphincterotomy done at time of procedure  [facility] E  office [phone] After 5PM or if no answer call [phone]

## 2022-02-11 NOTE — Progress Notes (Signed)
  Echocardiogram 2D Echocardiogram has been performed.  Rebecca King 02/11/2022, 9:31 AM

## 2022-02-11 NOTE — Discharge Summary (Addendum)
Physician Discharge Summary  ANEISHA SKYLES ZHG:[id number] DOB: 1925/11/13 DOA: 02/09/2022  PCP: [name], MD  Admit date: 02/09/2022 Discharge date: 02/11/2022  Time spent: 40 minutes  Recommendations for Outpatient Follow-up:  Follow outpatient CBC/CMP  Follow with GI as needed outpatient Follow with cardiology outpatient for brief fib/flutter noted during admission.  No anticoagulation at this time due to brief episode with symptomatic choledocholithiasis.  Needs follow up imaging for left ovarian enlargement and slightly increased mesenteric LN's -> repeat CT abd/pelvis with portal venous phase imaging. Aortic aneurysm, follow outpatient   Discharge Diagnoses:  Principal Problem:   Chest pain Active Problems:   Angina pectoris (HCC)   Choledocholithiasis   Atrial flutter (HCC)   Thoracic ascending aortic aneurysm (HCC)   Ovarian enlargement, left   AKI (acute kidney injury) ([place])   CLL (chronic lymphocytic leukemia) ([place])   Discharge Condition: stable  Diet recommendation: regular as tolerated  Filed Weights   02/09/22 1357  Weight: 64 kg    History of present illness:  SKILAR MARCOU is Raylyn Speckman 88 y.o. female with medical history significant of CLL, chronic CBD stone, HLD, CKD stage IIIa, chronic diastolic CHF, thoracic aortic aneurysm, CAD, presented with worsening of chest pain.   Imaging revealed evidence of choledocholithiasis.  She's now s/p ERCP.  Stable for discharge.  Hospitalization complicated by atrial flutter, planning for outpatient follow up.  See below for additional details  Hospital Course:  Assessment and Plan: * Chest pain Due to above, ruled out for acs with negative trop x2 CTA chest abdomen pelvis without dissection or intramural hematoma  Choledocholithiasis CT with slightly increased dilatation of extrahepatic bile duct with Camarie Mctigue large filling defect RUQ [place] with intra/extrahepatic biliary ductal dilation (not significantly changed compared  to recent CT/mRI from 10/28/2021) Zosyn per GI -> will discharge with another 2 days of augmentin for 5 day course S/p ERCP today -> choledocholithiasis with complete removal by biliary sphincterotomy and balloon extraction (see report).  Plan for discharge today.  Appreciate GI assistance.     Atrial flutter ([place]) This seems new, back in sinus now TSH Echo with EF 60-65%, no RWMA chadsvasc is 3, hold off on anticoagulation given no prior hx, back in sinus rhythm, and occurred in setting of symptomatic choledocholithiasis.  Will send to cardiology outpatient.  Thoracic ascending aortic aneurysm ([place]) Needs annual follow up imaging  Ovarian enlargement, left With slightly increased mesenteric LN's, nonspecific Radiology recommending repeat abdominopelvic CT using portal venous phase imaging to better char these findigns  AKI (acute kidney injury) ([place]) Mild, follow outpatient   CLL (chronic lymphocytic leukemia) ([place]) Follows with Dr. Irene Limbo Smudge cells noted      Procedures: Echo IMPRESSIONS     1. Left ventricular ejection fraction, by estimation, is 60 to 65%. The  left ventricle has normal function. The left ventricle has no regional  wall motion abnormalities. There is mild asymmetric left ventricular  hypertrophy of the basal and septal  segments. Left ventricular diastolic parameters were normal.   2. Right ventricular systolic function is normal. The right ventricular  size is normal. There is mildly elevated pulmonary artery systolic  pressure.   3. The mitral valve is abnormal. Trivial mitral valve regurgitation. No  evidence of mitral stenosis.   4. No significant change in gradients since 07/08/21 . The aortic valve  is tricuspid. There is moderate calcification of the aortic valve. There  is moderate thickening of the aortic valve. Aortic valve regurgitation is  trivial.  Mild aortic valve  stenosis.   5. Aortic dilatation noted. There is moderate  dilatation of the ascending  aorta, measuring 45 mm.   6. The inferior vena cava is normal in size with greater than 50%  respiratory variability, suggesting right atrial pressure of 3 mmHg.   ERCP - The major papilla was some distance away from Armarion Greek diverticulum. - The major papilla appeared normal. - Choledocholithiasis was found. Complete removal was accomplished by biliary sphincterotomy and balloon extraction as above. - Odella Appelhans biliary sphincterotomy was performed. - The biliary tree was swept with multiple balloons at the end of the procedure and and nothing was found. Impression: - Clear liquid diet for 6 hours. If doing well may have soft solids this evening - Continue present medications. - Return to GI clinic PRN. - Telephone GI clinic if symptomatic PRN. - Check liver enzymes (AST, ALT, alkaline phosphatase, bilirubin) and hemogram with white blood cell count and platelets in the morning. And if doing well in the Donicia Druck.m. and no obvious complications may go home from the GI standpoint  Consultations: GI Discussed with cardiology over phone  Discharge Exam: Vitals:   02/11/22 0938 02/11/22 1136  BP: 110/70 (!) 111/97  Pulse:  70  Resp: 20 15  Temp: 98.4 F (36.9 C) 98.2 F (36.8 C)  SpO2:  97%   No complaints Discussed with husband Discharge plan  General: No acute distress. Cardiovascular: RRR Lungs: unlabored Abdomen: Soft, nontender, nondistended  Neurological: Alert and oriented 3. Moves all extremities 4 with equal strength. Cranial nerves II through XII grossly intact. Skin: Warm and dry. No rashes or lesions. Extremities: No clubbing or cyanosis. No edema.   Discharge Instructions   Discharge Instructions     Ambulatory referral to Cardiology   Complete by: As directed    Call MD for:  difficulty breathing, headache or visual disturbances   Complete by: As directed    Call MD for:  extreme fatigue   Complete by: As directed    Call MD for:  hives    Complete by: As directed    Call MD for:  persistant dizziness or light-headedness   Complete by: As directed    Call MD for:  persistant nausea and vomiting   Complete by: As directed    Call MD for:  redness, tenderness, or signs of infection (pain, swelling, redness, odor or green/yellow discharge around incision site)   Complete by: As directed    Call MD for:  severe uncontrolled pain   Complete by: As directed    Call MD for:  temperature >100.4   Complete by: As directed    Diet - low sodium heart healthy   Complete by: As directed    Discharge instructions   Complete by: As directed    You were seen for choledocholithiasis.   You've been treated with antibiotics and an ERCP by gastroenterology.  We'll send you home with another 2 days of antibiotics.   You'll transition to the skilled level of care at [facility].  Follow with [facility] to determine if you'll need any home heath services after that.  You had Mahli Glahn brief period of atrial fibrillation.   Sometimes we put folks with atrial fibrillation on Sidnee Gambrill blood thinner, but because your episode was brief and you're back in Jaslyn Bansal normal rhythm, we'll hold off on any blood thinner at this time and let you follow with cardiology outpatient.    You had some imaging findings on CT scan that  should be followed up with your PCP as an outpatient.  They saw Nastashia Gallo stable ascending aortic aneurysm that needs annual follow up imaging.    They also saw enlargement of the left ovary/adnexa.  This needs follow up with repeat imaging.  Ask your PCP about scheduling this follow up imaging study (Shayla Heming CT scan of the abdomen/pelvis with "portal venous phase imaging").    Return for new, recurrent, or worsening symptoms.  Please ask your PCP to request records from this hospitalization so they know what was done and what the next steps will be.   Increase activity slowly   Complete by: As directed       Allergies as of 02/11/2022       Reactions    Amoxicillin Other (See Comments)   HEADACHES Other reaction(s): Headache   Macrolides And Ketolides    Other reaction(s): Unknown   Metoprolol    Other reaction(s): hypotension   Ciprofloxacin Nausea Only, Rash   And tingling lips and itching   Macrodantin [nitrofurantoin] Rash   Sulfa Antibiotics Nausea Only, Rash   And tingling lips and itching Other reaction(s): Unknown        Medication List     TAKE these medications    acetaminophen 650 MG CR tablet Commonly known as: TYLENOL Take 650 mg by mouth 2 (two) times daily.   ALIGN PO Take 1 capsule by mouth daily at 12 noon.   ALPRAZolam 0.25 MG tablet Commonly known as: XANAX Take 1 tablet (0.25 mg total) by mouth 2 (two) times daily as needed for anxiety.   amoxicillin-clavulanate 500-125 MG tablet Commonly known as: Augmentin Take 1 tablet (500 mg total) by mouth 2 (two) times daily for 2 days.   aspirin 81 MG tablet Take 81 mg by mouth daily.   CO Q 10 PO Take 1 tablet by mouth daily.   Denta 5000 Plus 1.1 % Crea dental cream Generic drug: sodium fluoride Place 1 Application onto teeth at bedtime.   docusate sodium 100 MG capsule Commonly known as: COLACE Take 100 mg by mouth 2 (two) times daily as needed for mild constipation. Hold for loose stools   MOVE FREE ULTRA JOINT HEALTH PO Take 1 tablet by mouth daily.   multivitamin with minerals Tabs tablet Take 1 tablet by mouth daily.   Nitrostat 0.4 MG SL tablet Generic drug: nitroGLYCERIN DISSOLVE ONE TABLET UNDERTONGUE AS DIRECTED AS    NEEDED FOR CHEST PAIN What changed: See the new instructions.   pantoprazole 40 MG tablet Commonly known as: PROTONIX Take 40 mg by mouth daily before supper.   polyethylene glycol 17 g packet Commonly known as: MIRALAX / GLYCOLAX Take 17 g by mouth daily. What changed:  when to take this reasons to take this   rosuvastatin 5 MG tablet Commonly known as: CRESTOR Take 5 mg by mouth at bedtime.    sertraline 25 MG tablet Commonly known as: ZOLOFT Take 25 mg by mouth daily. Taking QOD   sucralfate 1 g tablet Commonly known as: CARAFATE Take 1 g by mouth 2 (two) times daily.   traMADol 50 MG tablet Commonly known as: ULTRAM Take 50 mg by mouth every 12 (twelve) hours as needed for moderate pain.       Allergies  Allergen Reactions   Amoxicillin Other (See Comments)    HEADACHES Other reaction(s): Headache   Macrolides And Ketolides     Other reaction(s): Unknown   Metoprolol     Other reaction(s): hypotension  Ciprofloxacin Nausea Only and Rash    And tingling lips and itching   Macrodantin [Nitrofurantoin] Rash   Sulfa Antibiotics Nausea Only and Rash    And tingling lips and itching Other reaction(s): Unknown      The results of significant diagnostics from this hospitalization (including imaging, microbiology, ancillary and laboratory) are listed below for reference.    Significant Diagnostic Studies: ECHOCARDIOGRAM COMPLETE  Result Date: 02/11/2022    ECHOCARDIOGRAM REPORT   Patient Name:   JONE PANEBIANCO Date of Exam: 02/11/2022 Medical Rec #:  [id number]      Height:       63.0 in Accession #:    [id number]     Weight:       141.0 lb Date of Birth:  Apr 13, 1926      BSA:          1.667 m Patient Age:    88 years       BP:           138/86 mmHg Patient Gender: F              HR:           87 bpm. Exam Location:  Inpatient Procedure: 2D Echo, Cardiac Doppler and Color Doppler Indications:    I48.92* Unspecified atrial flutter  History:        Patient has prior history of Echocardiogram examinations, most                 recent 07/08/2021. CHF, CAD, Abnormal ECG, Arrythmias:Atrial                 Flutter, Signs/Symptoms:Chest Pain; Risk Factors:Dyslipidemia.  Sonographer:    Roseanna Rainbow RDCS Referring Phys: Kollen Armenti CALDWELL POWELL JR IMPRESSIONS  1. Left ventricular ejection fraction, by estimation, is 60 to 65%. The left ventricle has normal function. The left ventricle has no  regional wall motion abnormalities. There is mild asymmetric left ventricular hypertrophy of the basal and septal segments. Left ventricular diastolic parameters were normal.  2. Right ventricular systolic function is normal. The right ventricular size is normal. There is mildly elevated pulmonary artery systolic pressure.  3. The mitral valve is abnormal. Trivial mitral valve regurgitation. No evidence of mitral stenosis.  4. No significant change in gradients since 07/08/21 . The aortic valve is tricuspid. There is moderate calcification of the aortic valve. There is moderate thickening of the aortic valve. Aortic valve regurgitation is trivial. Mild aortic valve stenosis.  5. Aortic dilatation noted. There is moderate dilatation of the ascending aorta, measuring 45 mm.  6. The inferior vena cava is normal in size with greater than 50% respiratory variability, suggesting right atrial pressure of 3 mmHg. FINDINGS  Left Ventricle: Left ventricular ejection fraction, by estimation, is 60 to 65%. The left ventricle has normal function. The left ventricle has no regional wall motion abnormalities. The left ventricular internal cavity size was normal in size. There is  mild asymmetric left ventricular hypertrophy of the basal and septal segments. Left ventricular diastolic parameters were normal. Right Ventricle: The right ventricular size is normal. No increase in right ventricular wall thickness. Right ventricular systolic function is normal. There is mildly elevated pulmonary artery systolic pressure. The tricuspid regurgitant velocity is 2.68  m/s, and with an assumed right atrial pressure of 8 mmHg, the estimated right ventricular systolic pressure is 17.6 mmHg. Left Atrium: Left atrial size was normal in size. Right Atrium: Right atrial size was normal in size. Pericardium:  There is no evidence of pericardial effusion. Mitral Valve: The mitral valve is abnormal. There is moderate thickening of the mitral valve  leaflet(s). There is moderate calcification of the mitral valve leaflet(s). Mild mitral annular calcification. Trivial mitral valve regurgitation. No evidence of  mitral valve stenosis. Tricuspid Valve: The tricuspid valve is normal in structure. Tricuspid valve regurgitation is mild . No evidence of tricuspid stenosis. Aortic Valve: No significant change in gradients since 07/08/21. The aortic valve is tricuspid. There is moderate calcification of the aortic valve. There is moderate thickening of the aortic valve. Aortic valve regurgitation is trivial. Mild aortic stenosis is present. Aortic valve mean gradient measures 8.3 mmHg. Aortic valve peak gradient measures 15.3 mmHg. Aortic valve area, by VTI measures 2.37 cm. Pulmonic Valve: The pulmonic valve was normal in structure. Pulmonic valve regurgitation is mild. No evidence of pulmonic stenosis. Aorta: Aortic dilatation noted. There is moderate dilatation of the ascending aorta, measuring 45 mm. Venous: The inferior vena cava is normal in size with greater than 50% respiratory variability, suggesting right atrial pressure of 3 mmHg. IAS/Shunts: No atrial level shunt detected by color flow Doppler.  LEFT VENTRICLE PLAX 2D LVIDd:         2.90 cm     Diastology LVIDs:         1.80 cm     LV e' medial:    6.48 cm/s LV PW:         0.90 cm     LV E/e' medial:  13.8 LV IVS:        1.20 cm     LV e' lateral:   7.62 cm/s LVOT diam:     2.00 cm     LV E/e' lateral: 11.7 LV SV:         87 LV SV Index:   52 LVOT Area:     3.14 cm  LV Volumes (MOD) LV vol d, MOD A2C: 64.2 ml LV vol d, MOD A4C: 73.8 ml LV vol s, MOD A2C: 20.1 ml LV vol s, MOD A4C: 21.8 ml LV SV MOD A2C:     44.1 ml LV SV MOD A4C:     73.8 ml LV SV MOD BP:      48.6 ml RIGHT VENTRICLE             IVC RV S prime:     15.20 cm/s  IVC diam: 1.20 cm TAPSE (M-mode): 1.8 cm LEFT ATRIUM           Index        RIGHT ATRIUM           Index LA diam:      2.50 cm 1.50 cm/m   RA Area:     13.00 cm LA Vol (A2C): 19.9  ml 11.94 ml/m  RA Volume:   27.90 ml  16.74 ml/m LA Vol (A4C): 15.5 ml 9.30 ml/m  AORTIC VALVE                     PULMONIC VALVE AV Area (Vmax):    2.73 cm      PR End Diast Vel: 1.65 msec AV Area (Vmean):   2.60 cm AV Area (VTI):     2.37 cm AV Vmax:           195.33 cm/s AV Vmean:          130.667 cm/s AV VTI:            0.367  m AV Peak Grad:      15.3 mmHg AV Mean Grad:      8.3 mmHg LVOT Vmax:         170.00 cm/s LVOT Vmean:        108.000 cm/s LVOT VTI:          0.277 m LVOT/AV VTI ratio: 0.76  AORTA Ao Root diam: 3.40 cm Ao Asc diam:  4.40 cm MITRAL VALVE               TRICUSPID VALVE MV Area (PHT): 4.06 cm    TR Peak grad:   28.7 mmHg MV Decel Time: 187 msec    TR Vmax:        268.00 cm/s MV E velocity: 89.20 cm/s MV Jayra Choyce velocity: 77.10 cm/s  SHUNTS MV E/Eniola Cerullo ratio:  1.16        Systemic VTI:  0.28 m                            Systemic Diam: 2.00 cm Jenkins Rouge MD Electronically signed by Jenkins Rouge MD Signature Date/Time: 02/11/2022/11:35:11 AM    Final    DG ERCP  Result Date: 02/10/2022 CLINICAL DATA:  ERCP. EXAM: ERCP TECHNIQUE: Multiple spot images obtained with the fluoroscopic device and submitted for interpretation post-procedure. FLUOROSCOPY TIME: FLUOROSCOPY TIME 7 minutes, 51 seconds (83.7 mGy) COMPARISON:  Right upper quadrant abdominal ultrasound-02/09/2022; chest CT-11/10/2011 FINDINGS: Three spot intraoperative fluoroscopic images the right upper abdominal quadrant during ERCP are provided for review. Initial image demonstrates an ERCP probe overlying the right upper abdominal quadrant. Surgical clips overlie the expected location of the gallbladder fossa. There is selective cannulation and opacification of the common bile duct which appears markedly dilated. There is minimal opacification of the intrahepatic biliary tree which appears mildly dilated. There is no definitive opacification of the cystic or pancreatic ducts. IMPRESSION: ERCP as above. These images were submitted for  radiologic interpretation only. Please see the procedural report for the amount of contrast and the fluoroscopy time utilized. Electronically Signed   By: Sandi Mariscal M.D.   On: 02/10/2022 12:11   DG C-Arm 1-60 Min-No Report  Result Date: 02/10/2022 Fluoroscopy was utilized by the requesting physician.  No radiographic interpretation.   US Abdomen Limited RUQ (LIVER/GB)  Result Date: 02/09/2022 CLINICAL DATA:  Right upper quadrant abdominal and epigastric pain since last night EXAM: ULTRASOUND ABDOMEN LIMITED RIGHT UPPER QUADRANT COMPARISON:  Same-day CTA abdomen/pelvis, MR abdomen/pelvis 10/29/2018 FINDINGS: Gallbladder: The gallbladder is not identified, presumed surgically absent. Common bile duct: Diameter: The common bile duct is dilated measuring up to 11 mm with intrahepatic biliary ductal dilation. This is similar to the prior CT as well as the MRI from 10/28/2021. The filling defects seen on the prior CT is not identified on the current study Liver: No focal lesion identified. Within normal limits in parenchymal echogenicity. Portal vein is patent on color Doppler imaging with normal direction of blood flow towards the liver. Other: Right renal cysts are again noted. IMPRESSION: Intra and extrahepatic biliary ductal dilation is not significantly changed compared to the recent CT or MRI from 10/28/2021 but is slightly increased compared to more remote studies. The filling defect seen on the prior CT and MRI is not seen on the current study. Electronically Signed   By: Valetta Mole M.D.   On: 02/09/2022 10:00   CT Angio Chest/Abd/Pel for Dissection W and/or W/WO  Result Date: 02/09/2022  CLINICAL DATA:  88 year old with chest and back pain. Aortic dissection is suspected. EXAM: CT ANGIOGRAPHY CHEST, ABDOMEN AND PELVIS TECHNIQUE: Non-contrast CT of the chest was initially obtained. Multidetector CT imaging through the chest, abdomen and pelvis was performed using the standard protocol during bolus  administration of intravenous contrast. Multiplanar reconstructed images and MIPs were obtained and reviewed to evaluate the vascular anatomy. RADIATION DOSE REDUCTION: This exam was performed according to the departmental dose-optimization program which includes automated exposure control, adjustment of the mA and/or kV according to patient size and/or use of iterative reconstruction technique. CONTRAST:  27m OMNIPAQUE IOHEXOL 350 MG/ML SOLN COMPARISON:  CTA chest 09/29/2021 and chest CTA 02/06/2019 and CTA chest, abdomen, pelvis 08/06/2018 FINDINGS: CTA CHEST FINDINGS Cardiovascular: Limited evaluation of the aortic root due to pulsation artifact. The ascending thoracic aorta is aneurysmal measuring 4.2 cm and stable. No evidence to suggest an aortic dissection or intramural hematoma. Great vessels are patent. Proximal descending thoracic aorta measures 2.6 cm and minimally changed. Mild atherosclerotic disease in the thoracic aorta. Although the study was designed to evaluate the thoracic aorta, there is opacification of the pulmonary arteries and no evidence for large or central pulmonary embolism. Heart size is stable. Minimal pericardial fluid is similar to the previous examination. Mediastinum/Nodes: No lymph node enlargement in the mediastinum, hila or axillary regions. Lungs/Pleura: 3 mm pleural-based nodule in the right lower lobe on 08/109 is stable since 2020. Stable focal density along the right minor fissure on image 8/86 is stable. Stable pleural-based nodularity in the right middle lobe on image 8/104. No evidence for new airspace disease or lung consolidation. No pleural effusions. Additional small nodular densities scattered throughout the lungs are stable since 2020. Musculoskeletal: Thoracic vertebral body heights are maintained. No evidence for Kelda Azad vertebral body compression fracture. No acute bone abnormality. Review of the MIP images confirms the above findings. CTA ABDOMEN AND PELVIS FINDINGS  VASCULAR Aorta: Atherosclerotic disease in the abdominal aorta without aneurysm, dissection or significant stenosis. Celiac: Patent without evidence of aneurysm, dissection, vasculitis or significant stenosis. SMA: Mild narrowing at the origin. No evidence for aneurysm or dissection. Renals: Both renal arteries are patent without evidence of aneurysm, dissection, vasculitis, fibromuscular dysplasia or significant stenosis. IMA: Patent without evidence of aneurysm, dissection, vasculitis or significant stenosis. Inflow: Patent without evidence of aneurysm, dissection, vasculitis or significant stenosis. 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 This is Dontavian Marchi normal variant. Review of the MIP images confirms the above findings. NON-VASCULAR Hepatobiliary: Stable appearance of the liver. Cholecystectomy. Again noted is dilated extrahepatic bile duct. However, there is Mallori Araque dense filling defect within the common bile duct on image 7/166. This is most compatible with choledocholithiasis. This filling defect measures up to 1.4 cm on the coronal reformats. Common bile duct measures up to 1.8 cm which is slightly increased from 2019 when the duct measured up to 1.5 cm. There is Bellamy Rubey small amount of intrahepatic biliary dilatation which is poorly characterized. Pancreas: Unremarkable. No pancreatic ductal dilatation or surrounding inflammatory changes. Spleen: Normal in size without focal abnormality. Adrenals/Urinary Tract: Stable appearance of the adrenal glands without gross abnormality. Again noted are bilateral renal cysts particularly involving the right kidney upper pole. No hydronephrosis. Normal appearance of the urinary bladder. Stomach/Bowel: Moderate amount of stool throughout the colon particularly in the rectum. Scattered colonic diverticula without acute bowel inflammation. Normal appearance of the small bowel without significant dilatation. Normal appearance of the stomach.  Lymphatic: Slightly prominent  lymph nodes in the abdominal mesentery. Largest nodal tissue measures up to 1.3 cm in the short axis on image 07/209 and suspect this nodal tissue has slightly increased since 2019 but difficult to compare lymph nodes in this area. In addition, difficult evaluate the central mesenteric lymph nodes due to the arterial phase of contrast. There is no significant retroperitoneal lymph node enlargement. Reproductive: Patient appears to have Nikholas Geffre small but uterus poorly characterized on this examination. Left ovary/adnexa is prominent on image 7/222 measuring 2.2 cm in the short axis and previously measured 1.9 cm in 2019. No gross abnormality to the right adnexa. Other: Negative for free fluid.  No evidence for free air. Musculoskeletal: Surgical hardware in the proximal left femur. Multilevel disc space narrowing in the lumbar spine. Mild compression deformity along the L2 superior endplate is chronic. No evidence for acute fracture. Review of the MIP images confirms the above findings. IMPRESSION: 1. Negative for aortic dissection or intramural hematoma. 2. Stable fusiform aneurysm of the ascending thoracic aorta measuring 4.2 cm. Recommend annual imaging followup by CTA or MRA. This recommendation follows 2010 ACCF/AHA/AATS/ACR/ASA/SCA/SCAI/SIR/STS/SVM Guidelines for the Diagnosis and Management of Patients with Thoracic Aortic Disease. Circulation. 2010; 121: O671-I458. Aortic aneurysm NOS (ICD10-I71.9) 3. Slightly increased dilatation of the extrahepatic bile duct with Kooper Godshall large filling defect. Findings are most compatible with choledocholithiasis. 4. Slight enlargement of the left ovary/adnexa since 2019. In addition, there are slightly increased mesenteric lymph nodes. These findings are nonspecific and difficult to characterize due to the phase of contrast. Consider follow-up abdominopelvic CT using portal venous phase imaging to better characterize these findings. 5. Colonic  diverticulosis. Electronically Signed   By: Markus Daft M.D.   On: 02/09/2022 08:26   DG Chest 2 View  Result Date: 02/09/2022 CLINICAL DATA:  88 year old female presenting with sharp chest pain radiating to the back. EXAM: CHEST - 2 VIEW COMPARISON:  July 06, 2021. FINDINGS: Cardiomediastinal contours and hilar structures are stable. Heart size mildly enlarged. No lobar consolidation.  No sign of pleural effusion. No visible pneumothorax. On limited assessment there is no acute skeletal process. IMPRESSION: Mild cardiomegaly. No acute cardiopulmonary process. Electronically Signed   By: Zetta Bills M.D.   On: 02/09/2022 08:07    Microbiology: Recent Results (from the past 240 hour(s))  MRSA Next Gen by PCR, Nasal     Status: None   Collection Time: 02/09/22  2:22 PM   Specimen: Nasal Mucosa; Nasal Swab  Result Value Ref Range Status   MRSA by PCR Next Gen NOT DETECTED NOT DETECTED Final    Comment: (NOTE) The GeneXpert MRSA Assay (FDA approved for NASAL specimens only), is one component of [facility] comprehensive MRSA colonization surveillance program. It is not intended to diagnose MRSA infection nor to guide or monitor treatment for MRSA infections. Test performance is not FDA approved in patients less than 8 years old. Performed at [facility], [place] [address], [place], [place] [address]      Labs: Basic Metabolic Panel: Recent Labs  Lab 02/09/22 0630 02/09/22 0650 02/11/22 0602  NA 138 137 139  K 4.0 3.9 3.7  CL 107 107 108  CO2 21*  --  24  GLUCOSE 113* 109* 108*  BUN 20 24* 20  CREATININE 0.96 0.90 1.25*  CALCIUM 9.4  --  9.5   Liver Function Tests: Recent Labs  Lab 02/09/22 0630 02/10/22 0030 02/11/22 0602  AST '25 20 18  '$ ALT '12 15 14  '$ ALKPHOS 68 58  49  BILITOT 0.8 1.3* 0.9  PROT 5.3* 5.4* 5.2*  ALBUMIN 3.4* 3.1* 2.9*   Recent Labs  Lab 02/09/22 0835  LIPASE 30   No results for input(s): "AMMONIA" in the last 168 hours. CBC: Recent Labs  Lab  02/09/22 0630 02/09/22 0650 02/10/22 0030 02/11/22 0602  WBC 16.5*  --  13.0* 11.2*  NEUTROABS  --   --   --  4.2  HGB 10.7* 10.2* 10.2* 9.6*  HCT 31.3* 30.0* 29.5* 28.5*  MCV 94.8  --  92.5 93.8  PLT 286  --  275 272   Cardiac Enzymes: No results for input(s): "CKTOTAL", "CKMB", "CKMBINDEX", "TROPONINI" in the last 168 hours. BNP: BNP (last 3 results) Recent Labs    02/09/22 0630  BNP 158.8*    ProBNP (last 3 results) No results for input(s): "PROBNP" in the last 8760 hours.  CBG: No results for input(s): "GLUCAP" in the last 168 hours.     Signed:  Fayrene Helper MD.  [facility] 02/11/2022, 3:49 PM

## 2022-02-11 NOTE — Assessment & Plan Note (Signed)
Follows with Dr. Samuel Bouche cells noted

## 2022-02-11 NOTE — Progress Notes (Signed)
Mobility Specialist Progress Note    02/11/22 1237  Mobility  Activity Ambulated with assistance in hallway  Level of Assistance Standby assist, set-up cues, supervision of patient - no hands on  Assistive Device Front wheel walker  Distance Ambulated (ft) 420 ft  Activity Response Tolerated well  $Mobility charge 1 Mobility   Pre-Mobility: 74 HR Post-Mobility: 75 HR  Pt received in bed and agreeable. No complaints on walk. Returned to chair with call bell in reach.    Hildred Alamin Mobility Specialist

## 2022-02-11 NOTE — Assessment & Plan Note (Signed)
Mild, follow outpatient

## 2022-02-13 ENCOUNTER — Non-Acute Institutional Stay (SKILLED_NURSING_FACILITY): Payer: Medicare Other | Admitting: Adult Health

## 2022-02-13 ENCOUNTER — Encounter: Payer: Self-pay | Admitting: Adult Health

## 2022-02-13 DIAGNOSIS — N838 Other noninflammatory disorders of ovary, fallopian tube and broad ligament: Secondary | ICD-10-CM

## 2022-02-13 DIAGNOSIS — C9110 Chronic lymphocytic leukemia of B-cell type not having achieved remission: Secondary | ICD-10-CM

## 2022-02-13 DIAGNOSIS — K219 Gastro-esophageal reflux disease without esophagitis: Secondary | ICD-10-CM | POA: Diagnosis not present

## 2022-02-13 DIAGNOSIS — N1831 Chronic kidney disease, stage 3a: Secondary | ICD-10-CM | POA: Diagnosis not present

## 2022-02-13 DIAGNOSIS — F09 Unspecified mental disorder due to known physiological condition: Secondary | ICD-10-CM | POA: Diagnosis not present

## 2022-02-13 DIAGNOSIS — E782 Mixed hyperlipidemia: Secondary | ICD-10-CM

## 2022-02-13 DIAGNOSIS — K8050 Calculus of bile duct without cholangitis or cholecystitis without obstruction: Secondary | ICD-10-CM | POA: Diagnosis not present

## 2022-02-13 DIAGNOSIS — D509 Iron deficiency anemia, unspecified: Secondary | ICD-10-CM

## 2022-02-13 DIAGNOSIS — I4892 Unspecified atrial flutter: Secondary | ICD-10-CM | POA: Diagnosis not present

## 2022-02-13 NOTE — Progress Notes (Signed)
Location:    [facility] Room Number: 277 AJOIN of Service:  SNF ([phone]) Provider:  Royal Hawthorn, NP  [name], MD  Patient Care Team: [name], MD as PCP - General (Internal Medicine) Belva Crome, MD as PCP - Cardiology (Cardiology)  Extended Emergency Contact Information Primary Emergency Contact: [name] F Address: [address]          [place], [place] [address] [name] of [place] Phone: [phone] Mobile Phone: [phone] Relation: Spouse  Code Status:  DNR Goals of care: Advanced Directive information    02/09/2022    6:37 AM  Advanced Directives  Does Patient Have a Medical Advance Directive? Yes  Type of Paramedic of [place];Living will;Out of facility DNR (pink MOST or yellow form)  Does patient want to make changes to medical advance directive? No - Patient declined  Copy of [place] in Chart? No - copy requested, Physician notified     Chief Complaint  Patient presents with   Acute Visit    Post hospitalizations f/u.     HPI:  Pt is a 88 y.o. female seen today for an acute visit for hospitalization f/u.    PMH significant for MCI, CLL, IDA, CKD, HLD, diastolic CHF, thoracic aortic aneurysm, CAD, gall stones.   She was hospitalized 02/09/22-02/11/22 due to chest pain. Imaging showed evidence of choledocholithiasis. She is s/p ERCP with shincterotomy and removal of stones on 02/10/22.  She is on augmentin for 2 more days to cover for residual infection. During her hospitalization she had a bout of aflutter but converted back to NSR.  During her stay she had a CT of the chest and abd which showed a prominent left ovary measuring 2.2 cm.  RUQ abd ultrasound showed intra and extra hepatic biliary ductal dilation not significantly changed. 2 view CXR with mild cardiomegaly and no acute process.   WBC 11.2 02/11/22 Bun/Cr 20/1.25 02/11/22 Trop I 7  02/09/22  She is ready to discharge. Walking with a walker. Eating and drinking well. Having BMs and voiding. Her husband helps take care of her due to MCI.   Past Medical History:  Diagnosis Date   Abnormal nuclear stress test 01/26/2009   POST EXERCISE PVC's NOTED SINCE 2003   CLL (chronic lymphocytic leukemia) ([place]) 10/2009   DR. KALE   Colon polyp    HYPERPLASTIC   Diverticulosis    DJD (degenerative joint disease)    GERD (gastroesophageal reflux disease)    Hearing loss    DR. SHOEMAKER   Hypercholesterolemia    Hyperlipidemia    Iron deficiency anemia 05/2010   Lesion of breast    LEFT, SMALL   Microscopic hematuria    DR. PETERSON   Neuropathy    PERIPHERAL   Osteopenia    Renal cyst 10/2009   RENAL CYST ON CT 11/2009   Renal disease    STAGE 3   Tendon cysts    EXTENSOR 2MM RIGHT FIRST FINGER   Vitamin D deficiency 10/2011   Past Surgical History:  Procedure Laterality Date   ERCP N/A 02/10/2022   Procedure: ENDOSCOPIC RETROGRADE CHOLANGIOPANCREATOGRAPHY (ERCP);  Surgeon: Clarene Essex, MD;  Location: [facility];  Service: Gastroenterology;  Laterality: N/A;   INTRAMEDULLARY (IM) NAIL INTERTROCHANTERIC Left 07/07/2021   Procedure: INTRAMEDULLARY (IM) NAIL INTERTROCHANTRIC;  Surgeon: Paralee Cancel, MD;  Location: [facility];  Service: Orthopedics;  Laterality: Left;   LEFT HEART CATHETERIZATION WITH CORONARY ANGIOGRAM N/A 12/25/2011  Procedure: LEFT HEART CATHETERIZATION WITH CORONARY ANGIOGRAM;  Surgeon: Sinclair Grooms, MD;  Location: [facility] CATH LAB;  Service: Cardiovascular;  Laterality: N/A;   REMOVAL OF STONES  02/10/2022   Procedure: REMOVAL OF STONES;  Surgeon: Clarene Essex, MD;  Location: [facility] ENDOSCOPY;  Service: Gastroenterology;;   Joan Mayans  02/10/2022   Procedure: TFTDDUKGURKYHC;  Surgeon: Clarene Essex, MD;  Location: [facility] ENDOSCOPY;  Service: Gastroenterology;;    Allergies  Allergen Reactions   Amoxicillin Other (See Comments)    HEADACHES Other  reaction(s): Headache   Macrolides And Ketolides     Other reaction(s): Unknown   Metoprolol     Other reaction(s): hypotension   Ciprofloxacin Nausea Only and Rash    And tingling lips and itching   Macrodantin [Nitrofurantoin] Rash   Sulfa Antibiotics Nausea Only and Rash    And tingling lips and itching Other reaction(s): Unknown    Allergies as of 02/13/2022       Reactions   Amoxicillin Other (See Comments)   HEADACHES Other reaction(s): Headache   Macrolides And Ketolides    Other reaction(s): Unknown   Metoprolol    Other reaction(s): hypotension   Ciprofloxacin Nausea Only, Rash   And tingling lips and itching   Macrodantin [nitrofurantoin] Rash   Sulfa Antibiotics Nausea Only, Rash   And tingling lips and itching Other reaction(s): Unknown        Medication List        Accurate as of February 13, 2022 10:48 AM. If you have any questions, ask your nurse or doctor.          STOP taking these medications    ALPRAZolam 0.25 MG tablet Commonly known as: XANAX Stopped by: Royal Hawthorn, NP   Denta 5000 Plus 1.1 % Crea dental cream Generic drug: sodium fluoride Stopped by: Royal Hawthorn, NP   docusate sodium 100 MG capsule Commonly known as: COLACE Stopped by: Royal Hawthorn, NP   Nitrostat 0.4 MG SL tablet Generic drug: nitroGLYCERIN Stopped by: Royal Hawthorn, NP   polyethylene glycol 17 g packet Commonly known as: MIRALAX / GLYCOLAX Stopped by: Royal Hawthorn, NP       TAKE these medications    acetaminophen 500 MG tablet Commonly known as: TYLENOL Take 500 mg by mouth 2 (two) times daily. What changed: Another medication with the same name was removed. Continue taking this medication, and follow the directions you see here. Changed by: Royal Hawthorn, NP   ALIGN PO Take 1 capsule by mouth daily at 12 noon.   amoxicillin-clavulanate 500-125 MG tablet Commonly known as: Augmentin Take 1 tablet (500 mg total) by mouth 2 (two) times daily  for 2 days.   aspirin 81 MG tablet Take 81 mg by mouth daily.   CO Q 10 PO Take 1 tablet by mouth daily.   MOVE FREE ULTRA JOINT HEALTH PO Take 1 tablet by mouth daily.   multivitamin with minerals Tabs tablet Take 1 tablet by mouth daily.   pantoprazole 40 MG tablet Commonly known as: PROTONIX Take 40 mg by mouth daily before supper.   rosuvastatin 5 MG tablet Commonly known as: CRESTOR Take 5 mg by mouth at bedtime.   sertraline 25 MG tablet Commonly known as: ZOLOFT Take 25 mg by mouth daily. Taking QOD   sucralfate 1 g tablet Commonly known as: CARAFATE Take 1 g by mouth 2 (two) times daily.   traMADol 50 MG tablet Commonly known as: ULTRAM Take 50 mg by mouth every 12 (twelve)  hours as needed for moderate pain.        Review of Systems  Constitutional:  Negative for activity change, appetite change, chills, diaphoresis, fatigue, fever and unexpected weight change.  HENT:  Negative for congestion.   Respiratory:  Negative for cough, shortness of breath and wheezing.   Cardiovascular:  Negative for chest pain, palpitations and leg swelling.  Gastrointestinal:  Negative for abdominal distention, abdominal pain, constipation and diarrhea.  Genitourinary:  Negative for difficulty urinating and dysuria.  Musculoskeletal:  Positive for gait problem. Negative for arthralgias, back pain, joint swelling and myalgias.  Neurological:  Negative for dizziness, tremors, seizures, syncope, facial asymmetry, speech difficulty, weakness, light-headedness, numbness and headaches.  Psychiatric/Behavioral:  Negative for agitation, behavioral problems and confusion.        Memory loss    Immunization History  Administered Date(s) Administered   Influenza Split 07/05/2011, 05/30/2012, 05/28/2013, 06/09/2015, 06/16/2016, 06/02/2021   Influenza, High Dose Seasonal PF 05/04/2014, 05/04/2014, 05/28/2017, 04/30/2018, 06/22/2020   Influenza-Unspecified 05/14/2014, 06/03/2021   Moderna  Sars-Covid-2 Vaccination 08/27/2019, 09/24/2019, 05/27/2021   PFIZER(Purple Top)SARS-COV-2 Vaccination 04/29/2020   Pneumococcal Conjugate-13 10/28/2013   Pneumococcal Polysaccharide-23 05/23/2005, 12/28/2017   Td 04/04/2005, 04/04/2005   Tdap 05/07/2015   Zoster, Live 11/27/2011   Pertinent  Health Maintenance Due  Topic Date Due   DEXA SCAN  Never done   INFLUENZA VACCINE  03/14/2022      02/09/2022    7:40 PM 02/10/2022    7:45 AM 02/10/2022    8:14 AM 02/10/2022    7:10 PM 02/11/2022    4:00 PM  Fall Risk  Patient Fall Risk Level Moderate fall risk Moderate fall risk Low fall risk Moderate fall risk Moderate fall risk   Functional Status Survey:    Vitals:   02/13/22 1038  BP: 119/70  Pulse: 66  Resp: 17  Temp: 97.7 F (36.5 C)  SpO2: 97%  Weight: 141 lb (64 kg)  Height: 5' 4.3" (1.633 m)   Body mass index is 23.98 kg/m. Physical Exam Vitals and nursing note reviewed.  Constitutional:      General: She is not in acute distress.    Appearance: She is not diaphoretic.  HENT:     Head: Normocephalic and atraumatic.  Neck:     Vascular: No JVD.  Cardiovascular:     Rate and Rhythm: Normal rate and regular rhythm.     Heart sounds: No murmur heard. Pulmonary:     Effort: Pulmonary effort is normal. No respiratory distress.     Breath sounds: Normal breath sounds. No wheezing.  Abdominal:     General: Bowel sounds are normal. There is no distension.     Palpations: Abdomen is soft.     Tenderness: There is no abdominal tenderness. There is no right CVA tenderness or left CVA tenderness.  Musculoskeletal:     Right lower leg: No edema.     Left lower leg: No edema.  Skin:    General: Skin is warm and dry.     Findings: Bruising present.  Neurological:     Mental Status: She is alert and oriented to person, place, and time.  Psychiatric:        Mood and Affect: Mood normal.     Labs reviewed: Recent Labs    07/06/21 2335 07/07/21 0435 10/18/21 1506  02/09/22 0630 02/09/22 0650 02/11/22 0602  NA 136   < > 137 138 137 139  K 4.3   < > 4.2 4.0 3.9 3.7  CL 105   < > 104 107 107 108  CO2 25   < > 28 21*  --  24  GLUCOSE 115*   < > 90 113* 109* 108*  BUN 25*   < > 20 20 24* 20  CREATININE 0.95   < > 1.03* 0.96 0.90 1.25*  CALCIUM 9.4   < > 10.4* 9.4  --  9.5  MG 2.1  --   --   --   --   --   PHOS 2.5  --   --   --   --   --    < > = values in this interval not displayed.   Recent Labs    02/09/22 0630 02/10/22 0030 02/11/22 0602  AST '25 20 18  '$ ALT '12 15 14  '$ ALKPHOS 68 58 49  BILITOT 0.8 1.3* 0.9  PROT 5.3* 5.4* 5.2*  ALBUMIN 3.4* 3.1* 2.9*   Recent Labs    07/07/21 0435 07/08/21 0514 10/18/21 1506 02/09/22 0630 02/09/22 0650 02/10/22 0030 02/11/22 0602  WBC 17.3*   < > 15.5* 16.5*  --  13.0* 11.2*  NEUTROABS 11.8*  --  3.4  --   --   --  4.2  HGB 10.3*   < > 11.2* 10.7* 10.2* 10.2* 9.6*  HCT 31.2*   < > 33.1* 31.3* 30.0* 29.5* 28.5*  MCV 94.3   < > 92.2 94.8  --  92.5 93.8  PLT 247   < > 366 286  --  275 272   < > = values in this interval not displayed.   Lab Results  Component Value Date   TSH 2.244 02/11/2022   No results found for: "HGBA1C" No results found for: "CHOL", "HDL", "LDLCALC", "LDLDIRECT", "TRIG", "CHOLHDL"  Significant Diagnostic Results in last 30 days:  ECHOCARDIOGRAM COMPLETE  Result Date: 02/11/2022    ECHOCARDIOGRAM REPORT   Patient Name:   MARJORIA MANCILLAS Date of Exam: 02/11/2022 Medical Rec #:  [id number]      Height:       63.0 in Accession #:    [id number]     Weight:       141.0 lb Date of Birth:  18-Nov-1925      BSA:          1.667 m Patient Age:    4 years       BP:           138/86 mmHg Patient Gender: F              HR:           87 bpm. Exam Location:  Inpatient Procedure: 2D Echo, Cardiac Doppler and Color Doppler Indications:    I48.92* Unspecified atrial flutter  History:        Patient has prior history of Echocardiogram examinations, most                 recent 07/08/2021. CHF,  CAD, Abnormal ECG, Arrythmias:Atrial                 Flutter, Signs/Symptoms:Chest Pain; Risk Factors:Dyslipidemia.  Sonographer:    Roseanna Rainbow RDCS Referring Phys: A CALDWELL POWELL JR IMPRESSIONS  1. Left ventricular ejection fraction, by estimation, is 60 to 65%. The left ventricle has normal function. The left ventricle has no regional wall motion abnormalities. There is mild asymmetric left ventricular hypertrophy of the basal and septal segments. Left ventricular diastolic parameters were normal.  2. Right ventricular systolic  function is normal. The right ventricular size is normal. There is mildly elevated pulmonary artery systolic pressure.  3. The mitral valve is abnormal. Trivial mitral valve regurgitation. No evidence of mitral stenosis.  4. No significant change in gradients since 07/08/21 . The aortic valve is tricuspid. There is moderate calcification of the aortic valve. There is moderate thickening of the aortic valve. Aortic valve regurgitation is trivial. Mild aortic valve stenosis.  5. Aortic dilatation noted. There is moderate dilatation of the ascending aorta, measuring 45 mm.  6. The inferior vena cava is normal in size with greater than 50% respiratory variability, suggesting right atrial pressure of 3 mmHg. FINDINGS  Left Ventricle: Left ventricular ejection fraction, by estimation, is 60 to 65%. The left ventricle has normal function. The left ventricle has no regional wall motion abnormalities. The left ventricular internal cavity size was normal in size. There is  mild asymmetric left ventricular hypertrophy of the basal and septal segments. Left ventricular diastolic parameters were normal. Right Ventricle: The right ventricular size is normal. No increase in right ventricular wall thickness. Right ventricular systolic function is normal. There is mildly elevated pulmonary artery systolic pressure. The tricuspid regurgitant velocity is 2.68  m/s, and with an assumed right atrial pressure  of 8 mmHg, the estimated right ventricular systolic pressure is 44.8 mmHg. Left Atrium: Left atrial size was normal in size. Right Atrium: Right atrial size was normal in size. Pericardium: There is no evidence of pericardial effusion. Mitral Valve: The mitral valve is abnormal. There is moderate thickening of the mitral valve leaflet(s). There is moderate calcification of the mitral valve leaflet(s). Mild mitral annular calcification. Trivial mitral valve regurgitation. No evidence of  mitral valve stenosis. Tricuspid Valve: The tricuspid valve is normal in structure. Tricuspid valve regurgitation is mild . No evidence of tricuspid stenosis. Aortic Valve: No significant change in gradients since 07/08/21. The aortic valve is tricuspid. There is moderate calcification of the aortic valve. There is moderate thickening of the aortic valve. Aortic valve regurgitation is trivial. Mild aortic stenosis is present. Aortic valve mean gradient measures 8.3 mmHg. Aortic valve peak gradient measures 15.3 mmHg. Aortic valve area, by VTI measures 2.37 cm. Pulmonic Valve: The pulmonic valve was normal in structure. Pulmonic valve regurgitation is mild. No evidence of pulmonic stenosis. Aorta: Aortic dilatation noted. There is moderate dilatation of the ascending aorta, measuring 45 mm. Venous: The inferior vena cava is normal in size with greater than 50% respiratory variability, suggesting right atrial pressure of 3 mmHg. IAS/Shunts: No atrial level shunt detected by color flow Doppler.  LEFT VENTRICLE PLAX 2D LVIDd:         2.90 cm     Diastology LVIDs:         1.80 cm     LV e' medial:    6.48 cm/s LV PW:         0.90 cm     LV E/e' medial:  13.8 LV IVS:        1.20 cm     LV e' lateral:   7.62 cm/s LVOT diam:     2.00 cm     LV E/e' lateral: 11.7 LV SV:         87 LV SV Index:   52 LVOT Area:     3.14 cm  LV Volumes (MOD) LV vol d, MOD A2C: 64.2 ml LV vol d, MOD A4C: 73.8 ml LV vol s, MOD A2C: 20.1 ml LV vol s, MOD A4C:  21.8 ml LV SV MOD A2C:     44.1 ml LV SV MOD A4C:     73.8 ml LV SV MOD BP:      48.6 ml RIGHT VENTRICLE             IVC RV S prime:     15.20 cm/s  IVC diam: 1.20 cm TAPSE (M-mode): 1.8 cm LEFT ATRIUM           Index        RIGHT ATRIUM           Index LA diam:      2.50 cm 1.50 cm/m   RA Area:     13.00 cm LA Vol (A2C): 19.9 ml 11.94 ml/m  RA Volume:   27.90 ml  16.74 ml/m LA Vol (A4C): 15.5 ml 9.30 ml/m  AORTIC VALVE                     PULMONIC VALVE AV Area (Vmax):    2.73 cm      PR End Diast Vel: 1.65 msec AV Area (Vmean):   2.60 cm AV Area (VTI):     2.37 cm AV Vmax:           195.33 cm/s AV Vmean:          130.667 cm/s AV VTI:            0.367 m AV Peak Grad:      15.3 mmHg AV Mean Grad:      8.3 mmHg LVOT Vmax:         170.00 cm/s LVOT Vmean:        108.000 cm/s LVOT VTI:          0.277 m LVOT/AV VTI ratio: 0.76  AORTA Ao Root diam: 3.40 cm Ao Asc diam:  4.40 cm MITRAL VALVE               TRICUSPID VALVE MV Area (PHT): 4.06 cm    TR Peak grad:   28.7 mmHg MV Decel Time: 187 msec    TR Vmax:        268.00 cm/s MV E velocity: 89.20 cm/s MV A velocity: 77.10 cm/s  SHUNTS MV E/A ratio:  1.16        Systemic VTI:  0.28 m                            Systemic Diam: 2.00 cm Jenkins Rouge MD Electronically signed by Jenkins Rouge MD Signature Date/Time: 02/11/2022/11:35:11 AM    Final    DG ERCP  Result Date: 02/10/2022 CLINICAL DATA:  ERCP. EXAM: ERCP TECHNIQUE: Multiple spot images obtained with the fluoroscopic device and submitted for interpretation post-procedure. FLUOROSCOPY TIME: FLUOROSCOPY TIME 7 minutes, 51 seconds (83.7 mGy) COMPARISON:  Right upper quadrant abdominal ultrasound-02/09/2022; chest CT-11/10/2011 FINDINGS: Three spot intraoperative fluoroscopic images the right upper abdominal quadrant during ERCP are provided for review. Initial image demonstrates an ERCP probe overlying the right upper abdominal quadrant. Surgical clips overlie the expected location of the gallbladder fossa. There  is selective cannulation and opacification of the common bile duct which appears markedly dilated. There is minimal opacification of the intrahepatic biliary tree which appears mildly dilated. There is no definitive opacification of the cystic or pancreatic ducts. IMPRESSION: ERCP as above. These images were submitted for radiologic interpretation only. Please see the procedural report for the amount of contrast and the fluoroscopy  time utilized. Electronically Signed   By: Sandi Mariscal M.D.   On: 02/10/2022 12:11   DG C-Arm 1-60 Min-No Report  Result Date: 02/10/2022 Fluoroscopy was utilized by the requesting physician.  No radiographic interpretation.   US Abdomen Limited RUQ (LIVER/GB)  Result Date: 02/09/2022 CLINICAL DATA:  Right upper quadrant abdominal and epigastric pain since last night EXAM: ULTRASOUND ABDOMEN LIMITED RIGHT UPPER QUADRANT COMPARISON:  Same-day CTA abdomen/pelvis, MR abdomen/pelvis 10/29/2018 FINDINGS: Gallbladder: The gallbladder is not identified, presumed surgically absent. Common bile duct: Diameter: The common bile duct is dilated measuring up to 11 mm with intrahepatic biliary ductal dilation. This is similar to the prior CT as well as the MRI from 10/28/2021. The filling defects seen on the prior CT is not identified on the current study Liver: No focal lesion identified. Within normal limits in parenchymal echogenicity. Portal vein is patent on color Doppler imaging with normal direction of blood flow towards the liver. Other: Right renal cysts are again noted. IMPRESSION: Intra and extrahepatic biliary ductal dilation is not significantly changed compared to the recent CT or MRI from 10/28/2021 but is slightly increased compared to more remote studies. The filling defect seen on the prior CT and MRI is not seen on the current study. Electronically Signed   By: Valetta Mole M.D.   On: 02/09/2022 10:00   CT Angio Chest/Abd/Pel for Dissection W and/or W/WO  Result Date:  02/09/2022 CLINICAL DATA:  89 year old with chest and back pain. Aortic dissection is suspected. EXAM: CT ANGIOGRAPHY CHEST, ABDOMEN AND PELVIS TECHNIQUE: Non-contrast CT of the chest was initially obtained. Multidetector CT imaging through the chest, abdomen and pelvis was performed using the standard protocol during bolus administration of intravenous contrast. Multiplanar reconstructed images and MIPs were obtained and reviewed to evaluate the vascular anatomy. RADIATION DOSE REDUCTION: This exam was performed according to the departmental dose-optimization program which includes automated exposure control, adjustment of the mA and/or kV according to patient size and/or use of iterative reconstruction technique. CONTRAST:  42m OMNIPAQUE IOHEXOL 350 MG/ML SOLN COMPARISON:  CTA chest 09/29/2021 and chest CTA 02/06/2019 and CTA chest, abdomen, pelvis 08/06/2018 FINDINGS: CTA CHEST FINDINGS Cardiovascular: Limited evaluation of the aortic root due to pulsation artifact. The ascending thoracic aorta is aneurysmal measuring 4.2 cm and stable. No evidence to suggest an aortic dissection or intramural hematoma. Great vessels are patent. Proximal descending thoracic aorta measures 2.6 cm and minimally changed. Mild atherosclerotic disease in the thoracic aorta. Although the study was designed to evaluate the thoracic aorta, there is opacification of the pulmonary arteries and no evidence for large or central pulmonary embolism. Heart size is stable. Minimal pericardial fluid is similar to the previous examination. Mediastinum/Nodes: No lymph node enlargement in the mediastinum, hila or axillary regions. Lungs/Pleura: 3 mm pleural-based nodule in the right lower lobe on 08/109 is stable since 2020. Stable focal density along the right minor fissure on image 8/86 is stable. Stable pleural-based nodularity in the right middle lobe on image 8/104. No evidence for new airspace disease or lung consolidation. No pleural  effusions. Additional small nodular densities scattered throughout the lungs are stable since 2020. Musculoskeletal: Thoracic vertebral body heights are maintained. No evidence for a vertebral body compression fracture. No acute bone abnormality. Review of the MIP images confirms the above findings. CTA ABDOMEN AND PELVIS FINDINGS VASCULAR Aorta: Atherosclerotic disease in the abdominal aorta without aneurysm, dissection or significant stenosis. Celiac: Patent without evidence of aneurysm, dissection, vasculitis or significant stenosis. SMA: Mild  narrowing at the origin. No evidence for aneurysm or dissection. Renals: Both renal arteries are patent without evidence of aneurysm, dissection, vasculitis, fibromuscular dysplasia or significant stenosis. IMA: Patent without evidence of aneurysm, dissection, vasculitis or significant stenosis. Inflow: Patent without evidence of aneurysm, dissection, vasculitis or significant stenosis. 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 This is a normal variant. Review of the MIP images confirms the above findings. NON-VASCULAR Hepatobiliary: Stable appearance of the liver. Cholecystectomy. Again noted is dilated extrahepatic bile duct. However, there is a dense filling defect within the common bile duct on image 7/166. This is most compatible with choledocholithiasis. This filling defect measures up to 1.4 cm on the coronal reformats. Common bile duct measures up to 1.8 cm which is slightly increased from 2019 when the duct measured up to 1.5 cm. There is a small amount of intrahepatic biliary dilatation which is poorly characterized. Pancreas: Unremarkable. No pancreatic ductal dilatation or surrounding inflammatory changes. Spleen: Normal in size without focal abnormality. Adrenals/Urinary Tract: Stable appearance of the adrenal glands without gross abnormality. Again noted are bilateral renal cysts particularly involving the right kidney  upper pole. No hydronephrosis. Normal appearance of the urinary bladder. Stomach/Bowel: Moderate amount of stool throughout the colon particularly in the rectum. Scattered colonic diverticula without acute bowel inflammation. Normal appearance of the small bowel without significant dilatation. Normal appearance of the stomach. Lymphatic: Slightly prominent lymph nodes in the abdominal mesentery. Largest nodal tissue measures up to 1.3 cm in the short axis on image 07/209 and suspect this nodal tissue has slightly increased since 2019 but difficult to compare lymph nodes in this area. In addition, difficult evaluate the central mesenteric lymph nodes due to the arterial phase of contrast. There is no significant retroperitoneal lymph node enlargement. Reproductive: Patient appears to have a small but uterus poorly characterized on this examination. Left ovary/adnexa is prominent on image 7/222 measuring 2.2 cm in the short axis and previously measured 1.9 cm in 2019. No gross abnormality to the right adnexa. Other: Negative for free fluid.  No evidence for free air. Musculoskeletal: Surgical hardware in the proximal left femur. Multilevel disc space narrowing in the lumbar spine. Mild compression deformity along the L2 superior endplate is chronic. No evidence for acute fracture. Review of the MIP images confirms the above findings. IMPRESSION: 1. Negative for aortic dissection or intramural hematoma. 2. Stable fusiform aneurysm of the ascending thoracic aorta measuring 4.2 cm. Recommend annual imaging followup by CTA or MRA. This recommendation follows 2010 ACCF/AHA/AATS/ACR/ASA/SCA/SCAI/SIR/STS/SVM Guidelines for the Diagnosis and Management of Patients with Thoracic Aortic Disease. Circulation. 2010; 121: M426-S341. Aortic aneurysm NOS (ICD10-I71.9) 3. Slightly increased dilatation of the extrahepatic bile duct with a large filling defect. Findings are most compatible with choledocholithiasis. 4. Slight  enlargement of the left ovary/adnexa since 2019. In addition, there are slightly increased mesenteric lymph nodes. These findings are nonspecific and difficult to characterize due to the phase of contrast. Consider follow-up abdominopelvic CT using portal venous phase imaging to better characterize these findings. 5. Colonic diverticulosis. Electronically Signed   By: Markus Daft M.D.   On: 02/09/2022 08:26   DG Chest 2 View  Result Date: 02/09/2022 CLINICAL DATA:  88 year old female presenting with sharp chest pain radiating to the back. EXAM: CHEST - 2 VIEW COMPARISON:  July 06, 2021. FINDINGS: Cardiomediastinal contours and hilar structures are stable. Heart size mildly enlarged. No lobar consolidation.  No sign of pleural effusion. No visible pneumothorax. On limited  assessment there is no acute skeletal process. IMPRESSION: Mild cardiomegaly. No acute cardiopulmonary process. Electronically Signed   By: Zetta Bills M.D.   On: 02/09/2022 08:07    Assessment/Plan  1. Choledocholithiasis S/P ERCP F/U with GI if needed  [facility] for discharge  2. Atrial flutter, unspecified type ([place]) Resolved Will f/u with cardiology   3. Ovarian enlargement, left Noted on CT May need f/u imaging F/U with PCP   4. Mild cognitive disorder Needs MMSE Ok to discharge when her husband is ready to pick her up  5. CLL (chronic lymphocytic leukemia) (HCC) WBC 11.5, trending down   6. Gastroesophageal reflux disease without esophagitis Continue PPI and carafate. Followed by GI  7. Stage 3a chronic kidney disease (HCC) Continue to periodically monitor BMP and avoid nephrotoxic agents  8. Iron deficiency anemia, unspecified iron deficiency anemia type Lab Results  Component Value Date   HGB 9.6 (L) 02/11/2022  Should f/u with PCP    9. Mixed hyperlipidemia ON crestor   10. Thoracic aneurysm Followed with serial CTs  4.2cm   Family/ staff Communication: resident and nurse   Labs/tests  ordered:   CBC CMP 1-2 weeks

## 2022-02-20 DIAGNOSIS — K8050 Calculus of bile duct without cholangitis or cholecystitis without obstruction: Secondary | ICD-10-CM | POA: Diagnosis not present

## 2022-03-02 DIAGNOSIS — R350 Frequency of micturition: Secondary | ICD-10-CM | POA: Diagnosis not present

## 2022-03-02 DIAGNOSIS — R42 Dizziness and giddiness: Secondary | ICD-10-CM | POA: Diagnosis not present

## 2022-03-02 NOTE — Progress Notes (Signed)
Office Visit    Patient Name: Rebecca King Date of Encounter: 03/03/2022  PCP:  [name], [place]  Cardiologist:  Sinclair Grooms, MD  Advanced Practice Provider:  No care team member to display Electrophysiologist:  None    Chief Complaint    Rebecca King is a 88 y.o. female with a hx of hypercholesterolemia, GERD, CLL, aortic aneurysm, CAD (E cardiac cath demonstrating 39 to 80% ostial diagonal 2013), history of syncope presents today for overdue follow-up visit.  She was last seen by Dr. Tamala Julian 12/2019 and at that time she was having some dyspnea and fatigue.  She had episode of near syncope with chest pain and shortness of breath as well as weakness.  There were no positive findings during her emergency visit.  She had no recurrence of symptoms.  The episode occurred after she had an injection from an orthopedist.  She denied orthopnea, PND, lower extremity edema.  The plan at the time was clinical observation and to continue her current medication regimen.  She presented to the hospital last month with a chief complaint of chest pain.  Troponins were negative x2.  CT of the chest abdomen pelvis without dissection or intramural hematoma.  Choledocholithiasis and discharged on antibiotics.  She underwent ERCP with complete removal.  Apparently had atrial flutter while in the hospital.  Anticoagulation was held due to prior history.  Echocardiogram was performed with EF 66 5% with no RWMA.  Today, she feels pretty good.  Her and her husband are celebrating 14 years of marriage. She has not had any chest discomfort since her ERCP with complete removal of her gallstones.  She was discharged on antibiotics and completed those.  She states that she occasionally gets a hot flash and breaks out into a sweat.  She does not have any fluttering in her chest or any other symptoms when this happens.  This does not sound particularly cardiac in nature.  It  sounds like it is resolving and happen more frequently when she first got home from the hospital.  She apparently did have atrial flutter when she was in the hospital however anticoagulation was held due to her prior history.  She states that she does get a little dizzy and lightheaded when moving from sitting to standing and when her blood pressure was taken at the facility it dropped quite a bit with standing.  We discussed orthostatic hypotension and staying hydrated in addition to wearing compression socks/stockings. BP stable today 120/70.  Reports no shortness of breath nor dyspnea on exertion. Reports no chest pain, pressure, or tightness. No edema, orthopnea, PND. Reports no palpitations.   Past Medical History    Past Medical History:  Diagnosis Date   Abnormal nuclear stress test 01/26/2009   POST EXERCISE PVC's NOTED SINCE 2003   CLL (chronic lymphocytic leukemia) ([place]) 10/2009   DR. KALE   Colon polyp    HYPERPLASTIC   Diverticulosis    DJD (degenerative joint disease)    GERD (gastroesophageal reflux disease)    Hearing loss    DR. SHOEMAKER   Hypercholesterolemia    Hyperlipidemia    Iron deficiency anemia 05/2010   Lesion of breast    LEFT, SMALL   Microscopic hematuria    DR. PETERSON   Neuropathy    PERIPHERAL   Osteopenia    Renal cyst 10/2009   RENAL CYST ON CT 11/2009   Renal disease  STAGE 3   Tendon cysts    EXTENSOR 2MM RIGHT FIRST FINGER   Vitamin D deficiency 10/2011   Past Surgical History:  Procedure Laterality Date   ERCP N/A 02/10/2022   Procedure: ENDOSCOPIC RETROGRADE CHOLANGIOPANCREATOGRAPHY (ERCP);  Surgeon: Clarene Essex, MD;  Location: [place];  Service: Gastroenterology;  Laterality: N/A;   INTRAMEDULLARY (IM) NAIL INTERTROCHANTERIC Left 07/07/2021   Procedure: INTRAMEDULLARY (IM) NAIL INTERTROCHANTRIC;  Surgeon: Paralee Cancel, MD;  Location: [facility];  Service: Orthopedics;  Laterality: Left;   LEFT HEART CATHETERIZATION WITH CORONARY  ANGIOGRAM N/A 12/25/2011   Procedure: LEFT HEART CATHETERIZATION WITH CORONARY ANGIOGRAM;  Surgeon: Sinclair Grooms, MD;  Location[facility];  Service: Cardiovascular;  Laterality: N/A;   REMOVAL OF STONES  02/10/2022   Procedure: REMOVAL OF STONES;  Surgeon: Clarene Essex, MD;  Location: [place];  Service: Gastroenterology;;   Joan Mayans  02/10/2022   Procedure: Joan Mayans;  Surgeon: Clarene Essex, MD;  Location[facility];  Service: Gastroenterology;;    Allergies  Allergies  Allergen Reactions   Amoxicillin Other (See Comments)    HEADACHES Other reaction(s): Headache   Macrolides And Ketolides     Other reaction(s): Unknown   Metoprolol     Other reaction(s): hypotension   Ciprofloxacin Nausea Only and Rash    And tingling lips and itching   Macrodantin [Nitrofurantoin] Rash   Sulfa Antibiotics Nausea Only and Rash    And tingling lips and itching Other reaction(s): Unknown    EKGs/Labs/Other Studies Reviewed:   The following studies were reviewed today:  Echocardiogram 02/11/2022  IMPRESSIONS     1. Left ventricular ejection fraction, by estimation, is 60 to 65%. The  left ventricle has normal function. The left ventricle has no regional  wall motion abnormalities. There is mild asymmetric left ventricular  hypertrophy of the basal and septal  segments. Left ventricular diastolic parameters were normal.   2. Right ventricular systolic function is normal. The right ventricular  size is normal. There is mildly elevated pulmonary artery systolic  pressure.   3. The mitral valve is abnormal. Trivial mitral valve regurgitation. No  evidence of mitral stenosis.   4. No significant change in gradients since 07/08/21 . The aortic valve  is tricuspid. There is moderate calcification of the aortic valve. There  is moderate thickening of the aortic valve. Aortic valve regurgitation is  trivial. Mild aortic valve  stenosis.   5. Aortic dilatation noted. There  is moderate dilatation of the ascending  aorta, measuring 45 mm.   6. The inferior vena cava is normal in size with greater than 50%  respiratory variability, suggesting right atrial pressure of 3 mmHg.    ERCP - The major papilla was some distance away from a diverticulum. - The major papilla appeared normal. - Choledocholithiasis was found. Complete removal was accomplished by biliary sphincterotomy and balloon extraction as above. - A biliary sphincterotomy was performed. - The biliary tree was swept with multiple balloons at the end of the procedure and and nothing was found.  Impression: - Clear liquid diet for 6 hours. If doing well may have soft solids this evening - Continue present medications. - Return to GI clinic PRN. - Telephone GI clinic if symptomatic PRN. - Check liver enzymes (AST, ALT, alkaline phosphatase, bilirubin) and hemogram with white blood cell count and platelets in the morning. And if doing well in the a.m. and no obvious complications may go home from the GI standpoint    CTA 02/09/2022 IMPRESSION:  1. Negative for aortic dissection or intramural hematoma. 2. Stable fusiform aneurysm of the ascending thoracic aorta measuring 4.2 cm. Recommend annual imaging followup by CTA or MRA. This recommendation follows 2010 ACCF/AHA/AATS/ACR/ASA/SCA/SCAI/SIR/STS/SVM Guidelines for the Diagnosis and Management of Patients with Thoracic Aortic Disease. Circulation. 2010; 121: H852-D782. Aortic aneurysm NOS (ICD10-I71.9) 3. Slightly increased dilatation of the extrahepatic bile duct with a large filling defect. Findings are most compatible with choledocholithiasis. 4. Slight enlargement of the left ovary/adnexa since 2019. In addition, there are slightly increased mesenteric lymph nodes. These findings are nonspecific and difficult to characterize due to the phase of contrast. Consider follow-up abdominopelvic CT using portal venous phase imaging to better  characterize these findings. 5. Colonic diverticulosis.     Electronically Signed   By: Markus Daft M.D.   On: 02/09/2022 08:26  EKG:  EKG is  ordered today.  The ekg ordered today demonstrates NSR rate 68 bpm  Recent Labs: 07/06/2021: Magnesium 2.1 02/09/2022: B Natriuretic Peptide 158.8 02/11/2022: ALT 14; BUN 20; Creatinine, Ser 1.25; Hemoglobin 9.6; Platelets 272; Potassium 3.7; Sodium 139; TSH 2.244  Recent Lipid Panel No results found for: "CHOL", "TRIG", "HDL", "CHOLHDL", "VLDL", "LDLCALC", "LDLDIRECT" Home Medications   Current Meds  Medication Sig   acetaminophen (TYLENOL) 500 MG tablet Take 500 mg by mouth 2 (two) times daily.   aspirin 81 MG tablet Take 81 mg by mouth daily.   Coenzyme Q10 (CO Q 10 PO) Take 1 tablet by mouth daily.   Collagen-Boron-Hyaluronic Acid (MOVE FREE ULTRA JOINT HEALTH PO) Take 1 tablet by mouth daily.   Multiple Vitamin (MULITIVITAMIN WITH MINERALS) TABS Take 1 tablet by mouth daily.   pantoprazole (PROTONIX) 40 MG tablet Take 40 mg by mouth daily before supper.   Probiotic Product (ALIGN PO) Take 1 capsule by mouth daily at 12 noon.   rosuvastatin (CRESTOR) 5 MG tablet Take 5 mg by mouth at bedtime.   sertraline (ZOLOFT) 25 MG tablet Take 25 mg by mouth daily. Taking QOD   sucralfate (CARAFATE) 1 g tablet Take 1 g by mouth 2 (two) times daily.   traMADol (ULTRAM) 50 MG tablet Take 50 mg by mouth every 12 (twelve) hours as needed for moderate pain.     Review of Systems      All other systems reviewed and are otherwise negative except as noted above.  Physical Exam    VS:  BP 120/70   Pulse 68   Ht '5\' 4"'$  (1.626 m)   Wt 146 lb (66.2 kg)   BMI 25.06 kg/m  , BMI Body mass index is 25.06 kg/m.  Wt Readings from Last 3 Encounters:  03/03/22 146 lb (66.2 kg)  02/13/22 141 lb (64 kg)  02/09/22 141 lb (64 kg)     GEN: Well nourished, well developed, in no acute distress. HEENT: normal. Neck: Supple, no JVD, carotid bruits, or  masses. Cardiac: RRR, no murmurs, rubs, or gallops. No clubbing, cyanosis, edema.  Radials/PT 2+ and equal bilaterally.  Respiratory:  Respirations regular and unlabored, clear to auscultation bilaterally. GI: Soft, nontender, nondistended. MS: No deformity or atrophy. Skin: Warm and dry, no rash. Neuro:  Strength and sensation are intact. Psych: Normal affect.  Assessment & Plan    CAD -no chest pain -continue ASA '81mg'$ , Crestor '5mg'$  at bedtime  Accelerated idioventricular rhythm  -no palpitations -not taking Toprol-XL -short burst of Aflutter in the hospital, no anticoagulation  -NSR today  Thoracic ascending aortic aneurysm -Echo in 1 year to re-evaluate -measuring  45 mm  AKI -Follow-up Bmet today to evaluate  CLL -stable, pre primary  Orthostatic hypotension -suggested TED hose/compression stockings -maintain fluid intake (drinking 8 cups of water) -BMP today to check electrolytes -slow transitioning from sitting to standing   Disposition: Follow up 1 year with Sinclair Grooms, MD or APP.  Signed, Elgie Collard, PA-C 03/03/2022, 4:10 PM [facility]

## 2022-03-03 ENCOUNTER — Ambulatory Visit (INDEPENDENT_AMBULATORY_CARE_PROVIDER_SITE_OTHER): Payer: Medicare Other | Admitting: Physician Assistant

## 2022-03-03 ENCOUNTER — Encounter: Payer: Self-pay | Admitting: Physician Assistant

## 2022-03-03 VITALS — BP 120/70 | HR 68 | Ht 64.0 in | Wt 146.0 lb

## 2022-03-03 DIAGNOSIS — I2510 Atherosclerotic heart disease of native coronary artery without angina pectoris: Secondary | ICD-10-CM

## 2022-03-03 DIAGNOSIS — I5032 Chronic diastolic (congestive) heart failure: Secondary | ICD-10-CM

## 2022-03-03 DIAGNOSIS — C9110 Chronic lymphocytic leukemia of B-cell type not having achieved remission: Secondary | ICD-10-CM | POA: Diagnosis not present

## 2022-03-03 DIAGNOSIS — I7121 Aneurysm of the ascending aorta, without rupture: Secondary | ICD-10-CM

## 2022-03-03 DIAGNOSIS — I951 Orthostatic hypotension: Secondary | ICD-10-CM

## 2022-03-03 DIAGNOSIS — I4892 Unspecified atrial flutter: Secondary | ICD-10-CM | POA: Diagnosis not present

## 2022-03-03 DIAGNOSIS — N179 Acute kidney failure, unspecified: Secondary | ICD-10-CM | POA: Diagnosis not present

## 2022-03-03 DIAGNOSIS — I209 Angina pectoris, unspecified: Secondary | ICD-10-CM

## 2022-03-03 NOTE — Patient Instructions (Addendum)
Medication Instructions:  NO CHANGES *If you need a refill on your cardiac medications before your next appointment, please call your pharmacy*   Lab Work: BMET TODAY  If you have labs (blood work) drawn today and your tests are completely normal, you will receive your results only by: [place] (if you have MyChart) OR A paper copy in the mail If you have any lab test that is abnormal or we need to change your treatment, we will call you to review the results.   Testing/Procedures: NONE   Follow-Up: At [facility] you and your health needs are our priority.  As part of our continuing mission to provide you with exceptional heart care, we have created designated Provider Care Teams.  These Care Teams include your primary Cardiologist (physician) and Advanced Practice Providers (APPs -  Physician Assistants and Nurse Practitioners) who all work together to provide you with the care you need, when you need it.  We recommend signing up for the patient portal called "MyChart".  Sign up information is provided on this After Visit Summary.  MyChart is used to connect with patients for Virtual Visits (Telemedicine).  Patients are able to view lab/test results, encounter notes, upcoming appointments, etc.  Non-urgent messages can be sent to your provider as well.   To learn more about what you can do with MyChart, go to [email or web address].    Your next appointment:   1 year(s)  The format for your next appointment:   In Person  Provider:   Sinclair Grooms, MD       Other Instructions ECHO NEXT YEAR PRIOR TO SEEING DR Tamala Julian   Important Information About Sugar

## 2022-03-04 LAB — BASIC METABOLIC PANEL
BUN/Creatinine Ratio: 17 (ref 12–28)
BUN: 16 mg/dL (ref 10–36)
CO2: 25 mmol/L (ref 20–29)
Calcium: 10.3 mg/dL (ref 8.7–10.3)
Chloride: 103 mmol/L (ref 96–106)
Creatinine, Ser: 0.94 mg/dL (ref 0.57–1.00)
Glucose: 97 mg/dL (ref 70–99)
Potassium: 4.1 mmol/L (ref 3.5–5.2)
Sodium: 141 mmol/L (ref 134–144)
eGFR: 56 mL/min/{1.73_m2} — ABNORMAL LOW (ref >59)

## 2022-03-09 DIAGNOSIS — M1712 Unilateral primary osteoarthritis, left knee: Secondary | ICD-10-CM | POA: Diagnosis not present

## 2022-03-16 DIAGNOSIS — M1712 Unilateral primary osteoarthritis, left knee: Secondary | ICD-10-CM | POA: Diagnosis not present

## 2022-03-24 DIAGNOSIS — H04212 Epiphora due to excess lacrimation, left lacrimal gland: Secondary | ICD-10-CM | POA: Diagnosis not present

## 2022-03-24 DIAGNOSIS — H10412 Chronic giant papillary conjunctivitis, left eye: Secondary | ICD-10-CM | POA: Diagnosis not present

## 2022-03-24 DIAGNOSIS — H04122 Dry eye syndrome of left lacrimal gland: Secondary | ICD-10-CM | POA: Diagnosis not present

## 2022-04-04 DIAGNOSIS — K219 Gastro-esophageal reflux disease without esophagitis: Secondary | ICD-10-CM | POA: Diagnosis not present

## 2022-04-04 DIAGNOSIS — R42 Dizziness and giddiness: Secondary | ICD-10-CM | POA: Diagnosis not present

## 2022-04-04 DIAGNOSIS — G8929 Other chronic pain: Secondary | ICD-10-CM | POA: Diagnosis not present

## 2022-04-04 DIAGNOSIS — N1831 Chronic kidney disease, stage 3a: Secondary | ICD-10-CM | POA: Diagnosis not present

## 2022-04-04 DIAGNOSIS — M25512 Pain in left shoulder: Secondary | ICD-10-CM | POA: Diagnosis not present

## 2022-04-04 DIAGNOSIS — M25511 Pain in right shoulder: Secondary | ICD-10-CM | POA: Diagnosis not present

## 2022-06-06 DIAGNOSIS — Z23 Encounter for immunization: Secondary | ICD-10-CM | POA: Diagnosis not present

## 2022-06-27 ENCOUNTER — Other Ambulatory Visit (HOSPITAL_BASED_OUTPATIENT_CLINIC_OR_DEPARTMENT_OTHER): Payer: Self-pay

## 2022-06-27 DIAGNOSIS — Z23 Encounter for immunization: Secondary | ICD-10-CM | POA: Diagnosis not present

## 2022-06-27 MED ORDER — COMIRNATY 30 MCG/0.3ML IM SUSY
PREFILLED_SYRINGE | INTRAMUSCULAR | 0 refills | Status: AC
Start: 2022-06-27 — End: ?
  Filled 2022-06-27: qty 0.3, 1d supply, fill #0

## 2022-07-26 DIAGNOSIS — Z961 Presence of intraocular lens: Secondary | ICD-10-CM | POA: Diagnosis not present

## 2022-07-26 DIAGNOSIS — H52203 Unspecified astigmatism, bilateral: Secondary | ICD-10-CM | POA: Diagnosis not present

## 2022-07-26 DIAGNOSIS — H04223 Epiphora due to insufficient drainage, bilateral lacrimal glands: Secondary | ICD-10-CM | POA: Diagnosis not present

## 2022-08-16 DIAGNOSIS — C9110 Chronic lymphocytic leukemia of B-cell type not having achieved remission: Secondary | ICD-10-CM | POA: Diagnosis not present

## 2022-08-16 DIAGNOSIS — K219 Gastro-esophageal reflux disease without esophagitis: Secondary | ICD-10-CM | POA: Diagnosis not present

## 2022-08-16 DIAGNOSIS — N1831 Chronic kidney disease, stage 3a: Secondary | ICD-10-CM | POA: Diagnosis not present

## 2022-10-20 ENCOUNTER — Other Ambulatory Visit: Payer: Self-pay

## 2022-10-20 DIAGNOSIS — C9110 Chronic lymphocytic leukemia of B-cell type not having achieved remission: Secondary | ICD-10-CM

## 2022-10-23 ENCOUNTER — Inpatient Hospital Stay: Payer: Medicare Other | Attending: Hematology

## 2022-10-23 ENCOUNTER — Other Ambulatory Visit: Payer: Self-pay

## 2022-10-23 ENCOUNTER — Ambulatory Visit: Payer: Medicare Other | Admitting: Hematology

## 2022-10-23 ENCOUNTER — Other Ambulatory Visit: Payer: Medicare Other

## 2022-10-23 ENCOUNTER — Inpatient Hospital Stay (HOSPITAL_BASED_OUTPATIENT_CLINIC_OR_DEPARTMENT_OTHER): Payer: Medicare Other | Admitting: Hematology

## 2022-10-23 VITALS — BP 149/58 | HR 88 | Temp 97.3°F | Resp 17 | Wt 142.3 lb

## 2022-10-23 DIAGNOSIS — C9110 Chronic lymphocytic leukemia of B-cell type not having achieved remission: Secondary | ICD-10-CM

## 2022-10-23 DIAGNOSIS — Z79899 Other long term (current) drug therapy: Secondary | ICD-10-CM | POA: Diagnosis not present

## 2022-10-23 DIAGNOSIS — Z87891 Personal history of nicotine dependence: Secondary | ICD-10-CM | POA: Insufficient documentation

## 2022-10-23 LAB — CMP (CANCER CENTER ONLY)
ALT: 10 U/L (ref 0–44)
AST: 19 U/L (ref 15–41)
Albumin: 4.4 g/dL (ref 3.5–5.0)
Alkaline Phosphatase: 75 U/L (ref 38–126)
Anion gap: 6 (ref 5–15)
BUN: 28 mg/dL — ABNORMAL HIGH (ref 8–23)
CO2: 28 mmol/L (ref 22–32)
Calcium: 10.3 mg/dL (ref 8.9–10.3)
Chloride: 106 mmol/L (ref 98–111)
Creatinine: 0.88 mg/dL (ref 0.44–1.00)
GFR, Estimated: >60 mL/min (ref >60)
Glucose, Bld: 94 mg/dL (ref 70–99)
Potassium: 3.8 mmol/L (ref 3.5–5.1)
Sodium: 140 mmol/L (ref 135–145)
Total Bilirubin: 0.5 mg/dL (ref 0.3–1.2)
Total Protein: 6.5 g/dL (ref 6.5–8.1)

## 2022-10-23 LAB — CBC WITH DIFFERENTIAL (CANCER CENTER ONLY)
Abs Immature Granulocytes: 0.01 10*3/uL (ref 0.00–0.07)
Basophils Absolute: 0 10*3/uL (ref 0.0–0.1)
Basophils Relative: 0 %
Eosinophils Absolute: 0.1 10*3/uL (ref 0.0–0.5)
Eosinophils Relative: 1 %
HCT: 34.4 % — ABNORMAL LOW (ref 36.0–46.0)
Hemoglobin: 11.5 g/dL — ABNORMAL LOW (ref 12.0–15.0)
Immature Granulocytes: 0 %
Lymphocytes Relative: 53 %
Lymphs Abs: 4.7 10*3/uL — ABNORMAL HIGH (ref 0.7–4.0)
MCH: 31.3 pg (ref 26.0–34.0)
MCHC: 33.4 g/dL (ref 30.0–36.0)
MCV: 93.7 fL (ref 80.0–100.0)
Monocytes Absolute: 0.9 10*3/uL (ref 0.1–1.0)
Monocytes Relative: 10 %
Neutro Abs: 3.2 10*3/uL (ref 1.7–7.7)
Neutrophils Relative %: 36 %
Platelet Count: 237 10*3/uL (ref 150–400)
RBC: 3.67 MIL/uL — ABNORMAL LOW (ref 3.87–5.11)
RDW: 14.4 % (ref 11.5–15.5)
WBC Count: 8.9 10*3/uL (ref 4.0–10.5)
nRBC: 0 % (ref 0.0–0.2)

## 2022-10-23 LAB — LACTATE DEHYDROGENASE: LDH: 124 U/L (ref 98–192)

## 2022-10-23 NOTE — Progress Notes (Signed)
HEMATOLOGY/ONCOLOGY CLINIC NOTE  Date of Service: 10/23/22   Patient Care Team: [name], MD as PCP - General (Internal Medicine) Belva Crome, MD (Inactive) as PCP - Cardiology (Cardiology)  CHIEF COMPLAINTS/PURPOSE OF CONSULTATION:  Follow-up for continued management of CLL HISTORY OF PRESENTING ILLNESS:  Please see previous note for details of initial presentation  INTERVAL HISTORY:  Rebecca King is here accompanied by her husband for follow-up for continued evaluation and management of CLL.  Patient was last seen by me on 10/18/2021 and she complained of mild cognitive decline, but was doing well overall.   Patient is accompanied by her husband. She reports she has been doing well overall without any new medical concerns since our last visit. She denies fever, chills, unexpected weight loss, change in breathing, back pain, chest pain, abnormal bowel movement, new lumps/bumps, or leg swelling. Patient does complain of continuous cognitive decline since our last visit and occasional abdominal pain.    Patient notes she has been eating well, but has lost some weight. She notes she has mild night sweats in the morning.   She notes she had endoscopy since our last visit. The endoscopy did not show any abnormalities.   Patient is taking tramadol for joint pain.   MEDICAL HISTORY:  Past Medical History:  Diagnosis Date   Abnormal nuclear stress test 01/26/2009   POST EXERCISE PVC's NOTED SINCE 2003   CLL (chronic lymphocytic leukemia) ([place]) 10/2009   DR. Yiselle Babich   Colon polyp    HYPERPLASTIC   Diverticulosis    DJD (degenerative joint disease)    GERD (gastroesophageal reflux disease)    Hearing loss    DR. SHOEMAKER   Hypercholesterolemia    Hyperlipidemia    Iron deficiency anemia 05/2010   Lesion of breast    LEFT, SMALL   Microscopic hematuria    DR. PETERSON   Neuropathy    PERIPHERAL   Osteopenia    Renal cyst 10/2009   RENAL CYST ON CT 11/2009   Renal  disease    STAGE 3   Tendon cysts    EXTENSOR 2MM RIGHT FIRST FINGER   Vitamin D deficiency 10/2011  DJD knees Diverticulosis Hearing loss Poor vision CLL March 2011 Microscopic hematuria.  SURGICAL HISTORY: Past Surgical History:  Procedure Laterality Date   ERCP N/A 02/10/2022   Procedure: ENDOSCOPIC RETROGRADE CHOLANGIOPANCREATOGRAPHY (ERCP);  Surgeon: Clarene Essex, MD;  Location: [facility];  Service: Gastroenterology;  Laterality: N/A;   INTRAMEDULLARY (IM) NAIL INTERTROCHANTERIC Left 07/07/2021   Procedure: INTRAMEDULLARY (IM) NAIL INTERTROCHANTRIC;  Surgeon: Paralee Cancel, MD;  Location: [facility];  Service: Orthopedics;  Laterality: Left;   LEFT HEART CATHETERIZATION WITH CORONARY ANGIOGRAM N/A 12/25/2011   Procedure: LEFT HEART CATHETERIZATION WITH CORONARY ANGIOGRAM;  Surgeon: Sinclair Grooms, MD;  Location: [facility] CATH LAB;  Service: Cardiovascular;  Laterality: N/A;   REMOVAL OF STONES  02/10/2022   Procedure: REMOVAL OF STONES;  Surgeon: Clarene Essex, MD;  Location: [facility];  Service: Gastroenterology;;   Joan Mayans  02/10/2022   Procedure: Joan Mayans;  Surgeon: Clarene Essex, MD;  Location: [facility] ENDOSCOPY;  Service: Gastroenterology;;    SOCIAL HISTORY: Social History   Socioeconomic History   Marital status: Married    Spouse name: Not on file   Number of children: 4   Years of education: 17   Highest education level: Bachelor's degree (e.g., BA, AB, BS)  Occupational History   Occupation: UNEMPLOYED  Tobacco Use   Smoking status: Former  Types: Cigarettes    Quit date: 06/25/1980    Years since quitting: 42.3   Smokeless tobacco: Never  Vaping Use   Vaping Use: Never used  Substance and Sexual Activity   Alcohol use: Not Currently   Drug use: No   Sexual activity: Yes    Partners: Male    Birth control/protection: Post-menopausal  Other Topics Concern   Not on file  Social History Narrative   Not on file   Social Determinants of Health    Financial Resource Strain: Not on file  Food Insecurity: Not on file  Transportation Needs: Not on file  Physical Activity: Not on file  Stress: Not on file  Social Connections: Not on file  Intimate Partner Violence: Not on file    FAMILY HISTORY: Family History  Family history unknown: Yes    ALLERGIES:  is allergic to amoxicillin, macrolides and ketolides, metoprolol, ciprofloxacin, macrodantin [nitrofurantoin], and sulfa antibiotics.  MEDICATIONS:  Current Outpatient Medications  Medication Sig Dispense Refill   acetaminophen (TYLENOL) 500 MG tablet Take 500 mg by mouth 2 (two) times daily.     aspirin 81 MG tablet Take 81 mg by mouth daily.     Coenzyme Q10 (CO Q 10 PO) Take 1 tablet by mouth daily.     Collagen-Boron-Hyaluronic Acid (MOVE FREE ULTRA JOINT HEALTH PO) Take 1 tablet by mouth daily.     COVID-19 mRNA vaccine 2023-2024 (COMIRNATY) syringe Inject into the muscle. 0.3 mL 0   Multiple Vitamin (MULITIVITAMIN WITH MINERALS) TABS Take 1 tablet by mouth daily.     pantoprazole (PROTONIX) 40 MG tablet Take 40 mg by mouth daily before supper.     Probiotic Product (ALIGN PO) Take 1 capsule by mouth daily at 12 noon.     rosuvastatin (CRESTOR) 5 MG tablet Take 5 mg by mouth at bedtime.     sertraline (ZOLOFT) 25 MG tablet Take 25 mg by mouth daily. Taking QOD     sucralfate (CARAFATE) 1 g tablet Take 1 g by mouth 2 (two) times daily.     traMADol (ULTRAM) 50 MG tablet Take 50 mg by mouth every 12 (twelve) hours as needed for moderate pain.     No current facility-administered medications for this visit.    REVIEW OF SYSTEMS:   10 Point review of Systems was done is negative except as noted above.  PHYSICAL EXAMINATION: ECOG PERFORMANCE STATUS: 2 - Symptomatic, <50% confined to bed   Vitals:   10/23/22 1359  BP: (!) 149/58  Pulse: 88  Resp: 17  Temp: (!) 97.3 F (36.3 C)  SpO2: 97%    Filed Weights   10/23/22 1359  Weight: 142 lb 5 oz (64.6 kg)     Body mass index is 24.43 kg/m. NAD GENERAL:alert, in no acute distress and comfortable SKIN: no acute rashes, no significant lesions EYES: conjunctiva are pink and non-injected, sclera anicteric OROPHARYNX: MMM, no exudates, no oropharyngeal erythema or ulceration NECK: supple, no JVD LYMPH:  no palpable lymphadenopathy in the cervical, axillary or inguinal regions LUNGS: clear to auscultation b/l with normal respiratory effort HEART: regular rate & rhythm ABDOMEN:  normoactive bowel sounds , non tender, not distended. Extremity: no pedal edema PSYCH: alert & oriented x 3 with fluent speech NEURO: no focal motor/sensory deficits  LABORATORY DATA:  I have reviewed the data as listed  .    Latest Ref Rng & Units 10/23/2022    1:36 PM 02/11/2022    6:02 AM 02/10/2022  12:30 AM  CBC  WBC 4.0 - 10.5 K/uL 8.9  11.2  13.0   Hemoglobin 12.0 - 15.0 g/dL 11.5  9.6  10.2   Hematocrit 36.0 - 46.0 % 34.4  28.5  29.5   Platelets 150 - 400 K/uL 237  272  275    . CBC    Component Value Date/Time   WBC 8.9 10/23/2022 1336   WBC 11.2 (H) 02/11/2022 0602   RBC 3.67 (L) 10/23/2022 1336   HGB 11.5 (L) 10/23/2022 1336   HGB 12.2 04/26/2017 1344   HCT 34.4 (L) 10/23/2022 1336   HCT 36.3 04/26/2017 1344   PLT 237 10/23/2022 1336   PLT 296 04/26/2017 1344   MCV 93.7 10/23/2022 1336   MCV 94.3 04/26/2017 1344   MCH 31.3 10/23/2022 1336   MCHC 33.4 10/23/2022 1336   RDW 14.4 10/23/2022 1336   RDW 15.4 (H) 04/26/2017 1344   LYMPHSABS 4.7 (H) 10/23/2022 1336   LYMPHSABS 7.8 (H) 04/26/2017 1344   MONOABS 0.9 10/23/2022 1336   MONOABS 0.8 04/26/2017 1344   EOSABS 0.1 10/23/2022 1336   EOSABS 0.1 04/26/2017 1344   BASOSABS 0.0 10/23/2022 1336   BASOSABS 0.0 04/26/2017 1344   .    Latest Ref Rng & Units 10/23/2022    1:36 PM 03/03/2022    4:04 PM 02/11/2022    6:02 AM  CMP  Glucose 70 - 99 mg/dL 94  97  108   BUN 8 - 23 mg/dL 28  16  20    Creatinine 0.44 - 1.00 mg/dL 0.88  0.94   1.25   Sodium 135 - 145 mmol/L 140  141  139   Potassium 3.5 - 5.1 mmol/L 3.8  4.1  3.7   Chloride 98 - 111 mmol/L 106  103  108   CO2 22 - 32 mmol/L 28  25  24    Calcium 8.9 - 10.3 mg/dL 10.3  10.3  9.5   Total Protein 6.5 - 8.1 g/dL 6.5   5.2   Total Bilirubin 0.3 - 1.2 mg/dL 0.5   0.9   Alkaline Phos 38 - 126 U/L 75   49   AST 15 - 41 U/L 19   18   ALT 0 - 44 U/L 10   14    . Lab Results  Component Value Date   LDH 124 10/23/2022             RADIOGRAPHIC STUDIES: I have personally reviewed the radiological images as listed and agreed with the findings in the report. No results found.   ASSESSMENT & PLAN:   89 y.o. female with   1) Rai Stage 1 Chronic lymphocytic leukemia. Apparently was previously diagnosed in March 2011 but no data available from this. CLL Fish prognostic panel showed predominantly 13q deletion in 63% of the cells. This typically suggests a good prognosis small clone with trisomy 70 which is an intermediate prognosis.  PLAN: -discussed lab results from today, 10/23/2022, with the patient. CBC shows hemoglobin has improved to 11.5 from 9.6 and hematocrit has improved to 34.4 from 28.5. WBC is in the normal range. CMP is stable.  -Patient's lab does not any sign of progressive CLL. -No indications to initiate treatment of CLL. -Answered all of patient's questions.  -Continue to follow-up with PCP.  FOLLOW-UP: RTC w Dr Irene Limbo with labs in 12 months  F/u with PCP in 6 months   The total time spent in the appointment was 20 minutes* .  All of the patient's questions were answered with apparent satisfaction. The patient knows to call the clinic with any problems, questions or concerns.   Sullivan Lone MD Rebecca AAHIVMS [facility] Hematology/Oncology Physician [facility]  .*Total Encounter Time as defined by the Centers for Medicare and Medicaid Services includes, in addition to the face-to-face time of a patient visit (documented in the  note above) non-face-to-face time: obtaining and reviewing outside history, ordering and reviewing medications, tests or procedures, care coordination (communications with other health care professionals or caregivers) and documentation in the medical record.   I, Cleda Mccreedy, am acting as a Education administrator for Sullivan Lone, MD. .I have reviewed the above documentation for accuracy and completeness, and I agree with the above. Brunetta Genera MD

## 2022-11-02 DIAGNOSIS — H6123 Impacted cerumen, bilateral: Secondary | ICD-10-CM | POA: Diagnosis not present

## 2022-11-24 DIAGNOSIS — J0100 Acute maxillary sinusitis, unspecified: Secondary | ICD-10-CM | POA: Diagnosis not present

## 2022-11-24 DIAGNOSIS — R0789 Other chest pain: Secondary | ICD-10-CM | POA: Diagnosis not present

## 2023-01-10 DIAGNOSIS — H903 Sensorineural hearing loss, bilateral: Secondary | ICD-10-CM | POA: Diagnosis not present

## 2023-02-09 ENCOUNTER — Telehealth: Payer: Self-pay | Admitting: Physician Assistant

## 2023-02-09 DIAGNOSIS — I7121 Aneurysm of the ascending aorta, without rupture: Secondary | ICD-10-CM

## 2023-02-09 DIAGNOSIS — K9089 Other intestinal malabsorption: Secondary | ICD-10-CM | POA: Diagnosis not present

## 2023-02-09 DIAGNOSIS — I700 Atherosclerosis of aorta: Secondary | ICD-10-CM | POA: Diagnosis not present

## 2023-02-09 DIAGNOSIS — I712 Thoracic aortic aneurysm, without rupture, unspecified: Secondary | ICD-10-CM | POA: Diagnosis not present

## 2023-02-09 DIAGNOSIS — C9110 Chronic lymphocytic leukemia of B-cell type not having achieved remission: Secondary | ICD-10-CM | POA: Diagnosis not present

## 2023-02-09 DIAGNOSIS — E7800 Pure hypercholesterolemia, unspecified: Secondary | ICD-10-CM | POA: Diagnosis not present

## 2023-02-09 DIAGNOSIS — B0229 Other postherpetic nervous system involvement: Secondary | ICD-10-CM | POA: Diagnosis not present

## 2023-02-09 DIAGNOSIS — N1831 Chronic kidney disease, stage 3a: Secondary | ICD-10-CM | POA: Diagnosis not present

## 2023-02-09 DIAGNOSIS — F419 Anxiety disorder, unspecified: Secondary | ICD-10-CM | POA: Diagnosis not present

## 2023-02-09 DIAGNOSIS — Z23 Encounter for immunization: Secondary | ICD-10-CM | POA: Diagnosis not present

## 2023-02-09 DIAGNOSIS — L600 Ingrowing nail: Secondary | ICD-10-CM | POA: Diagnosis not present

## 2023-02-09 DIAGNOSIS — R1030 Lower abdominal pain, unspecified: Secondary | ICD-10-CM | POA: Diagnosis not present

## 2023-02-09 DIAGNOSIS — G629 Polyneuropathy, unspecified: Secondary | ICD-10-CM | POA: Diagnosis not present

## 2023-02-09 DIAGNOSIS — Z0000 Encounter for general adult medical examination without abnormal findings: Secondary | ICD-10-CM | POA: Diagnosis not present

## 2023-02-09 DIAGNOSIS — Z79899 Other long term (current) drug therapy: Secondary | ICD-10-CM | POA: Diagnosis not present

## 2023-02-09 NOTE — Telephone Encounter (Signed)
Pt spouse called in to r/s pt's echo. However the order expires on 03/04/23 and next available is 03/06/23. Can a new order be placed please. I informed him I would call him back later today.

## 2023-02-09 NOTE — Telephone Encounter (Signed)
Updated order has been placed will send to scheduling.

## 2023-02-12 ENCOUNTER — Ambulatory Visit (HOSPITAL_COMMUNITY): Payer: Medicare Other

## 2023-02-26 ENCOUNTER — Encounter: Payer: Self-pay | Admitting: Podiatry

## 2023-02-26 ENCOUNTER — Ambulatory Visit: Payer: Medicare Other | Admitting: Podiatry

## 2023-02-26 DIAGNOSIS — L600 Ingrowing nail: Secondary | ICD-10-CM | POA: Diagnosis not present

## 2023-02-26 NOTE — Progress Notes (Signed)
Subjective:   Patient ID: Rebecca King, female   DOB: 89 y.o.   MRN: [id number]   HPI Patient presents ingrown toenail right big toe stating it has been an ongoing thing and giving her problems and she is in good health but it is sore all the time history of inflammation of the forefoot.  Patient does not smoke and tries to be active   Review of Systems  All other systems reviewed and are negative.       Objective:  Physical Exam Vitals and nursing note reviewed.  Constitutional:      Appearance: She is well-developed.  Pulmonary:     Effort: Pulmonary effort is normal.  Musculoskeletal:        General: Normal range of motion.  Skin:    General: Skin is warm.  Neurological:     Mental Status: She is alert.     Neurovascular status found to be intact muscle strength was found to be adequate range of motion adequate incurvated lateral border right big toe sore when pressed with slight redness no active drainage moderate thickness of the nailbed.  Good digital perfusion well-oriented     Assessment:  Ingrown toenail deformity right hallux lateral border with pain and no current indication of infection.  Mild generalized mycotic nail infection      Plan:  H&P reviewed condition discussed treatment options recommended correction of deformity.  Patient wants this done I explained procedure risk and today I infiltrated the right big toe 60 mg like Marcaine mixture sterile prep done using sterile instrumentation I went ahead and I remove the lateral border exposed matrix I applied phenol 3 applications 30 seconds followed by alcohol of sterile dressing gave instructions on soaks and wear dressing for 24 hours take it off earlier if throbbing were to occur and encouraged to call questions concerns

## 2023-02-26 NOTE — Patient Instructions (Signed)

## 2023-03-06 ENCOUNTER — Ambulatory Visit (HOSPITAL_COMMUNITY): Payer: Medicare Other | Attending: Physician Assistant

## 2023-03-06 DIAGNOSIS — I7121 Aneurysm of the ascending aorta, without rupture: Secondary | ICD-10-CM | POA: Diagnosis not present

## 2023-03-06 LAB — ECHOCARDIOGRAM COMPLETE
AR max vel: 2.72 cm2
AV Area VTI: 2.63 cm2
AV Area mean vel: 2.54 cm2
AV Mean grad: 4.5 mmHg
AV Peak grad: 8.2 mmHg
Ao pk vel: 1.44 m/s
Area-P 1/2: 3.31 cm2
MV M vel: 5.09 m/s
MV Peak grad: 103.6 mmHg
S' Lateral: 2.8 cm

## 2023-03-14 DIAGNOSIS — H04222 Epiphora due to insufficient drainage, left lacrimal gland: Secondary | ICD-10-CM | POA: Diagnosis not present

## 2023-03-20 DIAGNOSIS — R319 Hematuria, unspecified: Secondary | ICD-10-CM | POA: Diagnosis not present

## 2023-03-22 DIAGNOSIS — M1712 Unilateral primary osteoarthritis, left knee: Secondary | ICD-10-CM | POA: Diagnosis not present

## 2023-03-29 DIAGNOSIS — M1712 Unilateral primary osteoarthritis, left knee: Secondary | ICD-10-CM | POA: Diagnosis not present

## 2023-04-05 DIAGNOSIS — M1712 Unilateral primary osteoarthritis, left knee: Secondary | ICD-10-CM | POA: Diagnosis not present

## 2023-04-21 DIAGNOSIS — Z23 Encounter for immunization: Secondary | ICD-10-CM | POA: Diagnosis not present

## 2023-04-28 DIAGNOSIS — Z23 Encounter for immunization: Secondary | ICD-10-CM | POA: Diagnosis not present

## 2023-05-11 DIAGNOSIS — N281 Cyst of kidney, acquired: Secondary | ICD-10-CM | POA: Diagnosis not present

## 2023-05-11 DIAGNOSIS — R3129 Other microscopic hematuria: Secondary | ICD-10-CM | POA: Diagnosis not present

## 2023-05-18 DIAGNOSIS — M25511 Pain in right shoulder: Secondary | ICD-10-CM | POA: Diagnosis not present

## 2023-05-18 DIAGNOSIS — M25562 Pain in left knee: Secondary | ICD-10-CM | POA: Diagnosis not present

## 2023-05-18 DIAGNOSIS — R42 Dizziness and giddiness: Secondary | ICD-10-CM | POA: Diagnosis not present

## 2023-05-18 DIAGNOSIS — B0229 Other postherpetic nervous system involvement: Secondary | ICD-10-CM | POA: Diagnosis not present

## 2023-05-18 DIAGNOSIS — K219 Gastro-esophageal reflux disease without esophagitis: Secondary | ICD-10-CM | POA: Diagnosis not present

## 2023-05-18 DIAGNOSIS — R3129 Other microscopic hematuria: Secondary | ICD-10-CM | POA: Diagnosis not present

## 2023-05-18 DIAGNOSIS — R1310 Dysphagia, unspecified: Secondary | ICD-10-CM | POA: Diagnosis not present

## 2023-05-18 DIAGNOSIS — F419 Anxiety disorder, unspecified: Secondary | ICD-10-CM | POA: Diagnosis not present

## 2023-05-18 DIAGNOSIS — C9110 Chronic lymphocytic leukemia of B-cell type not having achieved remission: Secondary | ICD-10-CM | POA: Diagnosis not present

## 2023-05-18 DIAGNOSIS — G8929 Other chronic pain: Secondary | ICD-10-CM | POA: Diagnosis not present

## 2023-05-18 DIAGNOSIS — N1831 Chronic kidney disease, stage 3a: Secondary | ICD-10-CM | POA: Diagnosis not present

## 2023-06-08 DIAGNOSIS — R1032 Left lower quadrant pain: Secondary | ICD-10-CM | POA: Diagnosis not present

## 2023-07-31 DIAGNOSIS — M1712 Unilateral primary osteoarthritis, left knee: Secondary | ICD-10-CM | POA: Diagnosis not present

## 2023-08-13 DIAGNOSIS — M25552 Pain in left hip: Secondary | ICD-10-CM | POA: Diagnosis not present

## 2023-09-07 DIAGNOSIS — S32512A Fracture of superior rim of left pubis, initial encounter for closed fracture: Secondary | ICD-10-CM | POA: Diagnosis not present

## 2023-10-19 ENCOUNTER — Other Ambulatory Visit: Payer: Self-pay

## 2023-10-19 DIAGNOSIS — C9110 Chronic lymphocytic leukemia of B-cell type not having achieved remission: Secondary | ICD-10-CM

## 2023-10-22 ENCOUNTER — Inpatient Hospital Stay: Payer: Medicare Other | Attending: Hematology

## 2023-10-22 ENCOUNTER — Inpatient Hospital Stay (HOSPITAL_BASED_OUTPATIENT_CLINIC_OR_DEPARTMENT_OTHER): Payer: Medicare Other | Admitting: Hematology

## 2023-10-22 VITALS — BP 133/70 | HR 79 | Temp 97.9°F | Resp 18 | Ht 63.5 in | Wt 145.0 lb

## 2023-10-22 DIAGNOSIS — R682 Dry mouth, unspecified: Secondary | ICD-10-CM | POA: Diagnosis not present

## 2023-10-22 DIAGNOSIS — Z881 Allergy status to other antibiotic agents status: Secondary | ICD-10-CM | POA: Diagnosis not present

## 2023-10-22 DIAGNOSIS — R61 Generalized hyperhidrosis: Secondary | ICD-10-CM | POA: Diagnosis not present

## 2023-10-22 DIAGNOSIS — Z79899 Other long term (current) drug therapy: Secondary | ICD-10-CM | POA: Insufficient documentation

## 2023-10-22 DIAGNOSIS — G8929 Other chronic pain: Secondary | ICD-10-CM | POA: Diagnosis not present

## 2023-10-22 DIAGNOSIS — Z880 Allergy status to penicillin: Secondary | ICD-10-CM | POA: Insufficient documentation

## 2023-10-22 DIAGNOSIS — Z87891 Personal history of nicotine dependence: Secondary | ICD-10-CM | POA: Diagnosis not present

## 2023-10-22 DIAGNOSIS — Z860100 Personal history of colon polyps, unspecified: Secondary | ICD-10-CM | POA: Diagnosis not present

## 2023-10-22 DIAGNOSIS — Z882 Allergy status to sulfonamides status: Secondary | ICD-10-CM | POA: Insufficient documentation

## 2023-10-22 DIAGNOSIS — R109 Unspecified abdominal pain: Secondary | ICD-10-CM | POA: Insufficient documentation

## 2023-10-22 DIAGNOSIS — R4189 Other symptoms and signs involving cognitive functions and awareness: Secondary | ICD-10-CM | POA: Diagnosis not present

## 2023-10-22 DIAGNOSIS — M8580 Other specified disorders of bone density and structure, unspecified site: Secondary | ICD-10-CM | POA: Insufficient documentation

## 2023-10-22 DIAGNOSIS — M2550 Pain in unspecified joint: Secondary | ICD-10-CM | POA: Insufficient documentation

## 2023-10-22 DIAGNOSIS — R5383 Other fatigue: Secondary | ICD-10-CM | POA: Insufficient documentation

## 2023-10-22 DIAGNOSIS — C9110 Chronic lymphocytic leukemia of B-cell type not having achieved remission: Secondary | ICD-10-CM

## 2023-10-22 LAB — CMP (CANCER CENTER ONLY)
ALT: 10 U/L (ref 0–44)
AST: 18 U/L (ref 15–41)
Albumin: 4.5 g/dL (ref 3.5–5.0)
Alkaline Phosphatase: 80 U/L (ref 38–126)
Anion gap: 5 (ref 5–15)
BUN: 17 mg/dL (ref 8–23)
CO2: 28 mmol/L (ref 22–32)
Calcium: 10.4 mg/dL — ABNORMAL HIGH (ref 8.9–10.3)
Chloride: 105 mmol/L (ref 98–111)
Creatinine: 0.92 mg/dL (ref 0.44–1.00)
GFR, Estimated: 57 mL/min — ABNORMAL LOW (ref >60)
Glucose, Bld: 95 mg/dL (ref 70–99)
Potassium: 4.5 mmol/L (ref 3.5–5.1)
Sodium: 138 mmol/L (ref 135–145)
Total Bilirubin: 0.5 mg/dL (ref 0.0–1.2)
Total Protein: 6.8 g/dL (ref 6.5–8.1)

## 2023-10-22 LAB — CBC WITH DIFFERENTIAL (CANCER CENTER ONLY)
Abs Immature Granulocytes: 0.03 10*3/uL (ref 0.00–0.07)
Basophils Absolute: 0.1 10*3/uL (ref 0.0–0.1)
Basophils Relative: 0 %
Eosinophils Absolute: 0.1 10*3/uL (ref 0.0–0.5)
Eosinophils Relative: 1 %
HCT: 35.8 % — ABNORMAL LOW (ref 36.0–46.0)
Hemoglobin: 12 g/dL (ref 12.0–15.0)
Immature Granulocytes: 0 %
Lymphocytes Relative: 64 %
Lymphs Abs: 9.6 10*3/uL — ABNORMAL HIGH (ref 0.7–4.0)
MCH: 31.3 pg (ref 26.0–34.0)
MCHC: 33.5 g/dL (ref 30.0–36.0)
MCV: 93.5 fL (ref 80.0–100.0)
Monocytes Absolute: 1.1 10*3/uL — ABNORMAL HIGH (ref 0.1–1.0)
Monocytes Relative: 7 %
Neutro Abs: 4.1 10*3/uL (ref 1.7–7.7)
Neutrophils Relative %: 28 %
Platelet Count: 325 10*3/uL (ref 150–400)
RBC: 3.83 MIL/uL — ABNORMAL LOW (ref 3.87–5.11)
RDW: 14.4 % (ref 11.5–15.5)
Smear Review: NORMAL
WBC Count: 14.9 10*3/uL — ABNORMAL HIGH (ref 4.0–10.5)
nRBC: 0 % (ref 0.0–0.2)

## 2023-10-22 LAB — LACTATE DEHYDROGENASE: LDH: 139 U/L (ref 98–192)

## 2023-10-22 NOTE — Progress Notes (Signed)
 HEMATOLOGY/ONCOLOGY CLINIC NOTE  Date of Service: 10/22/23   Patient Care Team: Emilio Aspen, MD as PCP - General (Internal Medicine) Lyn Records, MD (Inactive) as PCP - Cardiology (Cardiology)  CHIEF COMPLAINTS/PURPOSE OF CONSULTATION:  Follow-up for continued management of CLL HISTORY OF PRESENTING ILLNESS:  Please see previous note for details of initial presentation  INTERVAL HISTORY:  Ms Rebecca King is here accompanied by her husband for follow-up for continued evaluation and management of CLL.  Patient was last seen by me on 10/23/2022 and she complained of continuous cognitive decline, mild weight loss, mild night sweats, and occasional abdominal pain.   Patient is accompanied by her husband during this visit. Patient notes she has been doing well overall since our last visit. She denies any new infection issues, fever, chills, new lumps/bumps, SOB, unexpected weight loss, back pain, chest pain, or leg swelling.   She does complain of chronic joint pain, which she attributes to age and arthritis. She has been taking Tramadol, which control her pain. She also complain of fatigue/lethargy, occasional abnormal bowel movement, and occasionally mild swallowing issues due to dry mouth.    Patient does report of intermittent night sweats and suddenly sweating during the day. She denies drenching night sweats. She is following up with her PCP regarding sudden sweat.   She has discontinued Rosuvastatin and Aspirin.   MEDICAL HISTORY:  Past Medical History:  Diagnosis Date   Abnormal nuclear stress test 01/26/2009   POST EXERCISE PVC's NOTED SINCE 2003   CLL (chronic lymphocytic leukemia) ([facility]) 10/2009   DR. Rosio Weiss   Colon polyp    HYPERPLASTIC   Diverticulosis    DJD (degenerative joint disease)    GERD (gastroesophageal reflux disease)    Hearing loss    DR. SHOEMAKER   Hypercholesterolemia    Hyperlipidemia    Iron deficiency anemia 05/2010   Lesion of breast     LEFT, SMALL   Microscopic hematuria    DR. PETERSON   Neuropathy    PERIPHERAL   Osteopenia    Renal cyst 10/2009   RENAL CYST ON CT 11/2009   Renal disease    STAGE 3   Tendon cysts    EXTENSOR RIGHT FIRST FINGER   Vitamin D deficiency 10/2011  DJD knees Diverticulosis Hearing loss Poor vision CLL March 2011 Microscopic hematuria.  SURGICAL HISTORY: Past Surgical History:  Procedure Laterality Date   ERCP N/A 02/10/2022   Procedure: ENDOSCOPIC RETROGRADE CHOLANGIOPANCREATOGRAPHY (ERCP);  Surgeon: Vida Rigger, MD;  Location: [facility] ENDOSCOPY;  Service: Gastroenterology;  Laterality: N/A;   INTRAMEDULLARY (IM) NAIL INTERTROCHANTERIC Left 07/07/2021   Procedure: INTRAMEDULLARY (IM) NAIL INTERTROCHANTRIC;  Surgeon: Durene Romans, MD;  Location: [facility];  Service: Orthopedics;  Laterality: Left;   LEFT HEART CATHETERIZATION WITH CORONARY ANGIOGRAM N/A 12/25/2011   Procedure: LEFT HEART CATHETERIZATION WITH CORONARY ANGIOGRAM;  Surgeon: Lesleigh Noe, MD;  Location[facility] CATH LAB;  Service: Cardiovascular;  Laterality: N/A;   REMOVAL OF STONES  02/10/2022   Procedure: REMOVAL OF STONES;  Surgeon: Vida Rigger, MD;  Location: [facility] ENDOSCOPY;  Service: Gastroenterology;;   Dennison Mascot  02/10/2022   Procedure: Dennison Mascot;  Surgeon: Vida Rigger, MD;  Location: [facility] ENDOSCOPY;  Service: Gastroenterology;;    SOCIAL HISTORY: Social History   Socioeconomic History   Marital status: Married    Spouse name: Not on file   Number of children: 4   Years of education: 17   Highest education level: Bachelor's degree (e.g., BA,  AB, BS)  Occupational History   Occupation: UNEMPLOYED  Tobacco Use   Smoking status: Former    Current packs/day: 0.00    Types: Cigarettes    Quit date: 06/25/1980    Years since quitting: 43.3   Smokeless tobacco: Never  Vaping Use   Vaping status: Never Used  Substance and Sexual Activity   Alcohol use: Not Currently   Drug use: No   Sexual activity:  Yes    Partners: Male    Birth control/protection: Post-menopausal  Other Topics Concern   Not on file  Social History Narrative   Not on file   Social Drivers of Health   Financial Resource Strain: Not on file  Food Insecurity: Not on file  Transportation Needs: Not on file  Physical Activity: Not on file  Stress: Not on file  Social Connections: Not on file  Intimate Partner Violence: Not on file    FAMILY HISTORY: Family History  Family history unknown: Yes    ALLERGIES:  is allergic to amoxicillin, macrolides and ketolides, metoprolol, ciprofloxacin, macrodantin [nitrofurantoin], and sulfa antibiotics.  MEDICATIONS:  Current Outpatient Medications  Medication Sig Dispense Refill   acetaminophen (TYLENOL) 500 MG tablet Take 500 mg by mouth 2 (two) times daily.     aspirin 81 MG tablet Take 81 mg by mouth daily.     Coenzyme Q10 (CO Q 10 PO) Take 1 tablet by mouth daily.     Collagen-Boron-Hyaluronic Acid (MOVE FREE ULTRA JOINT HEALTH PO) Take 1 tablet by mouth daily.     COVID-19 mRNA vaccine 2023-2024 (COMIRNATY) syringe Inject into the muscle. 0.3 mL 0   Multiple Vitamin (MULITIVITAMIN WITH MINERALS) TABS Take 1 tablet by mouth daily.     pantoprazole (PROTONIX) 40 MG tablet Take 40 mg by mouth daily before supper.     Probiotic Product (ALIGN PO) Take 1 capsule by mouth daily at 12 noon.     rosuvastatin (CRESTOR) 5 MG tablet Take 5 mg by mouth at bedtime.     sertraline (ZOLOFT) 25 MG tablet Take 25 mg by mouth daily. Taking QOD     sucralfate (CARAFATE) 1 g tablet Take 1 g by mouth 2 (two) times daily.     traMADol (ULTRAM) 50 MG tablet Take 50 mg by mouth every 12 (twelve) hours as needed for moderate pain. 1/2 tablet BID as needed     No current facility-administered medications for this visit.    REVIEW OF SYSTEMS:   10 Point review of Systems was done is negative except as noted above.  PHYSICAL EXAMINATION: ECOG PERFORMANCE STATUS: 2 - Symptomatic, <50%  confined to bed   There were no vitals filed for this visit.   Filed Weights   10/22/23 1435  Weight: 145 lb (65.8 kg)   Body mass index is 25.28 kg/m. NAD GENERAL:alert, in no acute distress and comfortable SKIN: no acute rashes, no significant lesions EYES: conjunctiva are pink and non-injected, sclera anicteric OROPHARYNX: MMM, no exudates, no oropharyngeal erythema or ulceration NECK: supple, no JVD LYMPH:  no palpable lymphadenopathy in the cervical, axillary or inguinal regions LUNGS: clear to auscultation b/l with normal respiratory effort HEART: regular rate & rhythm ABDOMEN:  normoactive bowel sounds , non tender, not distended. Extremity: no pedal edema PSYCH: alert & oriented x 3 with fluent speech NEURO: no focal motor/sensory deficits  LABORATORY DATA:  I have reviewed the data as listed  .    Latest Ref Rng & Units 10/22/2023    2:25  PM 10/23/2022    1:36 PM 02/11/2022    6:02 AM  CBC  WBC 4.0 - 10.5 K/uL 14.9  8.9  11.2   Hemoglobin 12.0 - 15.0 g/dL 16.1  09.6  9.6   Hematocrit 36.0 - 46.0 % 35.8  34.4  28.5   Platelets 150 - 400 K/uL 325  237  272    . CBC    Component Value Date/Time   WBC 14.9 (H) 10/22/2023 1425   WBC 11.2 (H) 02/11/2022 0602   RBC 3.83 (L) 10/22/2023 1425   HGB 12.0 10/22/2023 1425   HGB 12.2 04/26/2017 1344   HCT 35.8 (L) 10/22/2023 1425   HCT 36.3 04/26/2017 1344   PLT 325 10/22/2023 1425   PLT 296 04/26/2017 1344   MCV 93.5 10/22/2023 1425   MCV 94.3 04/26/2017 1344   MCH 31.3 10/22/2023 1425   MCHC 33.5 10/22/2023 1425   RDW 14.4 10/22/2023 1425   RDW 15.4 (H) 04/26/2017 1344   LYMPHSABS 9.6 (H) 10/22/2023 1425   LYMPHSABS 7.8 (H) 04/26/2017 1344   MONOABS 1.1 (H) 10/22/2023 1425   MONOABS 0.8 04/26/2017 1344   EOSABS 0.1 10/22/2023 1425   EOSABS 0.1 04/26/2017 1344   BASOSABS 0.1 10/22/2023 1425   BASOSABS 0.0 04/26/2017 1344   .    Latest Ref Rng & Units 10/22/2023    2:25 PM 10/23/2022    1:36 PM 03/03/2022     4:04 PM  CMP  Glucose 70 - 99 mg/dL 95  94  97   BUN 8 - 23 mg/dL 17  28  16    Creatinine 0.44 - 1.00 mg/dL 0.45  4.09  8.11   Sodium 135 - 145 mmol/L 138  140  141   Potassium 3.5 - 5.1 mmol/L 4.5  3.8  4.1   Chloride 98 - 111 mmol/L 105  106  103   CO2 22 - 32 mmol/L 28  28  25    Calcium 8.9 - 10.3 mg/dL 91.4  78.2  95.6   Total Protein 6.5 - 8.1 g/dL 6.8  6.5    Total Bilirubin 0.0 - 1.2 mg/dL 0.5  0.5    Alkaline Phos 38 - 126 U/L 80  75    AST 15 - 41 U/L 18  19    ALT 0 - 44 U/L 10  10     . Lab Results  Component Value Date   LDH 139 10/22/2023             RADIOGRAPHIC STUDIES: I have personally reviewed the radiological images as listed and agreed with the findings in the report. No results found.   ASSESSMENT & PLAN:   89 y.o. female with   1) Rai Stage 1 Chronic lymphocytic leukemia. Apparently was previously diagnosed in March 2011 but no data available from this. CLL Fish prognostic panel showed predominantly 13q deletion in 63% of the cells. This typically suggests a good prognosis small clone with trisomy 42 which is an intermediate prognosis.  PLAN: -Discussed lab results from today, 10/22/2023, in detail with the patient. CBC shows elevated WBC of 14.9 K. CMP stable -Patient's lab does not any sign of progressive CLL. -No indications to initiate treatment of CLL. -Answered all of patient's questions.  -Continue to follow-up with PCP.   FOLLOW-UP: RTC w Dr Candise Che with labs in 12 months  F/u with PCP in 6 months    The total time spent in the appointment was 20 minutes* .  All of  the patient's questions were answered with apparent satisfaction. The patient knows to call the clinic with any problems, questions or concerns.   Wyvonnia Lora MD MS AAHIVMS [facility] Hematology/Oncology Physician [facility]  .*Total Encounter Time as defined by the Centers for Medicare and Medicaid Services includes, in addition to the face-to-face  time of a patient visit (documented in the note above) non-face-to-face time: obtaining and reviewing outside history, ordering and reviewing medications, tests or procedures, care coordination (communications with other health care professionals or caregivers) and documentation in the medical record.   I,Param Shah,acting as a Neurosurgeon for Wyvonnia Lora, MD.,have documented all relevant documentation on the behalf of Wyvonnia Lora, MD,as directed by  Wyvonnia Lora, MD while in the presence of Wyvonnia Lora, MD.   .I have reviewed the above documentation for accuracy and completeness, and I agree with the above. Johney Maine MD

## 2023-10-31 DIAGNOSIS — H04562 Stenosis of left lacrimal punctum: Secondary | ICD-10-CM | POA: Diagnosis not present

## 2023-10-31 DIAGNOSIS — H10412 Chronic giant papillary conjunctivitis, left eye: Secondary | ICD-10-CM | POA: Diagnosis not present

## 2023-11-13 DIAGNOSIS — M1712 Unilateral primary osteoarthritis, left knee: Secondary | ICD-10-CM | POA: Diagnosis not present

## 2023-11-21 DIAGNOSIS — H10413 Chronic giant papillary conjunctivitis, bilateral: Secondary | ICD-10-CM | POA: Diagnosis not present

## 2024-02-06 DIAGNOSIS — H04563 Stenosis of bilateral lacrimal punctum: Secondary | ICD-10-CM | POA: Diagnosis not present

## 2024-02-06 DIAGNOSIS — H10413 Chronic giant papillary conjunctivitis, bilateral: Secondary | ICD-10-CM | POA: Diagnosis not present

## 2024-02-20 DIAGNOSIS — M1712 Unilateral primary osteoarthritis, left knee: Secondary | ICD-10-CM | POA: Diagnosis not present

## 2024-03-05 DIAGNOSIS — H10413 Chronic giant papillary conjunctivitis, bilateral: Secondary | ICD-10-CM | POA: Diagnosis not present

## 2024-04-11 DIAGNOSIS — H10413 Chronic giant papillary conjunctivitis, bilateral: Secondary | ICD-10-CM | POA: Diagnosis not present

## 2024-05-15 DIAGNOSIS — H6121 Impacted cerumen, right ear: Secondary | ICD-10-CM | POA: Diagnosis not present

## 2024-05-15 DIAGNOSIS — H903 Sensorineural hearing loss, bilateral: Secondary | ICD-10-CM | POA: Diagnosis not present

## 2024-05-15 DIAGNOSIS — Z974 Presence of external hearing-aid: Secondary | ICD-10-CM | POA: Diagnosis not present

## 2024-05-30 DIAGNOSIS — K909 Intestinal malabsorption, unspecified: Secondary | ICD-10-CM | POA: Diagnosis not present

## 2024-05-30 DIAGNOSIS — Z0000 Encounter for general adult medical examination without abnormal findings: Secondary | ICD-10-CM | POA: Diagnosis not present

## 2024-05-30 DIAGNOSIS — N1831 Chronic kidney disease, stage 3a: Secondary | ICD-10-CM | POA: Diagnosis not present

## 2024-05-30 DIAGNOSIS — F419 Anxiety disorder, unspecified: Secondary | ICD-10-CM | POA: Diagnosis not present

## 2024-05-30 DIAGNOSIS — C9110 Chronic lymphocytic leukemia of B-cell type not having achieved remission: Secondary | ICD-10-CM | POA: Diagnosis not present

## 2024-05-30 DIAGNOSIS — R3129 Other microscopic hematuria: Secondary | ICD-10-CM | POA: Diagnosis not present

## 2024-05-30 DIAGNOSIS — E7800 Pure hypercholesterolemia, unspecified: Secondary | ICD-10-CM | POA: Diagnosis not present

## 2024-05-30 DIAGNOSIS — Z79899 Other long term (current) drug therapy: Secondary | ICD-10-CM | POA: Diagnosis not present

## 2024-05-30 DIAGNOSIS — G8929 Other chronic pain: Secondary | ICD-10-CM | POA: Diagnosis not present

## 2024-05-30 DIAGNOSIS — M25562 Pain in left knee: Secondary | ICD-10-CM | POA: Diagnosis not present

## 2024-05-30 DIAGNOSIS — B0229 Other postherpetic nervous system involvement: Secondary | ICD-10-CM | POA: Diagnosis not present

## 2024-05-30 DIAGNOSIS — Z23 Encounter for immunization: Secondary | ICD-10-CM | POA: Diagnosis not present

## 2024-06-16 DIAGNOSIS — D72829 Elevated white blood cell count, unspecified: Secondary | ICD-10-CM | POA: Diagnosis not present

## 2024-06-18 ENCOUNTER — Telehealth: Payer: Self-pay | Admitting: Hematology

## 2024-06-25 ENCOUNTER — Encounter: Payer: Self-pay | Admitting: Internal Medicine

## 2024-06-26 ENCOUNTER — Inpatient Hospital Stay: Admitting: Hematology

## 2024-06-26 ENCOUNTER — Inpatient Hospital Stay

## 2024-06-30 ENCOUNTER — Other Ambulatory Visit: Payer: Self-pay

## 2024-06-30 DIAGNOSIS — C9110 Chronic lymphocytic leukemia of B-cell type not having achieved remission: Secondary | ICD-10-CM

## 2024-07-01 ENCOUNTER — Inpatient Hospital Stay: Attending: Hematology

## 2024-07-01 ENCOUNTER — Inpatient Hospital Stay (HOSPITAL_BASED_OUTPATIENT_CLINIC_OR_DEPARTMENT_OTHER): Admitting: Hematology

## 2024-07-01 VITALS — BP 137/65 | HR 72 | Temp 97.3°F | Resp 18 | Wt 149.4 lb

## 2024-07-01 DIAGNOSIS — Z860100 Personal history of colon polyps, unspecified: Secondary | ICD-10-CM | POA: Diagnosis not present

## 2024-07-01 DIAGNOSIS — Z881 Allergy status to other antibiotic agents status: Secondary | ICD-10-CM | POA: Diagnosis not present

## 2024-07-01 DIAGNOSIS — Z882 Allergy status to sulfonamides status: Secondary | ICD-10-CM | POA: Diagnosis not present

## 2024-07-01 DIAGNOSIS — Z79899 Other long term (current) drug therapy: Secondary | ICD-10-CM | POA: Insufficient documentation

## 2024-07-01 DIAGNOSIS — Z7982 Long term (current) use of aspirin: Secondary | ICD-10-CM | POA: Insufficient documentation

## 2024-07-01 DIAGNOSIS — R4189 Other symptoms and signs involving cognitive functions and awareness: Secondary | ICD-10-CM | POA: Insufficient documentation

## 2024-07-01 DIAGNOSIS — D692 Other nonthrombocytopenic purpura: Secondary | ICD-10-CM | POA: Insufficient documentation

## 2024-07-01 DIAGNOSIS — M1990 Unspecified osteoarthritis, unspecified site: Secondary | ICD-10-CM | POA: Diagnosis not present

## 2024-07-01 DIAGNOSIS — C9110 Chronic lymphocytic leukemia of B-cell type not having achieved remission: Secondary | ICD-10-CM | POA: Diagnosis not present

## 2024-07-01 DIAGNOSIS — D649 Anemia, unspecified: Secondary | ICD-10-CM | POA: Insufficient documentation

## 2024-07-01 DIAGNOSIS — G8929 Other chronic pain: Secondary | ICD-10-CM | POA: Insufficient documentation

## 2024-07-01 DIAGNOSIS — Z87891 Personal history of nicotine dependence: Secondary | ICD-10-CM | POA: Diagnosis not present

## 2024-07-01 DIAGNOSIS — R109 Unspecified abdominal pain: Secondary | ICD-10-CM | POA: Insufficient documentation

## 2024-07-01 DIAGNOSIS — R61 Generalized hyperhidrosis: Secondary | ICD-10-CM | POA: Diagnosis not present

## 2024-07-01 DIAGNOSIS — Z880 Allergy status to penicillin: Secondary | ICD-10-CM | POA: Insufficient documentation

## 2024-07-01 LAB — CBC WITH DIFFERENTIAL (CANCER CENTER ONLY)
Abs Immature Granulocytes: 0.03 K/uL (ref 0.00–0.07)
Basophils Absolute: 0.1 K/uL (ref 0.0–0.1)
Basophils Relative: 0 %
Eosinophils Absolute: 0.1 K/uL (ref 0.0–0.5)
Eosinophils Relative: 1 %
HCT: 34.5 % — ABNORMAL LOW (ref 36.0–46.0)
Hemoglobin: 11.5 g/dL — ABNORMAL LOW (ref 12.0–15.0)
Immature Granulocytes: 0 %
Lymphocytes Relative: 68 %
Lymphs Abs: 9.2 K/uL — ABNORMAL HIGH (ref 0.7–4.0)
MCH: 31.3 pg (ref 26.0–34.0)
MCHC: 33.3 g/dL (ref 30.0–36.0)
MCV: 93.8 fL (ref 80.0–100.0)
Monocytes Absolute: 0.9 K/uL (ref 0.1–1.0)
Monocytes Relative: 6 %
Neutro Abs: 3.4 K/uL (ref 1.7–7.7)
Neutrophils Relative %: 25 %
Platelet Count: 289 K/uL (ref 150–400)
RBC: 3.68 MIL/uL — ABNORMAL LOW (ref 3.87–5.11)
RDW: 14.1 % (ref 11.5–15.5)
Smear Review: NORMAL
WBC Count: 13.6 K/uL — ABNORMAL HIGH (ref 4.0–10.5)
nRBC: 0 % (ref 0.0–0.2)

## 2024-07-01 LAB — CMP (CANCER CENTER ONLY)
ALT: 9 U/L (ref 0–44)
AST: 25 U/L (ref 15–41)
Albumin: 4.4 g/dL (ref 3.5–5.0)
Alkaline Phosphatase: 78 U/L (ref 38–126)
Anion gap: 9 (ref 5–15)
BUN: 16 mg/dL (ref 8–23)
CO2: 26 mmol/L (ref 22–32)
Calcium: 11 mg/dL — ABNORMAL HIGH (ref 8.9–10.3)
Chloride: 102 mmol/L (ref 98–111)
Creatinine: 0.96 mg/dL (ref 0.44–1.00)
GFR, Estimated: 54 mL/min — ABNORMAL LOW (ref >60)
Glucose, Bld: 95 mg/dL (ref 70–99)
Potassium: 5.1 mmol/L (ref 3.5–5.1)
Sodium: 137 mmol/L (ref 135–145)
Total Bilirubin: 0.4 mg/dL (ref 0.0–1.2)
Total Protein: 6.7 g/dL (ref 6.5–8.1)

## 2024-07-01 LAB — LACTATE DEHYDROGENASE: LDH: 154 U/L (ref 105–235)

## 2024-07-01 NOTE — Progress Notes (Signed)
 HEMATOLOGY ONCOLOGY PROGRESS NOTE  Date of service: 07/01/2024  Patient Care Team: Charlott Dorn LABOR, MD as PCP - General (Internal Medicine) Claudene Victory ORN, MD (Inactive) as PCP - Cardiology (Cardiology)  CHIEF COMPLAINT/PURPOSE OF CONSULTATION: Follow-up for continued evaluation and management of CLL.  HISTORY OF PRESENTING ILLNESS: Please see previous note for details of initial presentation.   SUMMARY OF ONCOLOGIC HISTORY: Oncology History   No history exists.    INTERVAL HISTORY: Rebecca King is a 89 y.o. female who is here today for continued evaluation and management of CLL. She is accompanied by her husband today.   she was last seen by me on 10/22/2023; at the time she complained of chronic joint pain, continuous cognitive decline, mild night sweats, mild weight loss and occasional abdominal pain.   Today, she complains about random perspiration. She says this is intermittent and only happens during the day time. She also reports some senile purpura in her wrists and calves. She rests more recently and is not as active as she was in past years. She takes 3 x 50mg  pills of tramadol  per day and this helps her chronic joint pain. She is no longer taking aspirin . She is still taking Crestore for her cholesterol. She denies any fevers/chills, or drenching night sweats.   REVIEW OF SYSTEMS:   10 Point review of systems of done and is negative except as noted above.  MEDICAL HISTORY Past Medical History:  Diagnosis Date   Abnormal nuclear stress test 01/26/2009   POST EXERCISE PVC's NOTED SINCE 2003   CLL (chronic lymphocytic leukemia) (HCC) 10/2009   DR. Lillyahna Hemberger   Colon polyp    HYPERPLASTIC   Diverticulosis    DJD (degenerative joint disease)    GERD (gastroesophageal reflux disease)    Hearing loss    DR. SHOEMAKER   Hypercholesterolemia    Hyperlipidemia    Iron deficiency anemia 05/2010   Lesion of breast    LEFT, SMALL   Microscopic hematuria    DR.  PETERSON   Neuropathy    PERIPHERAL   Osteopenia    Renal cyst 10/2009   RENAL CYST ON CT 11/2009   Renal disease    STAGE 3   Tendon cysts    EXTENSOR RIGHT FIRST FINGER   Vitamin D  deficiency 10/2011    SURGICAL HISTORY Past Surgical History:  Procedure Laterality Date   ERCP N/A 02/10/2022   Procedure: ENDOSCOPIC RETROGRADE CHOLANGIOPANCREATOGRAPHY (ERCP);  Surgeon: Rosalie Kitchens, MD;  Location: [facility] ENDOSCOPY;  Service: Gastroenterology;  Laterality: N/A;   INTRAMEDULLARY (IM) NAIL INTERTROCHANTERIC Left 07/07/2021   Procedure: INTRAMEDULLARY (IM) NAIL INTERTROCHANTRIC;  Surgeon: Ernie Cough, MD;  Location: [facility] ORS;  Service: Orthopedics;  Laterality: Left;   LEFT HEART CATHETERIZATION WITH CORONARY ANGIOGRAM N/A 12/25/2011   Procedure: LEFT HEART CATHETERIZATION WITH CORONARY ANGIOGRAM;  Surgeon: Victory ORN Claudene DOUGLAS, MD;  Location: [facility] CATH LAB;  Service: Cardiovascular;  Laterality: N/A;   REMOVAL OF STONES  02/10/2022   Procedure: REMOVAL OF STONES;  Surgeon: Rosalie Kitchens, MD;  Location: [facility] ENDOSCOPY;  Service: Gastroenterology;;   ANNETT  02/10/2022   Procedure: ANNETT;  Surgeon: Rosalie Kitchens, MD;  Location: [facility] ENDOSCOPY;  Service: Gastroenterology;;    SOCIAL HISTORY Social History   Tobacco Use   Smoking status: Former    Current packs/day: 0.00    Types: Cigarettes    Quit date: 06/25/1980    Years since quitting: 44.0   Smokeless tobacco: Never  Vaping Use   Vaping  status: Never Used  Substance Use Topics   Alcohol use: Not Currently   Drug use: No    Social History   Social History Narrative   Not on file    SOCIAL DRIVERS OF HEALTH SDOH Screenings   Tobacco Use: Medium Risk (05/15/2024)   Received from [facility]     FAMILY HISTORY Family History  Family history unknown: Yes     ALLERGIES: is allergic to amoxicillin , macrolides and ketolides, metoprolol , ciprofloxacin, macrodantin [nitrofurantoin], and sulfa  antibiotics.  MEDICATIONS  Current Outpatient Medications  Medication Sig Dispense Refill   acetaminophen  (TYLENOL ) 500 MG tablet Take 500 mg by mouth 2 (two) times daily.     aspirin  81 MG tablet Take 81 mg by mouth daily.     Coenzyme Q10 (CO Q 10 PO) Take 1 tablet by mouth daily.     Collagen-Boron-Hyaluronic Acid (MOVE FREE ULTRA JOINT HEALTH PO) Take 1 tablet by mouth daily.     COVID-19 mRNA vaccine 2023-2024 (COMIRNATY ) syringe Inject into the muscle. 0.3 mL 0   Multiple Vitamin (MULITIVITAMIN WITH MINERALS) TABS Take 1 tablet by mouth daily.     pantoprazole  (PROTONIX ) 40 MG tablet Take 40 mg by mouth daily before supper.     Probiotic Product (ALIGN PO) Take 1 capsule by mouth daily at 12 noon.     rosuvastatin  (CRESTOR ) 5 MG tablet Take 5 mg by mouth at bedtime.     sertraline  (ZOLOFT ) 25 MG tablet Take 25 mg by mouth daily. Taking QOD     sucralfate  (CARAFATE ) 1 g tablet Take 1 g by mouth 2 (two) times daily.     traMADol  (ULTRAM ) 50 MG tablet Take 50 mg by mouth every 12 (twelve) hours as needed for moderate pain. 1/2 tablet BID as needed     No current facility-administered medications for this visit.    PHYSICAL EXAMINATION: ECOG PERFORMANCE STATUS: 1 - Symptomatic but completely ambulatory VITALS: Vitals:   07/01/24 1158  BP: 137/65  Pulse: 72  Resp: 18  Temp: (!) 97.3 F (36.3 C)  SpO2: 95%   Filed Weights   07/01/24 1158  Weight: 149 lb 6.4 oz (67.8 kg)   Body mass index is 26.05 kg/m.  GENERAL: alert, in no acute distress and comfortable SKIN: no acute rashes, no significant lesions EYES: conjunctiva are pink and non-injected, sclera anicteric OROPHARYNX: MMM, no exudates, no oropharyngeal erythema or ulceration NECK: supple, no JVD LYMPH:  no palpable lymphadenopathy in the cervical, axillary or inguinal regions LUNGS: clear to auscultation b/l with normal respiratory effort HEART: regular rate & rhythm ABDOMEN:  normoactive bowel sounds , non  tender, not distended, no hepatosplenomegaly Extremity: no pedal edema PSYCH: alert & oriented x 3 with fluent speech NEURO: no focal motor/sensory deficits  LABORATORY DATA:   I have reviewed the data as listed     Latest Ref Rng & Units 07/01/2024   11:21 AM 10/22/2023    2:25 PM 10/23/2022    1:36 PM  CBC EXTENDED  WBC 4.0 - 10.5 K/uL 13.6  14.9  8.9   RBC 3.87 - 5.11 MIL/uL 3.68  3.83  3.67   Hemoglobin 12.0 - 15.0 g/dL 88.4  87.9  88.4   HCT 36.0 - 46.0 % 34.5  35.8  34.4   Platelets 150 - 400 K/uL 289  325  237   NEUT# 1.7 - 7.7 K/uL 3.4  4.1  3.2   Lymph# 0.7 - 4.0 K/uL 9.2  9.6  4.7  Latest Ref Rng & Units 07/01/2024   11:21 AM 10/22/2023    2:25 PM 10/23/2022    1:36 PM  CMP  Glucose 70 - 99 mg/dL 95  95  94   BUN 8 - 23 mg/dL 16  17  28    Creatinine 0.44 - 1.00 mg/dL 9.03  9.07  9.11   Sodium 135 - 145 mmol/L 137  138  140   Potassium 3.5 - 5.1 mmol/L 5.1  4.5  3.8   Chloride 98 - 111 mmol/L 102  105  106   CO2 22 - 32 mmol/L 26  28  28    Calcium  8.9 - 10.3 mg/dL 88.9  89.5  89.6   Total Protein 6.5 - 8.1 g/dL 6.7  6.8  6.5   Total Bilirubin 0.0 - 1.2 mg/dL 0.4  0.5  0.5   Alkaline Phos 38 - 126 U/L 78  80  75   AST 15 - 41 U/L 25  18  19    ALT 0 - 44 U/L 9  10  10                 RADIOGRAPHIC STUDIES: I have personally reviewed the radiological images as listed and agreed with the findings in the report. No results found.  ASSESSMENT & PLAN:  89 y.o. female with  1) Rai Stage 1 Chronic lymphocytic leukemia. Apparently was previously diagnosed in March 2011 but no data available from this. CLL Fish prognostic panel showed predominantly 13q deletion in 63% of the cells. This typically suggests a good prognosis small clone with trisomy 52 which is an intermediate prognosis.    PLAN: - Discussed lab results on 07/01/2024 in detail with patient: -WBC nearly 14k  -minimal anemia, hemoglobin 11.5-12 -Platelets are normal at 289k -Will continue  taking tramadol  and crestor   -no evidence for symptomatic progression of CLL since last visit. No indication for treatment of patients CLL at this time. -Follow-up in 1 year  FOLLOW-UP in 12 months for labs and follow-up with Dr. Onesimo.  The total time spent in the appointment was 20 minutes* .  All of the patient's questions were answered and the patient knows to call the clinic with any problems, questions, or concerns.  Emaline Onesimo MD MS AAHIVMS [facility] Hematology/Oncology Physician [facility]  *Total Encounter Time as defined by the Centers for Medicare and Medicaid Services includes, in addition to the face-to-face time of a patient visit (documented in the note above) non-face-to-face time: obtaining and reviewing outside history, ordering and reviewing medications, tests or procedures, care coordination (communications with other health care professionals or caregivers) and documentation in the medical record.  I, Alan Blowers, acting as a neurosurgeon for Emaline Onesimo, MD.,have documented all relevant documentation on the behalf of Emaline Onesimo, MD,as directed by  Emaline Onesimo, MD while in the presence of Emaline Onesimo, MD.  I have reviewed the above documentation for accuracy and completeness, and I agree with the above.  Ameerah Huffstetler, MD

## 2024-07-01 NOTE — Progress Notes (Signed)
 SABRA

## 2024-10-21 ENCOUNTER — Inpatient Hospital Stay: Attending: Hematology

## 2024-10-21 ENCOUNTER — Inpatient Hospital Stay: Admitting: Hematology

## 2025-07-01 ENCOUNTER — Inpatient Hospital Stay

## 2025-07-01 ENCOUNTER — Inpatient Hospital Stay: Admitting: Hematology
